# Patient Record
Sex: Male | Born: 1970 | Race: Black or African American | Hispanic: No | Marital: Single | Smoking: Never smoker
Health system: Southern US, Community
[De-identification: ages and names within clinical notes are randomized; demographics above are authoritative.]

## PROBLEM LIST (undated history)

## (undated) DIAGNOSIS — N189 Chronic kidney disease, unspecified: Secondary | ICD-10-CM

## (undated) DIAGNOSIS — I10 Essential (primary) hypertension: Secondary | ICD-10-CM

## (undated) DIAGNOSIS — N289 Disorder of kidney and ureter, unspecified: Secondary | ICD-10-CM

## (undated) HISTORY — PX: HERNIA REPAIR: SHX51

---

## 2006-06-18 ENCOUNTER — Emergency Department (HOSPITAL_COMMUNITY): Admission: EM | Admit: 2006-06-18 | Discharge: 2006-06-18 | Payer: Self-pay | Admitting: Emergency Medicine

## 2020-03-14 ENCOUNTER — Emergency Department (HOSPITAL_COMMUNITY): Payer: 59

## 2020-03-14 ENCOUNTER — Other Ambulatory Visit: Payer: Self-pay

## 2020-03-14 ENCOUNTER — Inpatient Hospital Stay (HOSPITAL_COMMUNITY)
Admission: EM | Admit: 2020-03-14 | Discharge: 2020-03-17 | DRG: 177 | Disposition: A | Discharge status: Home / Self Care | Payer: 59 | Attending: Family Medicine | Admitting: Family Medicine

## 2020-03-14 ENCOUNTER — Encounter (HOSPITAL_COMMUNITY): Payer: Self-pay | Admitting: Emergency Medicine

## 2020-03-14 DIAGNOSIS — U071 COVID-19: Principal | ICD-10-CM

## 2020-03-14 DIAGNOSIS — R0902 Hypoxemia: Secondary | ICD-10-CM

## 2020-03-14 DIAGNOSIS — R490 Dysphonia: Secondary | ICD-10-CM | POA: Diagnosis present

## 2020-03-14 DIAGNOSIS — J1282 Pneumonia due to coronavirus disease 2019: Secondary | ICD-10-CM | POA: Diagnosis not present

## 2020-03-14 DIAGNOSIS — Z8249 Family history of ischemic heart disease and other diseases of the circulatory system: Secondary | ICD-10-CM | POA: Diagnosis not present

## 2020-03-14 DIAGNOSIS — J9601 Acute respiratory failure with hypoxia: Secondary | ICD-10-CM | POA: Diagnosis present

## 2020-03-14 DIAGNOSIS — I10 Essential (primary) hypertension: Secondary | ICD-10-CM | POA: Diagnosis present

## 2020-03-14 DIAGNOSIS — N179 Acute kidney failure, unspecified: Secondary | ICD-10-CM | POA: Diagnosis present

## 2020-03-14 DIAGNOSIS — J069 Acute upper respiratory infection, unspecified: Secondary | ICD-10-CM | POA: Diagnosis not present

## 2020-03-14 HISTORY — DX: Pneumonia due to coronavirus disease 2019: J12.82

## 2020-03-14 HISTORY — DX: COVID-19: U07.1

## 2020-03-14 LAB — COMPREHENSIVE METABOLIC PANEL
ALT: 58 U/L — ABNORMAL HIGH (ref 0–44)
AST: 42 U/L — ABNORMAL HIGH (ref 15–41)
Albumin: 3.3 g/dL — ABNORMAL LOW (ref 3.5–5.0)
Alkaline Phosphatase: 39 U/L (ref 38–126)
Anion gap: 11 (ref 5–15)
BUN: 24 mg/dL — ABNORMAL HIGH (ref 6–20)
CO2: 25 mmol/L (ref 22–32)
Calcium: 8.6 mg/dL — ABNORMAL LOW (ref 8.9–10.3)
Chloride: 98 mmol/L (ref 98–111)
Creatinine, Ser: 1.41 mg/dL — ABNORMAL HIGH (ref 0.61–1.24)
GFR, Estimated: >60 mL/min (ref >60)
Glucose, Bld: 114 mg/dL — ABNORMAL HIGH (ref 70–99)
Potassium: 3.9 mmol/L (ref 3.5–5.1)
Sodium: 134 mmol/L — ABNORMAL LOW (ref 135–145)
Total Bilirubin: 0.7 mg/dL (ref 0.3–1.2)
Total Protein: 7.4 g/dL (ref 6.5–8.1)

## 2020-03-14 LAB — CBC WITH DIFFERENTIAL/PLATELET
Basophils Absolute: 0.1 10*3/uL (ref 0.0–0.1)
Basophils Relative: 1 %
Eosinophils Absolute: 0 10*3/uL (ref 0.0–0.5)
Eosinophils Relative: 0 %
HCT: 46 % (ref 39.0–52.0)
Hemoglobin: 14.9 g/dL (ref 13.0–17.0)
Lymphocytes Relative: 14 %
Lymphs Abs: 1.6 10*3/uL (ref 0.7–4.0)
MCH: 28.9 pg (ref 26.0–34.0)
MCHC: 32.4 g/dL (ref 30.0–36.0)
MCV: 89.1 fL (ref 80.0–100.0)
Monocytes Absolute: 0.7 10*3/uL (ref 0.1–1.0)
Monocytes Relative: 6 %
Neutro Abs: 8.4 10*3/uL — ABNORMAL HIGH (ref 1.7–7.7)
Neutrophils Relative %: 77 %
Platelets: 259 10*3/uL (ref 150–400)
RBC: 5.16 MIL/uL (ref 4.22–5.81)
RDW: 12.7 % (ref 11.5–15.5)
WBC: 10.9 10*3/uL — ABNORMAL HIGH (ref 4.0–10.5)
nRBC: 0 % (ref 0.0–0.2)
nRBC: 0 /100 WBC

## 2020-03-14 LAB — FERRITIN: Ferritin: 834 ng/mL — ABNORMAL HIGH (ref 24–336)

## 2020-03-14 LAB — PROCALCITONIN: Procalcitonin: <0.1 ng/mL

## 2020-03-14 LAB — C-REACTIVE PROTEIN: CRP: 8.1 mg/dL — ABNORMAL HIGH (ref <1.0)

## 2020-03-14 LAB — BRAIN NATRIURETIC PEPTIDE: B Natriuretic Peptide: 116 pg/mL — ABNORMAL HIGH (ref 0.0–100.0)

## 2020-03-14 LAB — TRIGLYCERIDES: Triglycerides: 105 mg/dL (ref <150)

## 2020-03-14 LAB — RESP PANEL BY RT-PCR (FLU A&B, COVID) ARPGX2
Influenza A by PCR: NEGATIVE
Influenza B by PCR: NEGATIVE
SARS Coronavirus 2 by RT PCR: POSITIVE — AB

## 2020-03-14 LAB — LACTATE DEHYDROGENASE: LDH: 440 U/L — ABNORMAL HIGH (ref 98–192)

## 2020-03-14 LAB — FIBRINOGEN: Fibrinogen: >800 mg/dL — ABNORMAL HIGH (ref 210–475)

## 2020-03-14 LAB — LACTIC ACID, PLASMA: Lactic Acid, Venous: 0.9 mmol/L (ref 0.5–1.9)

## 2020-03-14 LAB — D-DIMER, QUANTITATIVE (NOT AT ARMC): D-Dimer, Quant: 2.86 ug/mL-FEU — ABNORMAL HIGH (ref 0.00–0.50)

## 2020-03-14 MED ORDER — ONDANSETRON HCL 4 MG/2ML IJ SOLN: 4.0000 mg | Freq: Four times a day (QID) | INTRAMUSCULAR | Status: DC | PRN

## 2020-03-14 MED ORDER — GUAIFENESIN-DM 100-10 MG/5ML PO SYRP
10.0000 mL | ORAL_SOLUTION | ORAL | Status: DC | PRN
  Administered 2020-03-14: 10 mL via ORAL
  Filled 2020-03-14: qty 10

## 2020-03-14 MED ORDER — ONDANSETRON HCL 4 MG PO TABS: 4.0000 mg | ORAL_TABLET | Freq: Four times a day (QID) | ORAL | Status: DC | PRN

## 2020-03-14 MED ORDER — SODIUM CHLORIDE 0.9 % IV SOLN
100.0000 mg | INTRAVENOUS | Status: AC
  Administered 2020-03-14 (×2): 100 mg via INTRAVENOUS
  Filled 2020-03-14 (×2): qty 20

## 2020-03-14 MED ORDER — SODIUM CHLORIDE 0.9 % IV SOLN
100.0000 mg | Freq: Every day | INTRAVENOUS | Status: DC
  Administered 2020-03-15 – 2020-03-17 (×3): 100 mg via INTRAVENOUS
  Filled 2020-03-14 (×3): qty 20

## 2020-03-14 MED ORDER — ZINC SULFATE 220 (50 ZN) MG PO CAPS
220.0000 mg | ORAL_CAPSULE | Freq: Every day | ORAL | Status: DC
  Administered 2020-03-14 – 2020-03-17 (×4): 220 mg via ORAL
  Filled 2020-03-14 (×4): qty 1

## 2020-03-14 MED ORDER — ASCORBIC ACID 500 MG PO TABS
500.0000 mg | ORAL_TABLET | Freq: Every day | ORAL | Status: DC
  Administered 2020-03-14 – 2020-03-17 (×4): 500 mg via ORAL
  Filled 2020-03-14 (×4): qty 1

## 2020-03-14 MED ORDER — ENOXAPARIN SODIUM 40 MG/0.4ML ~~LOC~~ SOLN
40.0000 mg | SUBCUTANEOUS | Status: DC
  Administered 2020-03-14 – 2020-03-16 (×3): 40 mg via SUBCUTANEOUS
  Filled 2020-03-14 (×3): qty 0.4

## 2020-03-14 MED ORDER — METHYLPREDNISOLONE SODIUM SUCC 40 MG IJ SOLR
1.0000 mg/kg | Freq: Once | INTRAMUSCULAR | Status: AC
  Administered 2020-03-14: 104 mg via INTRAVENOUS
  Filled 2020-03-14: qty 3

## 2020-03-14 MED ORDER — DEXAMETHASONE SODIUM PHOSPHATE 10 MG/ML IJ SOLN
6.0000 mg | INTRAMUSCULAR | Status: DC
  Administered 2020-03-15 – 2020-03-17 (×3): 6 mg via INTRAVENOUS
  Filled 2020-03-14 (×3): qty 1

## 2020-03-14 MED ORDER — ALBUTEROL SULFATE HFA 108 (90 BASE) MCG/ACT IN AERS
2.0000 | INHALATION_SPRAY | Freq: Four times a day (QID) | RESPIRATORY_TRACT | Status: DC
  Administered 2020-03-14 – 2020-03-16 (×9): 2 via RESPIRATORY_TRACT
  Filled 2020-03-14: qty 6.7

## 2020-03-14 MED ORDER — POLYETHYLENE GLYCOL 3350 17 G PO PACK: 17.0000 g | PACK | Freq: Every day | ORAL | Status: DC | PRN

## 2020-03-14 NOTE — ED Provider Notes (Signed)
[facility] EMERGENCY DEPARTMENT Provider Note   CSN: [id number] Arrival date & time: 03/14/20  0900     History Chief Complaint  Patient presents with  . Shortness of Breath    Tony Meyer is a 49 y.o. male presenting for evaluation of shortness of breath.  Patient states for the past week, he has had gradually worsening shortness of breath.  He is not short of breath over time, significantly worse with exertion.  He reports feeling that his mouth is dry and that his voice is hoarse.  Initially he had chills, but these have resolved.  No fevers, chest pain, nausea, vomiting, abd pain, urinary symptoms, normal bowel movements.  He reports a mild cough, productive of yellow and occasionally bloody sputum.  He denies recent travel, surgeries, immobilization, history of cancer, history previous DVT/PE, or hormone use.  He was seen at urgent care 3 days ago, prescribed prednisone, albuterol, and antibiotic.  Has been taking it for 2 days with continued worsening symptoms.  He has no medical problems, takes no medications daily.  No history of asthma or COPD.  He does not smoke cigarettes, denies alcohol or drug use.  He did not receive his Covid vaccines.  He denies sick contacts.  HPI     History reviewed. No pertinent past medical history.  There are no problems to display for this patient.   Past Surgical History:  Procedure Laterality Date  . HERNIA REPAIR         History reviewed. No pertinent family history.  Social History   Tobacco Use  . Smoking status: Never Smoker  . Smokeless tobacco: Never Used  Vaping Use  . Vaping Use: Never used  Substance Use Topics  . Alcohol use: Never  . Drug use: Never    Home Medications Prior to Admission medications   Not on File    Allergies    Patient has no allergy information on record.  Review of Systems   Review of Systems  Respiratory: Positive for cough and shortness of breath.   All other systems reviewed  and are negative.   Physical Exam Updated Vital Signs BP (!) 164/113   Pulse 91   Temp 97.8 F (36.6 C) (Oral)   Resp (!) 40   Ht 6\' 2"  (1.88 m)   Wt 103.7 kg   SpO2 94%   BMI 29.36 kg/m   Physical Exam Vitals and nursing note reviewed.  Constitutional:      General: He is not in acute distress.    Appearance: He is well-developed. He is ill-appearing.     Comments: Appears as if he feels ill, not in acute distress  HENT:     Head: Normocephalic and atraumatic.     Comments: OP erythematous without tonsillar swelling or exudate.  Uvula midline. Eyes:     Extraocular Movements: Extraocular movements intact.     Conjunctiva/sclera: Conjunctivae normal.     Pupils: Pupils are equal, round, and reactive to light.  Cardiovascular:     Rate and Rhythm: Regular rhythm. Tachycardia present.     Pulses: Normal pulses.     Comments: Tachycardic between 105 and 110 Pulmonary:     Effort: Tachypnea present. No respiratory distress.     Breath sounds: Rales present. No wheezing.     Comments: Tachypneic around 30.  SPO2 in the mid to low 90s on 4 L via nasal cannula.  Scattered rails in lower lobes. Abdominal:     General: There is  no distension.     Palpations: Abdomen is soft. There is no mass.     Tenderness: There is no abdominal tenderness. There is no guarding or rebound.  Musculoskeletal:        General: Normal range of motion.     Cervical back: Normal range of motion and neck supple.     Right lower leg: No edema.     Left lower leg: No edema.  Skin:    General: Skin is warm and dry.     Capillary Refill: Capillary refill takes less than 2 seconds.  Neurological:     Mental Status: He is alert and oriented to person, place, and time.     ED Results / Procedures / Treatments   Labs (all labs ordered are listed, but only abnormal results are displayed) Labs Reviewed  RESP PANEL BY RT-PCR (FLU A&B, COVID) ARPGX2 - Abnormal; Notable for the following components:       Result Value   SARS Coronavirus 2 by RT PCR POSITIVE (*)    All other components within normal limits  CBC WITH DIFFERENTIAL/PLATELET - Abnormal; Notable for the following components:   WBC 10.9 (*)    Neutro Abs 8.4 (*)    All other components within normal limits  COMPREHENSIVE METABOLIC PANEL - Abnormal; Notable for the following components:   Sodium 134 (*)    Glucose, Bld 114 (*)    BUN 24 (*)    Creatinine, Ser 1.41 (*)    Calcium 8.6 (*)    Albumin 3.3 (*)    AST 42 (*)    ALT 58 (*)    All other components within normal limits  D-DIMER, QUANTITATIVE (NOT AT [facility]) - Abnormal; Notable for the following components:   D-Dimer, Quant 2.86 (*)    All other components within normal limits  BRAIN NATRIURETIC PEPTIDE - Abnormal; Notable for the following components:   B Natriuretic Peptide 116.0 (*)    All other components within normal limits  CULTURE, BLOOD (ROUTINE X 2)  CULTURE, BLOOD (ROUTINE X 2)  LACTIC ACID, PLASMA  LACTIC ACID, PLASMA  PROCALCITONIN  LACTATE DEHYDROGENASE  FERRITIN  TRIGLYCERIDES  FIBRINOGEN  C-REACTIVE PROTEIN    EKG EKG Interpretation  Date/Time:  Tuesday March 14 2020 11:11:29 EST Ventricular Rate:  96 PR Interval:  176 QRS Duration: 84 QT Interval:  342 QTC Calculation: 432 R Axis:   81 Text Interpretation: Normal sinus rhythm Normal ECG No previous ECGs available Confirmed by Vanetta Mulders [phone]) on 03/14/2020 1:03:37 PM   Radiology DG Chest Portable 1 View  Result Date: 03/14/2020 CLINICAL DATA:  Shortness of breath EXAM: PORTABLE CHEST 1 VIEW COMPARISON:  None. FINDINGS: Patchy bilateral opacities with basilar predominance. No pleural effusion or pneumothorax. Cardiomediastinal contours are within normal limits for portable technique. IMPRESSION: Patchy bilateral pulmonary opacities, which may reflect edema or pneumonia. Electronically Signed   By: Guadlupe Spanish M.D.   On: 03/14/2020 12:31    Procedures Procedures  (including critical care time)  Medications Ordered in ED Medications  methylPREDNISolone sodium succinate (SOLU-MEDROL) 40 mg/mL injection 104 mg (has no administration in time range)    ED Course  I have reviewed the triage vital signs and the nursing notes.  Pertinent labs & imaging results that were available during my care of the patient were reviewed by me and considered in my medical decision making (see chart for details).    MDM Rules/Calculators/A&P  Patient resenting for evaluation of shortness of breath.  On exam, patient appears as if he feels ill, but is not in acute distress.  However, he was hypoxic upon arrival to the ED with SPO2 of 88%.  This is improved with oxygen.  Concern for infection such as pneumonia.  Consider viral illness including Covid despite patient's negative test a few days ago.  Consider PE, but patient without risk factors.  As he is currently hypoxic, he will likely need admission.  Will obtain labs and chest x-ray, he will likely need a CTA as well.   Labs interpreted by me, mild leukocytosis of 10.9.  However patient is afebrile, doubt secondary bacterial infection at this time.  Chest x-ray viewed interpreted by me, consistent with bilateral pneumonia.  Most likely viral, concern for Covid.  Dimer elevated at 2.86.  This could also be related to Covid, but also consider PE.  He will need a CTA.  Covid test is positive.  Discussed results with patient, will give Solu-Medrol at 1 mg/kg.  CTA pending.  As patient continues to need oxygen, he will need to be admitted.  He is no longer tachycardic nor is tachypneic at this time after being on oxygen for several hours.  Discussed with Dr. Mariea Clonts from [facility], pt to be admitted.   Final Clinical Impression(s) / ED Diagnoses Final diagnoses:  COVID-19  Pneumonia due to COVID-19 virus  Hypoxia    Rx / DC Orders ED Discharge Orders    None       Alveria Apley, PA-C 03/14/20 1541    Vanetta Mulders, MD 03/15/20 [phone]

## 2020-03-14 NOTE — H&P (Signed)
History and Physical    Tony Meyer DOB: 11-14-1970 DOA: 03/14/2020  PCP: Patient, No Pcp Per   Patient coming from: Home  I have personally briefly reviewed patient's old medical records in [facility]  Chief Complaint: Difficulty breathing  HPI: Tony Meyer is a 49 y.o. male with  No significant medical history.  Presented to the ED with complaints of difficulty breathing, productive cough of 1 week duration.  Patient has not been vaccinated for Covid.  He was seen at an urgent care about 3 days ago and was prescribed prednisone albuterol and antibiotics, which he reports taking for 2 days, but with persistence and worsening of symptoms he presented to the ED. He denies chest pain, no lower extremity swelling.   ED Course: O2 sats 87% on room air, placed on 2 L O2 via nasal cannula.  Chest x-ray showing bilateral patchy opacities edema or pneumonia.  Creatinine mildly elevated 1.4.  D-dimer elevated 2.86.Marland Kitchen  Blood cultures obtained.  CTA ordered in the ED.  Hospitalist admit for Covid pneumonia.  Review of Systems: As per HPI all other systems reviewed and negative.  History reviewed. No pertinent past medical history.  Past Surgical History:  Procedure Laterality Date  . HERNIA REPAIR       reports that he has never smoked. He has never used smokeless tobacco. He reports that he does not drink alcohol and does not use drugs.  Not on File  Family history of hypertension.  Prior to Admission medications   Not on File    Physical Exam: Vitals:   03/14/20 1415 03/14/20 1430 03/14/20 1445 03/14/20 1500  BP:  (!) 153/114  (!) 164/113  Pulse: 85 82 87 91  Resp: (!) 33 (!) 34 (!) 38 (!) 40  Temp:      TempSrc:      SpO2: 95% 95% 95% 94%  Weight:      Height:        Constitutional: NAD, calm, comfortable Vitals:   03/14/20 1415 03/14/20 1430 03/14/20 1445 03/14/20 1500  BP:  (!) 153/114  (!) 164/113  Pulse: 85 82 87 91  Resp: (!) 33 (!) 34  (!) 38 (!) 40  Temp:      TempSrc:      SpO2: 95% 95% 95% 94%  Weight:      Height:       Eyes: PERRL, lids and conjunctivae normal ENMT: Mucous membranes are moist. Neck: normal, supple, no masses, no thyromegaly Respiratory:  Normal respiratory effort. No accessory muscle use.  Cardiovascular: Regular rate and rhythm, no murmurs / rubs / gallops. No extremity edema. 2+ pedal pulses.   Abdomen: no tenderness, no masses palpated. No hepatosplenomegaly. Bowel sounds positive.  Musculoskeletal: no clubbing / cyanosis. No joint deformity upper and lower extremities. Good ROM, no contractures. Normal muscle tone.  Skin: no rashes, lesions, ulcers. No induration Neurologic: No apparent cranial commands, moving extremities spontaneously. Psychiatric: Normal judgment and insight. Alert and oriented x 3. Normal mood.   Labs on Admission: I have personally reviewed following labs and imaging studies  CBC: Recent Labs  Lab 03/14/20 1242  WBC 10.9*  NEUTROABS 8.4*  HGB 14.9  HCT 46.0  MCV 89.1  PLT 259   Basic Metabolic Panel: Recent Labs  Lab 03/14/20 1242  NA 134*  K 3.9  CL 98  CO2 25  GLUCOSE 114*  BUN 24*  CREATININE 1.41*  CALCIUM 8.6*   Liver Function Tests: Recent Labs  Lab 03/14/20 1242  AST 42*  ALT 58*  ALKPHOS 39  BILITOT 0.7  PROT 7.4  ALBUMIN 3.3*    Radiological Exams on Admission: DG Chest Portable 1 View  Result Date: 03/14/2020 CLINICAL DATA:  Shortness of breath EXAM: PORTABLE CHEST 1 VIEW COMPARISON:  None. FINDINGS: Patchy bilateral opacities with basilar predominance. No pleural effusion or pneumothorax. Cardiomediastinal contours are within normal limits for portable technique. IMPRESSION: Patchy bilateral pulmonary opacities, which may reflect edema or pneumonia. Electronically Signed   By: Guadlupe Spanish M.D.   On: 03/14/2020 12:31    EKG: Independently reviewed. Sinus rhythm, rate 96, prominent T waves, ? LVH  Assessment/Plan Active  Problems:   Pneumonia due to COVID-19 virus   Pneumonia due to COVID-19 virus with acute respiratory failure, presenting with dyspnea, cough hypoxia O2 sats 87% on room air, presently on 4 L.  Chest x-ray showing patchy bilateral opacities edema or pneumonia.  D-dimer 2.86. -COVID-19 admission protocol,  -Supplemental O2, mucolytic's -Remdesivir pharmacy to dose -Weight-based dosing of IV Solu-Medrol started in the ED 104 mg given, continue Decadron 6 mg daily -At this time doubt need for CTA, will cancel, considering possible renal insufficiency. -Multivitamins  Possible AKI- Cr 1.4.  Likely prerenal.  Unknown baseline, denies medical conditions. -Hydrate N/s 100cc/hr x 20hrs  Elevated blood pressure likely hypertensive.  Blood pressure systolic 150s to 696E. -As needed labetalol for systolic greater than 170. -May need antihypertensive agents on discharge.  DVT prophylaxis: Lovenox Code Status: Full code Family Communication: None at bedside Disposition Plan:  ~ 2 days, pending improvement in respiratory status. Consults called: None Admission status:  Inpt, tele I certify that at the point of admission it is my clinical judgment that the patient will require inpatient hospital care spanning beyond 2 midnights from the point of admission due to high intensity of service, high risk for further deterioration and high frequency of surveillance required. The following factors support the patient status of inpatient: COVID-19 pneumonia with respiratory failure requiring IV medications and supplemental oxygen.   Onnie Boer MD [facility]  03/14/2020, 4:32 PM

## 2020-03-14 NOTE — Progress Notes (Signed)
Pt arrived to room #336 via WC from ED. Pt ambulatory to bed without difficulty. O2 on at 3lpm [place], SaO2 94%. Tele applied, SR 90's. Pt denies any c/o SOB at present. Skin warm and dry, color appropriate for ethnicity. IV site WNL, flushed easily with saline, no s/s infiltration. Pt oriented to room and safety precautions, verbalizes understanding. B/P remains elevated with diastolic > 115. Pt given regular diet tray and oral fluids per his request.

## 2020-03-14 NOTE — ED Triage Notes (Addendum)
Pt complains of shortness of breath for thew past week. Pt was seen at [facility] and prescribed an albuterol, prednisone and antibiotic. Pt states they are not helping and he has not taken them today. Pt tested negative for Covid and Flu A/B at the [facility] on 03/12/20.  Pt had an oxygen saturation of 88% on Room Air. Pt placed on 4L O2 [place] and sats are 94%

## 2020-03-15 DIAGNOSIS — N179 Acute kidney failure, unspecified: Secondary | ICD-10-CM | POA: Diagnosis present

## 2020-03-15 DIAGNOSIS — R0902 Hypoxemia: Secondary | ICD-10-CM

## 2020-03-15 DIAGNOSIS — I10 Essential (primary) hypertension: Secondary | ICD-10-CM | POA: Diagnosis present

## 2020-03-15 DIAGNOSIS — U071 COVID-19: Secondary | ICD-10-CM

## 2020-03-15 HISTORY — DX: Essential (primary) hypertension: I10

## 2020-03-15 HISTORY — DX: Acute kidney failure, unspecified: N17.9

## 2020-03-15 HISTORY — DX: COVID-19: U07.1

## 2020-03-15 LAB — HIV ANTIBODY (ROUTINE TESTING W REFLEX): HIV Screen 4th Generation wRfx: NONREACTIVE

## 2020-03-15 MED ORDER — LABETALOL HCL 5 MG/ML IV SOLN
10.0000 mg | INTRAVENOUS | Status: DC | PRN
  Administered 2020-03-15: 10 mg via INTRAVENOUS
  Filled 2020-03-15: qty 4

## 2020-03-15 MED ORDER — SODIUM CHLORIDE 0.9 % IV SOLN: INTRAVENOUS | Status: DC

## 2020-03-15 MED ORDER — METOPROLOL TARTRATE 25 MG PO TABS
25.0000 mg | ORAL_TABLET | Freq: Two times a day (BID) | ORAL | Status: DC
  Administered 2020-03-16: 25 mg via ORAL
  Filled 2020-03-15: qty 1

## 2020-03-15 MED ORDER — AMLODIPINE BESYLATE 5 MG PO TABS
5.0000 mg | ORAL_TABLET | Freq: Every day | ORAL | Status: DC
  Administered 2020-03-15 – 2020-03-17 (×3): 5 mg via ORAL
  Filled 2020-03-15 (×2): qty 1

## 2020-03-15 MED ORDER — BARICITINIB 2 MG PO TABS
4.0000 mg | ORAL_TABLET | Freq: Every day | ORAL | Status: DC
  Administered 2020-03-15 – 2020-03-17 (×3): 4 mg via ORAL
  Filled 2020-03-15 (×3): qty 2

## 2020-03-15 NOTE — Progress Notes (Signed)
PROGRESS NOTE   Tony Meyer  WPY:[id number] DOB: 1970/08/09 DOA: 03/14/2020 PCP: Patient, No Pcp Per   Chief Complaint  Patient presents with  . Shortness of Breath    Brief Admission History:  50 y.o. male with  No significant medical history.  Presented to the ED with complaints of difficulty breathing, productive cough of 1 week duration.  Patient has not been vaccinated for Covid.  He was seen at an urgent care about 3 days ago and was prescribed prednisone albuterol and antibiotics, which he reports taking for 2 days, but with persistence and worsening of symptoms he presented to the ED. He denies chest pain, no lower extremity swelling.   ED Course: O2 sats 87% on room air, placed on 2 L O2 via nasal cannula.  Chest x-ray showing bilateral patchy opacities edema or pneumonia.  Creatinine mildly elevated 1.4.  D-dimer elevated 2.86.Marland Kitchen  Blood cultures obtained.  CTA ordered in the ED.  Hospitalist admit for Covid pneumonia.   Assessment & Plan:   Active Problems:   Pneumonia due to COVID-19 virus   Hypoxia   Essential hypertension   AKI (acute kidney injury) (HCC)   1. Covid Pneumonia - Pt unfortunately is having increasing oxygen requirement and therefore I am concerned about parenchymal lung damage.  I have added baricitinib to his regimen dose per pharmacy and continue remdesivir and steroids as ordered and supplemental supportive measures.  Continue to follow inflammatory markers closely. 2. AKI-presuming this is a prerenal insult and treating with gentle IV fluid hydration follow renal function closely and recheck in a.m.  Renally dose medications. 3. Essential hypertension-blood pressure is elevated and will add amlodipine 5 mg daily.  DVT prophylaxis: enoxaparin  Code Status: full  Family Communication: patient  Disposition: home   Status is: Inpatient  Remains inpatient appropriate because:IV treatments appropriate due to intensity of illness or inability to take  PO and Inpatient level of care appropriate due to severity of illness   Dispo: The patient is from: Home              Anticipated d/c is to: Home              Anticipated d/c date is: 2 days              Patient currently is not medically stable to d/c.   Consultants:     Procedures:     Antimicrobials:  Remdesivir 11/30>>   Subjective: Pt reports that he continues to feel short of breath.  He has nonproductive cough.   Objective: Vitals:   03/15/20 1126 03/15/20 1236 03/15/20 1238 03/15/20 1509  BP: (!) 169/116 (!) 160/121 (!) 161/111   Pulse: 93     Resp: (!) 22     Temp: 97.7 F (36.5 C)     TempSrc: Oral     SpO2: 93%   94%  Weight:      Height:        Intake/Output Summary (Last 24 hours) at 03/15/2020 1810 Last data filed at 03/15/2020 1700 Gross per 24 hour  Intake 970 ml  Output --  Net 970 ml   Filed Weights   03/14/20 1113 03/14/20 1854  Weight: 103.7 kg 100.7 kg    Examination:  General exam: Appears calm and comfortable  Respiratory system: Clear to auscultation. Respiratory effort normal. Cardiovascular system: S1 & S2 heard, RRR. No JVD, murmurs, rubs, gallops or clicks. No pedal edema. Gastrointestinal system: Abdomen is nondistended, soft and  nontender. No organomegaly or masses felt. Normal bowel sounds heard. Central nervous system: Alert and oriented. No focal neurological deficits. Extremities: Symmetric 5 x 5 power. Skin: No rashes, lesions or ulcers Psychiatry: Judgement and insight appear normal. Mood & affect appropriate.   Data Reviewed: I have personally reviewed following labs and imaging studies  CBC: Recent Labs  Lab 03/14/20 1242  WBC 10.9*  NEUTROABS 8.4*  HGB 14.9  HCT 46.0  MCV 89.1  PLT 259    Basic Metabolic Panel: Recent Labs  Lab 03/14/20 1242  NA 134*  K 3.9  CL 98  CO2 25  GLUCOSE 114*  BUN 24*  CREATININE 1.41*  CALCIUM 8.6*    GFR: Estimated Creatinine Clearance: 78.6 mL/min (A) (by C-G  formula based on SCr of 1.41 mg/dL (H)).  Liver Function Tests: Recent Labs  Lab 03/14/20 1242  AST 42*  ALT 58*  ALKPHOS 39  BILITOT 0.7  PROT 7.4  ALBUMIN 3.3*    CBG: No results for input(s): GLUCAP in the last 168 hours.  Recent Results (from the past 240 hour(s))  Resp Panel by RT-PCR (Flu A&B, Covid) Nasopharyngeal Swab     Status: Abnormal   Collection Time: 03/14/20 12:56 PM   Specimen: Nasopharyngeal Swab; Nasopharyngeal(NP) swabs in vial transport medium  Result Value Ref Range Status   SARS Coronavirus 2 by RT PCR POSITIVE (A) NEGATIVE Final    Comment: RESULT CALLED TO, READ BACK BY AND VERIFIED WITH: HOLCOMB,R. AT 1434 ON 03/14/2020 BY BAUGHAM,M. (NOTE) SARS-CoV-2 target nucleic acids are DETECTED.  The SARS-CoV-2 RNA is generally detectable in upper respiratory specimens during the acute phase of infection. Positive results are indicative of the presence of the identified virus, but do not rule out bacterial infection or co-infection with other pathogens not detected by the test. Clinical correlation with patient history and other diagnostic information is necessary to determine patient infection status. The expected result is Negative.  Fact Sheet for Patients: BloggerCourse.com  Fact Sheet for Healthcare Providers: SeriousBroker.it  This test is not yet approved or cleared by the [place] FDA and  has been authorized for detection and/or diagnosis of SARS-CoV-2 by FDA under an Emergency Use Authorization (EUA).  This EUA will remain in effect (meaning thi s test can be used) for the duration of  the COVID-19 declaration under Section 564(b)(1) of the Act, 21 U.S.C. section 360bbb-3(b)(1), unless the authorization is terminated or revoked sooner.     Influenza A by PCR NEGATIVE NEGATIVE Final   Influenza B by PCR NEGATIVE NEGATIVE Final    Comment: (NOTE) The Xpert Xpress SARS-CoV-2/FLU/RSV plus  assay is intended as an aid in the diagnosis of influenza from Nasopharyngeal swab specimens and should not be used as a sole basis for treatment. Nasal washings and aspirates are unacceptable for Xpert Xpress SARS-CoV-2/FLU/RSV testing.  Fact Sheet for Patients: BloggerCourse.com  Fact Sheet for Healthcare Providers: SeriousBroker.it  This test is not yet approved or cleared by the [place] FDA and has been authorized for detection and/or diagnosis of SARS-CoV-2 by FDA under an Emergency Use Authorization (EUA). This EUA will remain in effect (meaning this test can be used) for the duration of the COVID-19 declaration under Section 564(b)(1) of the Act, 21 U.S.C. section 360bbb-3(b)(1), unless the authorization is terminated or revoked.  Performed at [facility], [address] [place], [place] [address]   Blood Culture (routine x 2)     Status: None (Preliminary result)   Collection Time: 03/14/20  3:17 PM   Specimen: BLOOD  Result Value Ref Range Status   Specimen Description BLOOD RIGHT ANTECUBITAL  Final   Special Requests   Final    BOTTLES DRAWN AEROBIC AND ANAEROBIC Blood Culture adequate volume Performed at [facility], [address] [place], [place] [address]    Culture PENDING  Incomplete   Report Status PENDING  Incomplete  Blood Culture (routine x 2)     Status: None (Preliminary result)   Collection Time: 03/14/20  3:17 PM   Specimen: BLOOD  Result Value Ref Range Status   Specimen Description BLOOD LEFT ANTECUBITAL  Final   Special Requests   Final    BOTTLES DRAWN AEROBIC AND ANAEROBIC Blood Culture results may not be optimal due to an inadequate volume of blood received in culture bottles Performed at [facility], [address], [place], [place] [address]    Culture PENDING  Incomplete   Report Status PENDING  Incomplete     Radiology Studies: DG Chest Portable 1 View  Result Date:  03/14/2020 CLINICAL DATA:  Shortness of breath EXAM: PORTABLE CHEST 1 VIEW COMPARISON:  None. FINDINGS: Patchy bilateral opacities with basilar predominance. No pleural effusion or pneumothorax. Cardiomediastinal contours are within normal limits for portable technique. IMPRESSION: Patchy bilateral pulmonary opacities, which may reflect edema or pneumonia. Electronically Signed   By: Guadlupe Spanish M.D.   On: 03/14/2020 12:31   Scheduled Meds: . albuterol  2 puff Inhalation Q6H  . vitamin C  500 mg Oral Daily  . baricitinib  4 mg Oral Daily  . dexamethasone (DECADRON) injection  6 mg Intravenous Q24H  . enoxaparin (LOVENOX) injection  40 mg Subcutaneous Q24H  . zinc sulfate  220 mg Oral Daily   Continuous Infusions: . sodium chloride 70 mL/hr at 03/15/20 0922  . remdesivir 100 mg in NS 100 mL 100 mg (03/15/20 0926)     LOS: 1 day   Time spent: 36 mins    Janiqua Friscia Laural Benes, MD How to contact the [facility] Attending or Consulting provider 7A - 7P or covering provider during after hours 7P -7A, for this patient?  1. Check the care team in [facility] and look for a) attending/consulting [facility] provider listed and b) the [facility] team listed 2. Log into [email or web address] and use [place]'s universal password to access. If you do not have the password, please contact the hospital operator. 3. Locate the [facility] provider you are looking for under [facility] and page to a number that you can be directly reached. 4. If you still have difficulty reaching the provider, please page the [facility] (Director on Call) for the Hospitalists listed on amion for assistance.  03/15/2020, 6:10 PM

## 2020-03-15 NOTE — Plan of Care (Signed)
  Problem: Education: Goal: Knowledge of risk factors and measures for prevention of condition will improve Outcome: Progressing   Problem: Coping: Goal: Psychosocial and spiritual needs will be supported Outcome: Progressing   Problem: Respiratory: Goal: Will maintain a patent airway Outcome: Progressing Goal: Complications related to the disease process, condition or treatment will be avoided or minimized Outcome: Progressing   Problem: Education: Goal: Knowledge of General Education information will improve Description: Including pain rating scale, medication(s)/side effects and non-pharmacologic comfort measures Outcome: Progressing   Problem: Clinical Measurements: Goal: Ability to maintain clinical measurements within normal limits will improve Outcome: Progressing Goal: Respiratory complications will improve Outcome: Progressing Goal: Cardiovascular complication will be avoided Outcome: Progressing   Problem: Activity: Goal: Risk for activity intolerance will decrease Outcome: Progressing   Problem: Coping: Goal: Level of anxiety will decrease Outcome: Progressing   Problem: Elimination: Goal: Will not experience complications related to bowel motility Outcome: Progressing Goal: Will not experience complications related to urinary retention Outcome: Progressing   Problem: Pain Managment: Goal: General experience of comfort will improve Outcome: Progressing   Problem: Safety: Goal: Ability to remain free from injury will improve Outcome: Progressing   Problem: Skin Integrity: Goal: Risk for impaired skin integrity will decrease Outcome: Progressing   

## 2020-03-16 LAB — PHOSPHORUS: Phosphorus: 3.6 mg/dL (ref 2.5–4.6)

## 2020-03-16 LAB — COMPREHENSIVE METABOLIC PANEL
ALT: 47 U/L — ABNORMAL HIGH (ref 0–44)
AST: 28 U/L (ref 15–41)
Albumin: 2.7 g/dL — ABNORMAL LOW (ref 3.5–5.0)
Alkaline Phosphatase: 37 U/L — ABNORMAL LOW (ref 38–126)
Anion gap: 10 (ref 5–15)
BUN: 26 mg/dL — ABNORMAL HIGH (ref 6–20)
CO2: 22 mmol/L (ref 22–32)
Calcium: 8.4 mg/dL — ABNORMAL LOW (ref 8.9–10.3)
Chloride: 105 mmol/L (ref 98–111)
Creatinine, Ser: 1.31 mg/dL — ABNORMAL HIGH (ref 0.61–1.24)
GFR, Estimated: >60 mL/min (ref >60)
Glucose, Bld: 127 mg/dL — ABNORMAL HIGH (ref 70–99)
Potassium: 3.9 mmol/L (ref 3.5–5.1)
Sodium: 137 mmol/L (ref 135–145)
Total Bilirubin: 0.6 mg/dL (ref 0.3–1.2)
Total Protein: 6.3 g/dL — ABNORMAL LOW (ref 6.5–8.1)

## 2020-03-16 LAB — CBC WITH DIFFERENTIAL/PLATELET
Abs Immature Granulocytes: 0.47 10*3/uL — ABNORMAL HIGH (ref 0.00–0.07)
Basophils Absolute: 0.1 10*3/uL (ref 0.0–0.1)
Basophils Relative: 0 %
Eosinophils Absolute: 0 10*3/uL (ref 0.0–0.5)
Eosinophils Relative: 0 %
HCT: 42.3 % (ref 39.0–52.0)
Hemoglobin: 13.6 g/dL (ref 13.0–17.0)
Immature Granulocytes: 4 %
Lymphocytes Relative: 17 %
Lymphs Abs: 2 10*3/uL (ref 0.7–4.0)
MCH: 29.1 pg (ref 26.0–34.0)
MCHC: 32.2 g/dL (ref 30.0–36.0)
MCV: 90.6 fL (ref 80.0–100.0)
Monocytes Absolute: 0.9 10*3/uL (ref 0.1–1.0)
Monocytes Relative: 8 %
Neutro Abs: 8.7 10*3/uL — ABNORMAL HIGH (ref 1.7–7.7)
Neutrophils Relative %: 71 %
Platelets: 289 10*3/uL (ref 150–400)
RBC: 4.67 MIL/uL (ref 4.22–5.81)
RDW: 13.1 % (ref 11.5–15.5)
WBC: 12.2 10*3/uL — ABNORMAL HIGH (ref 4.0–10.5)
nRBC: 0 % (ref 0.0–0.2)

## 2020-03-16 LAB — MAGNESIUM: Magnesium: 2.2 mg/dL (ref 1.7–2.4)

## 2020-03-16 LAB — FERRITIN: Ferritin: 647 ng/mL — ABNORMAL HIGH (ref 24–336)

## 2020-03-16 LAB — D-DIMER, QUANTITATIVE: D-Dimer, Quant: 2 ug/mL-FEU — ABNORMAL HIGH (ref 0.00–0.50)

## 2020-03-16 LAB — C-REACTIVE PROTEIN: CRP: 6.7 mg/dL — ABNORMAL HIGH (ref <1.0)

## 2020-03-16 MED ORDER — METOPROLOL TARTRATE 50 MG PO TABS
50.0000 mg | ORAL_TABLET | Freq: Two times a day (BID) | ORAL | Status: DC
  Administered 2020-03-16 – 2020-03-17 (×2): 50 mg via ORAL
  Filled 2020-03-16 (×2): qty 1

## 2020-03-16 MED ORDER — ALBUTEROL SULFATE HFA 108 (90 BASE) MCG/ACT IN AERS
2.0000 | INHALATION_SPRAY | Freq: Three times a day (TID) | RESPIRATORY_TRACT | Status: DC
  Administered 2020-03-17: 2 via RESPIRATORY_TRACT

## 2020-03-16 NOTE — Progress Notes (Signed)
PROGRESS NOTE   Tony Meyer  IOX:[id number] DOB: 10-17-1970 DOA: 03/14/2020 PCP: Patient, No Pcp Per   Chief Complaint  Patient presents with  . Shortness of Breath    Brief Admission History:  49 y.o. male with  No significant medical history.  Presented to the ED with complaints of difficulty breathing, productive cough of 1 week duration.  Patient has not been vaccinated for Covid.  He was seen at an urgent care about 3 days ago and was prescribed prednisone albuterol and antibiotics, which he reports taking for 2 days, but with persistence and worsening of symptoms he presented to the ED. He denies chest pain, no lower extremity swelling.   ED Course: O2 sats 87% on room air, placed on 2 L O2 via nasal cannula.  Chest x-ray showing bilateral patchy opacities edema or pneumonia.  Creatinine mildly elevated 1.4.  D-dimer elevated 2.86.Marland Kitchen  Blood cultures obtained.  CTA ordered in the ED.  Hospitalist admit for Covid pneumonia.   Assessment & Plan:   Active Problems:   Pneumonia due to COVID-19 virus   Hypoxia   Essential hypertension   AKI (acute kidney injury) (HCC)   1. Covid Pneumonia - Pt has decreasing oxygen requirement.  Continue baricitinib and continue remdesivir and steroids as ordered and supplemental supportive measures.  Continue to follow inflammatory markers closely. 2. AKI-presuming this is a prerenal insult and treating with gentle IV fluid hydration follow renal function closely and recheck in a.m.  Renally dose medications. 3. Essential hypertension-blood pressure is elevated and will add amlodipine 5 mg daily.  Added metoprolol 50 mg BID.   DVT prophylaxis: enoxaparin  Code Status: full  Family Communication: patient  Disposition: home   Status is: Inpatient  Remains inpatient appropriate because:IV treatments appropriate due to intensity of illness or inability to take PO and Inpatient level of care appropriate due to severity of illness   Dispo: The  patient is from: Home              Anticipated d/c is to: Home              Anticipated d/c date is: 1 day              Patient currently is not medically stable to d/c.   Consultants:     Procedures:     Antimicrobials:  Remdesivir 11/30>>   Subjective: Pt reports SOB but less SOB than last 2 days.    Objective: Vitals:   03/16/20 0604 03/16/20 0821 03/16/20 1433 03/16/20 1800  BP: (!) 156/126   (!) 166/107  Pulse: 85   91  Resp: 20   20  Temp: (!) 97.5 F (36.4 C)     TempSrc:      SpO2: 91% 95% 97% 98%  Weight:      Height:        Intake/Output Summary (Last 24 hours) at 03/16/2020 1855 Last data filed at 03/16/2020 1747 Gross per 24 hour  Intake 3500.6 ml  Output --  Net 3500.6 ml   Filed Weights   03/14/20 1113 03/14/20 1854  Weight: 103.7 kg 100.7 kg    Examination:  General exam: Appears calm and comfortable  Respiratory system: Clear to auscultation. Respiratory effort normal. Cardiovascular system: S1 & S2 heard, RRR. No JVD, murmurs, rubs, gallops or clicks. No pedal edema. Gastrointestinal system: Abdomen is nondistended, soft and nontender. No organomegaly or masses felt. Normal bowel sounds heard. Central nervous system: Alert and oriented. No  focal neurological deficits. Extremities: Symmetric 5 x 5 power. Skin: No rashes, lesions or ulcers Psychiatry: Judgement and insight appear normal. Mood & affect appropriate.   Data Reviewed: I have personally reviewed following labs and imaging studies  CBC: Recent Labs  Lab 03/14/20 1242 03/16/20 0411  WBC 10.9* 12.2*  NEUTROABS 8.4* 8.7*  HGB 14.9 13.6  HCT 46.0 42.3  MCV 89.1 90.6  PLT 259 289    Basic Metabolic Panel: Recent Labs  Lab 03/14/20 1242 03/16/20 0411  NA 134* 137  K 3.9 3.9  CL 98 105  CO2 25 22  GLUCOSE 114* 127*  BUN 24* 26*  CREATININE 1.41* 1.31*  CALCIUM 8.6* 8.4*  MG  --  2.2  PHOS  --  3.6    GFR: Estimated Creatinine Clearance: 84.5 mL/min (A) (by C-G  formula based on SCr of 1.31 mg/dL (H)).  Liver Function Tests: Recent Labs  Lab 03/14/20 1242 03/16/20 0411  AST 42* 28  ALT 58* 47*  ALKPHOS 39 37*  BILITOT 0.7 0.6  PROT 7.4 6.3*  ALBUMIN 3.3* 2.7*    CBG: No results for input(s): GLUCAP in the last 168 hours.  Recent Results (from the past 240 hour(s))  Resp Panel by RT-PCR (Flu A&B, Covid) Nasopharyngeal Swab     Status: Abnormal   Collection Time: 03/14/20 12:56 PM   Specimen: Nasopharyngeal Swab; Nasopharyngeal(NP) swabs in vial transport medium  Result Value Ref Range Status   SARS Coronavirus 2 by RT PCR POSITIVE (A) NEGATIVE Final    Comment: RESULT CALLED TO, READ BACK BY AND VERIFIED WITH: HOLCOMB,R. AT 1434 ON 03/14/2020 BY BAUGHAM,M. (NOTE) SARS-CoV-2 target nucleic acids are DETECTED.  The SARS-CoV-2 RNA is generally detectable in upper respiratory specimens during the acute phase of infection. Positive results are indicative of the presence of the identified virus, but do not rule out bacterial infection or co-infection with other pathogens not detected by the test. Clinical correlation with patient history and other diagnostic information is necessary to determine patient infection status. The expected result is Negative.  Fact Sheet for Patients: BloggerCourse.com  Fact Sheet for Healthcare Providers: SeriousBroker.it  This test is not yet approved or cleared by the [place] FDA and  has been authorized for detection and/or diagnosis of SARS-CoV-2 by FDA under an Emergency Use Authorization (EUA).  This EUA will remain in effect (meaning thi s test can be used) for the duration of  the COVID-19 declaration under Section 564(b)(1) of the Act, 21 U.S.C. section 360bbb-3(b)(1), unless the authorization is terminated or revoked sooner.     Influenza A by PCR NEGATIVE NEGATIVE Final   Influenza B by PCR NEGATIVE NEGATIVE Final    Comment:  (NOTE) The Xpert Xpress SARS-CoV-2/FLU/RSV plus assay is intended as an aid in the diagnosis of influenza from Nasopharyngeal swab specimens and should not be used as a sole basis for treatment. Nasal washings and aspirates are unacceptable for Xpert Xpress SARS-CoV-2/FLU/RSV testing.  Fact Sheet for Patients: BloggerCourse.com  Fact Sheet for Healthcare Providers: SeriousBroker.it  This test is not yet approved or cleared by the [place] FDA and has been authorized for detection and/or diagnosis of SARS-CoV-2 by FDA under an Emergency Use Authorization (EUA). This EUA will remain in effect (meaning this test can be used) for the duration of the COVID-19 declaration under Section 564(b)(1) of the Act, 21 U.S.C. section 360bbb-3(b)(1), unless the authorization is terminated or revoked.  Performed at [facility], [address] [place], [place]  [address]   Blood Culture (routine x 2)     Status: None (Preliminary result)   Collection Time: 03/14/20  3:17 PM   Specimen: BLOOD  Result Value Ref Range Status   Specimen Description BLOOD RIGHT ANTECUBITAL  Final   Special Requests   Final    BOTTLES DRAWN AEROBIC AND ANAEROBIC Blood Culture adequate volume   Culture   Final    NO GROWTH 2 DAYS Performed at [facility], [address] [place], [place] [address]    Report Status PENDING  Incomplete  Blood Culture (routine x 2)     Status: None (Preliminary result)   Collection Time: 03/14/20  3:17 PM   Specimen: BLOOD  Result Value Ref Range Status   Specimen Description BLOOD LEFT ANTECUBITAL  Final   Special Requests   Final    BOTTLES DRAWN AEROBIC AND ANAEROBIC Blood Culture results may not be optimal due to an inadequate volume of blood received in culture bottles   Culture   Final    NO GROWTH 2 DAYS Performed at [facility], [address], [place], [place] [address]    Report Status PENDING  Incomplete      Radiology Studies: No results found. Scheduled Meds: . albuterol  2 puff Inhalation Q6H  . amLODipine  5 mg Oral Daily  . vitamin C  500 mg Oral Daily  . baricitinib  4 mg Oral Daily  . dexamethasone (DECADRON) injection  6 mg Intravenous Q24H  . enoxaparin (LOVENOX) injection  40 mg Subcutaneous Q24H  . metoprolol tartrate  25 mg Oral BID  . zinc sulfate  220 mg Oral Daily   Continuous Infusions: . sodium chloride 70 mL/hr at 03/16/20 1556  . remdesivir 100 mg in NS 100 mL Stopped (03/16/20 0924)     LOS: 2 days   Time spent: 35 mins    Hy Swiatek Laural Benes, MD How to contact the [facility] Attending or Consulting provider 7A - 7P or covering provider during after hours 7P -7A, for this patient?  1. Check the care team in [facility] and look for a) attending/consulting [facility] provider listed and b) the [facility] team listed 2. Log into [email or web address] and use [facility]'s universal password to access. If you do not have the password, please contact the hospital operator. 3. Locate the [facility] provider you are looking for under [facility] and page to a number that you can be directly reached. 4. If you still have difficulty reaching the provider, please page the [facility] (Director on Call) for the Hospitalists listed on amion for assistance.  03/16/2020, 6:55 PM

## 2020-03-17 LAB — CBC WITH DIFFERENTIAL/PLATELET
Abs Immature Granulocytes: 0.7 10*3/uL — ABNORMAL HIGH (ref 0.00–0.07)
Basophils Absolute: 0.1 10*3/uL (ref 0.0–0.1)
Basophils Relative: 1 %
Eosinophils Absolute: 0.1 10*3/uL (ref 0.0–0.5)
Eosinophils Relative: 1 %
HCT: 45.5 % (ref 39.0–52.0)
Hemoglobin: 14.6 g/dL (ref 13.0–17.0)
Immature Granulocytes: 5 %
Lymphocytes Relative: 16 %
Lymphs Abs: 2.4 10*3/uL (ref 0.7–4.0)
MCH: 29.1 pg (ref 26.0–34.0)
MCHC: 32.1 g/dL (ref 30.0–36.0)
MCV: 90.6 fL (ref 80.0–100.0)
Monocytes Absolute: 1.2 10*3/uL — ABNORMAL HIGH (ref 0.1–1.0)
Monocytes Relative: 8 %
Neutro Abs: 10.2 10*3/uL — ABNORMAL HIGH (ref 1.7–7.7)
Neutrophils Relative %: 69 %
Platelets: 334 10*3/uL (ref 150–400)
RBC: 5.02 MIL/uL (ref 4.22–5.81)
RDW: 13 % (ref 11.5–15.5)
WBC: 14.6 10*3/uL — ABNORMAL HIGH (ref 4.0–10.5)
nRBC: 0 % (ref 0.0–0.2)

## 2020-03-17 LAB — FERRITIN: Ferritin: 600 ng/mL — ABNORMAL HIGH (ref 24–336)

## 2020-03-17 LAB — COMPREHENSIVE METABOLIC PANEL
ALT: 44 U/L (ref 0–44)
AST: 23 U/L (ref 15–41)
Albumin: 3 g/dL — ABNORMAL LOW (ref 3.5–5.0)
Alkaline Phosphatase: 42 U/L (ref 38–126)
Anion gap: 11 (ref 5–15)
BUN: 28 mg/dL — ABNORMAL HIGH (ref 6–20)
CO2: 22 mmol/L (ref 22–32)
Calcium: 8.6 mg/dL — ABNORMAL LOW (ref 8.9–10.3)
Chloride: 105 mmol/L (ref 98–111)
Creatinine, Ser: 1.26 mg/dL — ABNORMAL HIGH (ref 0.61–1.24)
GFR, Estimated: >60 mL/min (ref >60)
Glucose, Bld: 113 mg/dL — ABNORMAL HIGH (ref 70–99)
Potassium: 4.3 mmol/L (ref 3.5–5.1)
Sodium: 138 mmol/L (ref 135–145)
Total Bilirubin: 0.5 mg/dL (ref 0.3–1.2)
Total Protein: 6.7 g/dL (ref 6.5–8.1)

## 2020-03-17 LAB — MAGNESIUM: Magnesium: 2.2 mg/dL (ref 1.7–2.4)

## 2020-03-17 LAB — D-DIMER, QUANTITATIVE: D-Dimer, Quant: 1.9 ug/mL-FEU — ABNORMAL HIGH (ref 0.00–0.50)

## 2020-03-17 LAB — PHOSPHORUS: Phosphorus: 3.7 mg/dL (ref 2.5–4.6)

## 2020-03-17 LAB — C-REACTIVE PROTEIN: CRP: 5.5 mg/dL — ABNORMAL HIGH (ref <1.0)

## 2020-03-17 MED ORDER — METOPROLOL TARTRATE 50 MG PO TABS
50.0000 mg | ORAL_TABLET | Freq: Two times a day (BID) | ORAL | 1 refills | Status: DC
Start: 2020-03-17 — End: 2020-07-21

## 2020-03-17 MED ORDER — AMLODIPINE BESYLATE 5 MG PO TABS: 5.0000 mg | ORAL_TABLET | Freq: Every day | ORAL | 0 refills | Status: DC

## 2020-03-17 MED ORDER — DEXAMETHASONE 6 MG PO TABS: 6.0000 mg | ORAL_TABLET | Freq: Every day | ORAL | 0 refills | Status: AC

## 2020-03-17 MED ORDER — GUAIFENESIN-DM 100-10 MG/5ML PO SYRP: 10.0000 mL | ORAL_SOLUTION | ORAL | 0 refills | Status: DC | PRN

## 2020-03-17 MED ORDER — ALBUTEROL SULFATE HFA 108 (90 BASE) MCG/ACT IN AERS: 2.0000 | INHALATION_SPRAY | RESPIRATORY_TRACT | 2 refills | Status: DC | PRN

## 2020-03-17 MED ORDER — ASCORBIC ACID 500 MG PO TABS
500.0000 mg | ORAL_TABLET | Freq: Every day | ORAL | 0 refills | Status: DC
Start: 2020-03-18 — End: 2020-07-21

## 2020-03-17 MED ORDER — ZINC SULFATE 220 (50 ZN) MG PO CAPS: 220.0000 mg | ORAL_CAPSULE | Freq: Every day | ORAL | 0 refills | Status: AC

## 2020-03-17 NOTE — Plan of Care (Signed)
  Problem: Education: Goal: Knowledge of risk factors and measures for prevention of condition will improve Outcome: Progressing   Problem: Coping: Goal: Psychosocial and spiritual needs will be supported Outcome: Progressing   Problem: Respiratory: Goal: Will maintain a patent airway Outcome: Progressing Goal: Complications related to the disease process, condition or treatment will be avoided or minimized Outcome: Progressing   Problem: Education: Goal: Knowledge of General Education information will improve Description: Including pain rating scale, medication(s)/side effects and non-pharmacologic comfort measures Outcome: Progressing   Problem: Clinical Measurements: Goal: Ability to maintain clinical measurements within normal limits will improve Outcome: Progressing Goal: Respiratory complications will improve Outcome: Progressing Goal: Cardiovascular complication will be avoided Outcome: Progressing   Problem: Activity: Goal: Risk for activity intolerance will decrease Outcome: Progressing   Problem: Coping: Goal: Level of anxiety will decrease Outcome: Progressing   Problem: Elimination: Goal: Will not experience complications related to bowel motility Outcome: Progressing Goal: Will not experience complications related to urinary retention Outcome: Progressing   Problem: Pain Managment: Goal: General experience of comfort will improve Outcome: Progressing   Problem: Safety: Goal: Ability to remain free from injury will improve Outcome: Progressing   Problem: Skin Integrity: Goal: Risk for impaired skin integrity will decrease Outcome: Progressing

## 2020-03-17 NOTE — Discharge Instructions (Signed)
You are scheduled for an outpatient Remdesivir infusion at 12pm on Saturday 12/4 at [facility]. Please park at [address] [place], [place], as staff will be escorting you through the east entrance of the hospital. Appointments take approximately 45 minutes.    The address for the infusion clinic site is:  --GPS address is [address] - the parking is located near [employer] building where you will see  COVID19 Infusion feather banner marking the entrance to parking.   (see photos below)            --Enter into the 2nd entrance where the "wave, flag banner" is at the road. Turn into this 2nd entrance and immediately turn left to park in 1 of the 5 parking spots.   --Please stay in your car and call the desk for assistance inside [phone].   The day of your visit you should: Marland Kitchen Get plenty of rest the night before and drink plenty of water . Eat a light meal/snack before coming and take your medications as prescribed  . Wear warm, comfortable clothes with a shirt that can roll-up over the elbow (will need IV start).  . Wear a mask  . Consider bringing some activity to help pass the time        COVID-19 Frequently Asked Questions COVID-19 (coronavirus disease) is an infection that is caused by a large family of viruses. Some viruses cause illness in people and others cause illness in animals like camels, cats, and bats. In some cases, the viruses that cause illness in animals can spread to humans. Where did the coronavirus come from? In December 2019, [place] told the [employer] [facility]) of several cases of lung disease (human respiratory illness). These cases were linked to an open seafood and livestock market in the city of [place]. The link to the seafood and livestock market suggests that the virus may have spread from animals to humans. However, since that first outbreak in December, the virus has also been shown to spread from person to person. What is  the name of the disease and the virus? Disease name Early on, this disease was called novel coronavirus. This is because scientists determined that the disease was caused by a new (novel) respiratory virus. The World Health Organization [facility]) has now named the disease COVID-19, or coronavirus disease. Virus name The virus that causes the disease is called severe acute respiratory syndrome coronavirus 2 (SARS-CoV-2). More information on disease and virus naming World Health Organization [facility]): www.who.int/emergencies/diseases/novel-coronavirus-2019/technical-guidance/naming-the-coronavirus-disease-(covid-2019)-and-the-virus-that-causes-it Who is at risk for complications from coronavirus disease? Some people may be at higher risk for complications from coronavirus disease. This includes older adults and people who have chronic diseases, such as heart disease, diabetes, and lung disease. If you are at higher risk for complications, take these extra precautions:  Stay home as much as possible.  Avoid social gatherings and travel.  Avoid close contact with others. Stay at least 6 ft (2 m) away from others, if possible.  Wash your hands often with soap and water for at least 20 seconds.  Avoid touching your face, mouth, nose, or eyes.  Keep supplies on hand at home, such as food, medicine, and cleaning supplies.  If you must go out in public, wear a cloth face covering or face mask. Make sure your mask covers your nose and mouth. How does coronavirus disease spread? The virus that causes coronavirus disease spreads easily from person to person (is contagious). You may  catch the virus by:  Breathing in droplets from an infected person. Droplets can be spread by a person breathing, speaking, singing, coughing, or sneezing.  Touching something, like a table or a doorknob, that was exposed to the virus (contaminated) and then touching your mouth, nose, or eyes. Can I get the virus from touching  surfaces or objects? There is still a lot that we do not know about the virus that causes coronavirus disease. Scientists are basing a lot of information on what they know about similar viruses, such as:  Viruses cannot generally survive on surfaces for long. They need a human body (host) to survive.  It is more likely that the virus is spread by close contact with people who are sick (direct contact), such as through: ? Shaking hands or hugging. ? Breathing in respiratory droplets that travel through the air. Droplets can be spread by a person breathing, speaking, singing, coughing, or sneezing.  It is less likely that the virus is spread when a person touches a surface or object that has the virus on it (indirect contact). The virus may be able to enter the body if the person touches a surface or object and then touches his or her face, eyes, nose, or mouth. Can a person spread the virus without having symptoms of the disease? It may be possible for the virus to spread before a person has symptoms of the disease, but this is most likely not the main way the virus is spreading. It is more likely for the virus to spread by being in close contact with people who are sick and breathing in the respiratory droplets spread by a person breathing, speaking, singing, coughing, or sneezing. What are the symptoms of coronavirus disease? Symptoms vary from person to person and can range from mild to severe. Symptoms may include:  Fever or chills.  Cough.  Difficulty breathing or feeling short of breath.  Headaches, body aches, or muscle aches.  Runny or stuffy (congested) nose.  Sore throat.  New loss of taste or smell.  Nausea, vomiting, or diarrhea. These symptoms can appear anywhere from 2 to 14 days after you have been exposed to the virus. Some people may not have any symptoms. If you develop symptoms, call your health care provider. People with severe symptoms may need hospital care. Should  I be tested for this virus? Your health care provider will decide whether to test you based on your symptoms, history of exposure, and your risk factors. How does a health care provider test for this virus? Health care providers will collect samples to send for testing. Samples may include:  Taking a swab of fluid from the back of your nose and throat, your nose, or your throat.  Taking fluid from the lungs by having you cough up mucus (sputum) into a sterile cup.  Taking a blood sample. Is there a treatment or vaccine for this virus? Currently, there is no vaccine to prevent coronavirus disease. Also, there are no medicines like antibiotics or antivirals to treat the virus. A person who becomes sick is given supportive care, which means rest and fluids. A person may also relieve his or her symptoms by using over-the-counter medicines that treat sneezing, coughing, and runny nose. These are the same medicines that a person takes for the common cold. If you develop symptoms, call your health care provider. People with severe symptoms may need hospital care. What can I do to protect myself and my family from this virus?  You can protect yourself and your family by taking the same actions that you would take to prevent the spread of other viruses. Take the following actions:  Wash your hands often with soap and water for at least 20 seconds. If soap and water are not available, use alcohol-based hand sanitizer.  Avoid touching your face, mouth, nose, or eyes.  Cough or sneeze into a tissue, sleeve, or elbow. Do not cough or sneeze into your hand or the air. ? If you cough or sneeze into a tissue, throw it away immediately and wash your hands.  Disinfect objects and surfaces that you frequently touch every day.  Stay away from people who are sick.  Avoid going out in public, follow guidance from your state and local health authorities.  Avoid crowded indoor spaces. Stay at least 6 ft  (2 m) away from others.  If you must go out in public, wear a cloth face covering or face mask. Make sure your mask covers your nose and mouth.  Stay home if you are sick, except to get medical care. Call your health care provider before you get medical care. Your health care provider will tell you how long to stay home.  Make sure your vaccines are up to date. Ask your health care provider what vaccines you need. What should I do if I need to travel? Follow travel recommendations from your local health authority, the CDC, and WHO. Travel information and advice  Centers for Disease Control and Prevention (CDC): GeminiCard.gl  World Health Organization [facility]): PreviewDomains.se Know the risks and take action to protect your health  You are at higher risk of getting coronavirus disease if you are traveling to areas with an outbreak or if you are exposed to travelers from areas with an outbreak.  Wash your hands often and practice good hygiene to lower the risk of catching or spreading the virus. What should I do if I am sick? General instructions to stop the spread of infection  Wash your hands often with soap and water for at least 20 seconds. If soap and water are not available, use alcohol-based hand sanitizer.  Cough or sneeze into a tissue, sleeve, or elbow. Do not cough or sneeze into your hand or the air.  If you cough or sneeze into a tissue, throw it away immediately and wash your hands.  Stay home unless you must get medical care. Call your health care provider or local health authority before you get medical care.  Avoid public areas. Do not take public transportation, if possible.  If you can, wear a mask if you must go out of the house or if you are in close contact with someone who is not sick. Make sure your mask covers your nose and mouth. Keep your home clean  Disinfect  objects and surfaces that are frequently touched every day. This may include: ? Counters and tables. ? Doorknobs and light switches. ? Sinks and faucets. ? Electronics such as phones, remote controls, keyboards, computers, and tablets.  Wash dishes in hot, soapy water or use a dishwasher. Air-dry your dishes.  Wash laundry in hot water. Prevent infecting other household members  Let healthy household members care for children and pets, if possible. If you have to care for children or pets, wash your hands often and wear a mask.  Sleep in a different bedroom or bed, if possible.  Do not share personal items, such as razors, toothbrushes, deodorant, combs, brushes, towels, and washcloths. Where  to find more information Centers for Disease Control and Prevention (CDC)  Information and news updates: CardRetirement.cz World Health Organization [facility])  Information and news updates: AffordableSalon.es  Coronavirus health topic: https://thompson-craig.com/  Questions and answers on COVID-19: kruiseway.com  Global tracker: who.sprinklr.com American Academy of Pediatrics (AAP)  Information for families: www.healthychildren.org/English/health-issues/conditions/chest-lungs/Pages/2019-Novel-Coronavirus.aspx The coronavirus situation is changing rapidly. Check your local health authority website or the CDC and [facility] websites for updates and news. When should I contact a health care provider?  Contact your health care provider if you have symptoms of an infection, such as fever or cough, and you: ? Have been near anyone who is known to have coronavirus disease. ? Have come into contact with a person who is suspected to have coronavirus disease. ? Have traveled to an area where there is an outbreak of COVID-19. When should I get emergency medical care?  Get help right away by calling your local  emergency services (911 in the [place] if you have: ? Trouble breathing. ? Pain or pressure in your chest. ? Confusion. ? Blue-tinged lips and fingernails. ? Difficulty waking from sleep. ? Symptoms that get worse. Let the emergency medical personnel know if you think you have coronavirus disease. Summary  A new respiratory virus is spreading from person to person and causing COVID-19 (coronavirus disease).  The virus that causes COVID-19 appears to spread easily. It spreads from one person to another through droplets from breathing, speaking, singing, coughing, or sneezing.  Older adults and those with chronic diseases are at higher risk of disease. If you are at higher risk for complications, take extra precautions.  There is currently no vaccine to prevent coronavirus disease. There are no medicines, such as antibiotics or antivirals, to treat the virus.  You can protect yourself and your family by washing your hands often, avoiding touching your face, and covering your coughs and sneezes. This information is not intended to replace advice given to you by your health care provider. Make sure you discuss any questions you have with your health care provider. Document Revised: 01/29/2019 Document Reviewed: 07/28/2018 Elsevier Patient Education  2020 Elsevier Inc.  10 Things You Can Do to Manage Your COVID-19 Symptoms at Home If you have possible or confirmed COVID-19: 1. Stay home from work and school. And stay away from other public places. If you must go out, avoid using any kind of public transportation, ridesharing, or taxis. 2. Monitor your symptoms carefully. If your symptoms get worse, call your healthcare provider immediately. 3. Get rest and stay hydrated. 4. If you have a medical appointment, call the healthcare provider ahead of time and tell them that you have or may have COVID-19. 5. For medical emergencies, call 911 and notify the dispatch personnel that you have or may  have COVID-19. 6. Cover your cough and sneezes with a tissue or use the inside of your elbow. 7. Wash your hands often with soap and water for at least 20 seconds or clean your hands with an alcohol-based hand sanitizer that contains at least 60% alcohol. 8. As much as possible, stay in a specific room and away from other people in your home. Also, you should use a separate bathroom, if available. If you need to be around other people in or outside of the home, wear a mask. 9. Avoid sharing personal items with other people in your household, like dishes, towels, and bedding. 10. Clean all surfaces that are touched often, like counters, tabletops, and doorknobs. Use household cleaning sprays  or wipes according to the label instructions. SouthAmericaFlowers.co.uk 10/14/2018 This information is not intended to replace advice given to you by your health care provider. Make sure you discuss any questions you have with your health care provider. Document Revised: 03/18/2019 Document Reviewed: 03/18/2019 Elsevier Patient Education  2020 Elsevier Inc.  COVID-19: Quarantine vs. Isolation QUARANTINE keeps someone who was in close contact with someone who has COVID-19 away from others. If you had close contact with a person who has COVID-19  Stay home until 14 days after your last contact.  Check your temperature twice a day and watch for symptoms of COVID-19.  If possible, stay away from people who are at higher-risk for getting very sick from COVID-19. ISOLATION keeps someone who is sick or tested positive for COVID-19 without symptoms away from others, even in their own home. If you are sick and think or know you have COVID-19  Stay home until after ? At least 10 days since symptoms first appeared and ? At least 24 hours with no fever without fever-reducing medication and ? Symptoms have improved If you tested positive for COVID-19 but do not have symptoms  Stay home until after ? 10 days have  passed since your positive test If you live with others, stay in a specific "sick room" or area and away from other people or animals, including pets. Use a separate bathroom, if available. SouthAmericaFlowers.co.uk 11/02/2018 This information is not intended to replace advice given to you by your health care provider. Make sure you discuss any questions you have with your health care provider. Document Revised: 03/18/2019 Document Reviewed: 03/18/2019 Elsevier Patient Education  2020 Elsevier Inc.  Prevent the Spread of COVID-19 if You Are Sick If you are sick with COVID-19 or think you might have COVID-19, follow the steps below to care for yourself and to help protect other people in your home and community. Stay home except to get medical care.  Stay home. Most people with COVID-19 have mild illness and are able to recover at home without medical care. Do not leave your home, except to get medical care. Do not visit public areas.  Take care of yourself. Get rest and stay hydrated. Take over-the-counter medicines, such as acetaminophen, to help you feel better.  Stay in touch with your doctor. Call before you get medical care. Be sure to get care if you have trouble breathing, or have any other emergency warning signs, or if you think it is an emergency.  Avoid public transportation, ride-sharing, or taxis. Separate yourself from other people and pets in your home.  As much as possible, stay in a specific room and away from other people and pets in your home. Also, you should use a separate bathroom, if available. If you need to be around other people or animals in or outside of the home, wear a mask. ? See COVID-19 and Animals if you have questions about AnniversaryBlowout.com.ee. ? Additional guidance is available for those living in close quarters. (http://white.info/.html) and shared  housing (DisasterTalk.co.uk). Monitor your symptoms.  Symptoms of COVID-19 include fever, cough, and shortness of breath but other symptoms may be present as well.  Follow care instructions from your healthcare provider and local health department. Your local health authorities will give instructions on checking your symptoms and reporting information. When to Seek Emergency Medical Attention Look for emergency warning signs* for COVID-19. If someone is showing any of these signs, seek emergency medical care immediately:  Trouble breathing  Persistent pain or  pressure in the chest  New confusion  Bluish lips or face  Inability to wake or stay awake *This list is not all possible symptoms. Please call your medical provider for any other symptoms that are severe or concerning to you. Call 911 or call ahead to your local emergency facility: Notify the operator that you are seeking care for someone who has or may have COVID-19. Call ahead before visiting your doctor.  Call ahead. Many medical visits for routine care are being postponed or done by phone or telemedicine.  If you have a medical appointment that cannot be postponed, call your doctor's office, and tell them you have or may have COVID-19. If you are sick, wear a mask over your nose and mouth.  You should wear a mask over your nose and mouth if you must be around other people or animals, including pets (even at home).  You don't need to wear the mask if you are alone. If you can't put on a mask (because of trouble breathing for example), cover your coughs and sneezes in some other way. Try to stay at least 6 feet away from other people. This will help protect the people around you.  Masks should not be placed on young children under age 64 years, anyone who has trouble breathing, or anyone who is not able to remove the mask without help. Note: During the COVID-19  pandemic, medical grade facemasks are reserved for healthcare workers and some first responders. You may need to make a mask using a scarf or bandana. Cover your coughs and sneezes.  Cover your mouth and nose with a tissue when you cough or sneeze.  Throw used tissues in a lined trash can.  Immediately wash your hands with soap and water for at least 20 seconds. If soap and water are not available, clean your hands with an alcohol-based hand sanitizer that contains at least 60% alcohol. Clean your hands often.  Wash your hands often with soap and water for at least 20 seconds. This is especially important after blowing your nose, coughing, or sneezing; going to the bathroom; and before eating or preparing food.  Use hand sanitizer if soap and water are not available. Use an alcohol-based hand sanitizer with at least 60% alcohol, covering all surfaces of your hands and rubbing them together until they feel dry.  Soap and water are the best option, especially if your hands are visibly dirty.  Avoid touching your eyes, nose, and mouth with unwashed hands. Avoid sharing personal household items.  Do not share dishes, drinking glasses, cups, eating utensils, towels, or bedding with other people in your home.  Wash these items thoroughly after using them with soap and water or put them in the dishwasher. Clean all "high-touch" surfaces everyday.  Clean and disinfect high-touch surfaces in your "sick room" and bathroom. Let someone else clean and disinfect surfaces in common areas, but not your bedroom and bathroom.  If a caregiver or other person needs to clean and disinfect a sick person's bedroom or bathroom, they should do so on an as-needed basis. The caregiver/other person should wear a mask and wait as long as possible after the sick person has used the bathroom. High-touch surfaces include phones, remote controls, counters, tabletops, doorknobs, bathroom fixtures, toilets, keyboards,  tablets, and bedside tables.  Clean and disinfect areas that may have blood, stool, or body fluids on them.  Use household cleaners and disinfectants. Clean the area or item with soap and water or  another detergent if it is dirty. Then use a household disinfectant. ? Be sure to follow the instructions on the label to ensure safe and effective use of the product. Many products recommend keeping the surface wet for several minutes to ensure germs are killed. Many also recommend precautions such as wearing gloves and making sure you have good ventilation during use of the product. ? Most EPA-registered household disinfectants should be effective. When you can be around others after you had or likely had COVID-19 When you can be around others (end home isolation) depends on different factors for different situations.  I think or know I had COVID-19, and I had symptoms ? You can be with others after  24 hours with no fever AND  Symptoms improved AND  10 days since symptoms first appeared ? Depending on your healthcare provider's advice and availability of testing, you might get tested to see if you still have COVID-19. If you will be tested, you can be around others when you have no fever, symptoms have improved, and you receive two negative test results in a row, at least 24 hours apart.  I tested positive for COVID-19 but had no symptoms ? If you continue to have no symptoms, you can be with others after:  10 days have passed since test ? Depending on your healthcare provider's advice and availability of testing, you might get tested to see if you still have COVID-19. If you will be tested, you can be around others after you receive two negative test results in a row, at least 24 hours apart. ? If you develop symptoms after testing positive, follow the guidance above for "I think or know I had COVID, and I had symptoms." [email or web address].[email or web address] 11/24/2018 This information is not intended to  replace advice given to you by your health care provider. Make sure you discuss any questions you have with your health care provider. Document Revised: 12/10/2018 Document Reviewed: 10/13/2018 Elsevier Patient Education  2020 Elsevier Inc.   IMPORTANT INFORMATION: PAY CLOSE ATTENTION   PHYSICIAN DISCHARGE INSTRUCTIONS  Follow with Primary care provider  Patient, No Pcp Per  and other consultants as instructed by your Hospitalist Physician  SEEK MEDICAL CARE OR RETURN TO EMERGENCY ROOM IF SYMPTOMS COME BACK, WORSEN OR NEW PROBLEM DEVELOPS   Please note: You were cared for by a hospitalist during your hospital stay. Every effort will be made to forward records to your primary care provider.  You can request that your primary care provider send for your hospital records if they have not received them.  Once you are discharged, your primary care physician will handle any further medical issues. Please note that NO REFILLS for any discharge medications will be authorized once you are discharged, as it is imperative that you return to your primary care physician (or establish a relationship with a primary care physician if you do not have one) for your post hospital discharge needs so that they can reassess your need for medications and monitor your lab values.  Please get a complete blood count and chemistry panel checked by your Primary MD at your next visit, and again as instructed by your Primary MD.  Get Medicines reviewed and adjusted: Please take all your medications with you for your next visit with your Primary MD  Laboratory/radiological data: Please request your Primary MD to go over all hospital tests and procedure/radiological results at the follow up, please ask your primary care provider to get all Hospital records sent to  his/her office.  In some cases, they will be blood work, cultures and biopsy results pending at the time of your discharge. Please request that your primary care  provider follow up on these results.  If you are diabetic, please bring your blood sugar readings with you to your follow up appointment with primary care.    Please call and make your follow up appointments as soon as possible.    Also Note the following: If you experience worsening of your admission symptoms, develop shortness of breath, life threatening emergency, suicidal or homicidal thoughts you must seek medical attention immediately by calling 911 or calling your MD immediately  if symptoms less severe.  You must read complete instructions/literature along with all the possible adverse reactions/side effects for all the Medicines you take and that have been prescribed to you. Take any new Medicines after you have completely understood and accpet all the possible adverse reactions/side effects.   Do not drive when taking Pain medications or sleeping medications (Benzodiazepines)  Do not take more than prescribed Pain, Sleep and Anxiety Medications. It is not advisable to combine anxiety,sleep and pain medications without talking with your primary care practitioner  Special Instructions: If you have smoked or chewed Tobacco  in the last 2 yrs please stop smoking, stop any regular Alcohol  and or any Recreational drug use.  Wear Seat belts while driving.  Do not drive if taking any narcotic, mind altering or controlled substances or recreational drugs or alcohol.

## 2020-03-17 NOTE — Progress Notes (Signed)
Patient scheduled for outpatient Remdesivir infusions at 12pm on Saturday 12/4 at [facility]. Please inform the patient to park at [address] [place], [place], as staff will be escorting the patient through the east entrance of the hospital. Appointments take approximately 45 minutes.    There is a wave flag banner located near the entrance on [address]. Turn into this entrance and immediately turn left or right and park in 1 of the 10 designated Covid Infusion Parking spots. There is a phone number on the sign, please call and let the staff know what spot you are in and we will come out and get you. For questions call [phone].  Thanks.

## 2020-03-17 NOTE — Discharge Summary (Addendum)
Physician Discharge Summary  JARRIN STALEY DPO:[id number] DOB: Dec 03, 1970 DOA: 03/14/2020  Admit date: 03/14/2020 Discharge date: 03/17/2020  Admitted From:  Home  Disposition: Home   Recommendations for Outpatient Follow-up:  1. Follow up with PCP in 2 weeks 2. Please monitor blood pressure and adjust therapy as needed 3. Please check BMP in 2 weeks to follow up on renal function  Home Health: DME home oxygen (Pt did not need home oxygen)   Discharge Condition: STABLE   CODE STATUS: FULL    Brief Hospitalization Summary: Please see all hospital notes, images, labs for full details of the hospitalization. ADMISSION HPI: Tony Meyer is a 49 y.o. male with  No significant medical history.  Presented to the ED with complaints of difficulty breathing, productive cough of 1 week duration.  Patient has not been vaccinated for Covid.  He was seen at an urgent care about 3 days ago and was prescribed prednisone albuterol and antibiotics, which he reports taking for 2 days, but with persistence and worsening of symptoms he presented to the ED. He denies chest pain, no lower extremity swelling.   ED Course: O2 sats 87% on room air, placed on 2 L O2 via nasal cannula.  Chest x-ray showing bilateral patchy opacities edema or pneumonia.  Creatinine mildly elevated 1.4.  D-dimer elevated 2.86.Marland Kitchen  Blood cultures obtained.  CTA ordered in the ED.  Hospitalist admit for Covid pneumonia.    Hospital Course  1. Acute respiratory failure with hypoxia secondary to Covid Pneumonia - Pt has decreasing oxygen requirement. He is now on 2L/min [place] and was weaned to room air this morning.   He was treated wtih baricitinib and remdesivir and steroids and supplemental supportive measures.  We have followed inflammatory markers closely and they are trending down.  He feels well to go home.  Home O2 eval ordered with supplemental home oxygen as needed but after testing he did not need home oxygen.  He will need 1  additional remdesivir treatment to complete outpatient to complete full course of therapy.  This has been scheduled and patient agreeable to go and says that he has transportation.  2. AKI-presuming this is a prerenal insult and treatedwith gentle IV fluid hydration with improvement in renal function.  He may have underlying CKD given his undiagnosed hypertension.  I have asked him to follow up with PCP and have labs rechecked in 2 weeks.  Pt verbalized understanding.  I have consulted TOC to assist him with obtaining a PCP.  3. Essential hypertension-blood pressure slowly coming down, continue amlodipine 5 mg daily and metoprolol 50 mg BID.   DVT prophylaxis: enoxaparin  Code Status: full  Family Communication: patient  Disposition: home   Discharge Diagnoses:  Active Problems:   Pneumonia due to COVID-19 virus   Hypoxia   Essential hypertension   AKI (acute kidney injury) [facility])  Discharge Instructions:  Allergies as of 03/17/2020   Not on File     Medication List    TAKE these medications   albuterol 108 (90 Base) MCG/ACT inhaler Commonly known as: VENTOLIN HFA Inhale 2 puffs into the lungs every 4 (four) hours as needed for wheezing or shortness of breath.   amLODipine 5 MG tablet Commonly known as: NORVASC Take 1 tablet (5 mg total) by mouth daily. Start taking on: March 18, 2020   ascorbic acid 500 MG tablet Commonly known as: VITAMIN C Take 1 tablet (500 mg total) by mouth daily. Start taking on: March 18, 2020   dexamethasone 6 MG tablet Commonly known as: DECADRON Take 1 tablet (6 mg total) by mouth daily for 7 days. Start taking on: March 18, 2020   guaiFENesin-dextromethorphan 100-10 MG/5ML syrup Commonly known as: ROBITUSSIN DM Take 10 mLs by mouth every 4 (four) hours as needed for cough.   metoprolol tartrate 50 MG tablet Commonly known as: LOPRESSOR Take 1 tablet (50 mg total) by mouth 2 (two) times daily.   zinc sulfate 220 (50 Zn) MG  capsule Take 1 capsule (220 mg total) by mouth daily.            Durable Medical Equipment  (From admission, onward)         Start     Ordered   03/17/20 0849  For home use only DME oxygen  Once       Question Answer Comment  Length of Need 6 Months   Mode or (Route) Nasal cannula   Liters per Minute 2   Frequency Continuous (stationary and portable oxygen unit needed)   Oxygen conserving device Yes   Oxygen delivery system Gas      03/17/20 0848          Follow-up Information    Primary care provider. Schedule an appointment as soon as possible for a visit in 2 week(s).   Why: Hospital Follow Up              Not on File Allergies as of 03/17/2020   Not on File     Medication List    TAKE these medications   albuterol 108 (90 Base) MCG/ACT inhaler Commonly known as: VENTOLIN HFA Inhale 2 puffs into the lungs every 4 (four) hours as needed for wheezing or shortness of breath.   amLODipine 5 MG tablet Commonly known as: NORVASC Take 1 tablet (5 mg total) by mouth daily. Start taking on: March 18, 2020   ascorbic acid 500 MG tablet Commonly known as: VITAMIN C Take 1 tablet (500 mg total) by mouth daily. Start taking on: March 18, 2020   dexamethasone 6 MG tablet Commonly known as: DECADRON Take 1 tablet (6 mg total) by mouth daily for 7 days. Start taking on: March 18, 2020   guaiFENesin-dextromethorphan 100-10 MG/5ML syrup Commonly known as: ROBITUSSIN DM Take 10 mLs by mouth every 4 (four) hours as needed for cough.   metoprolol tartrate 50 MG tablet Commonly known as: LOPRESSOR Take 1 tablet (50 mg total) by mouth 2 (two) times daily.   zinc sulfate 220 (50 Zn) MG capsule Take 1 capsule (220 mg total) by mouth daily.            Durable Medical Equipment  (From admission, onward)         Start     Ordered   03/17/20 0849  For home use only DME oxygen  Once       Question Answer Comment  Length of Need 6 Months   Mode or  (Route) Nasal cannula   Liters per Minute 2   Frequency Continuous (stationary and portable oxygen unit needed)   Oxygen conserving device Yes   Oxygen delivery system Gas      03/17/20 0848          Procedures/Studies: DG Chest Portable 1 View  Result Date: 03/14/2020 CLINICAL DATA:  Shortness of breath EXAM: PORTABLE CHEST 1 VIEW COMPARISON:  None. FINDINGS: Patchy bilateral opacities with basilar predominance. No pleural effusion or pneumothorax. Cardiomediastinal contours are within normal limits  for portable technique. IMPRESSION: Patchy bilateral pulmonary opacities, which may reflect edema or pneumonia. Electronically Signed   By: Guadlupe Spanish M.D.   On: 03/14/2020 12:31      Subjective: Pt reports that he feels much better, he says his SOB is getting much better.  He is agreeable to going to outpatient remdesivir infusion center.   Discharge Exam: Vitals:   03/16/20 2121 03/17/20 0635  BP: (!) 164/118 (!) 141/104  Pulse: 84 72  Resp: 18 18  Temp: 98.1 F (36.7 C) 98.4 F (36.9 C)  SpO2: 97% 95%   Vitals:   03/16/20 1800 03/16/20 2012 03/16/20 2121 03/17/20 0635  BP: (!) 166/107  (!) 164/118 (!) 141/104  Pulse: 91  84 72  Resp: Temp:   98.1 F (36.7 C) 98.4 F (36.9 C)  TempSrc:   Oral Oral  SpO2: 98% 95% 97% 95%  Weight:      Height:       General: Pt is alert, awake, not in acute distress Cardiovascular: normal S1/S2 +, no rubs, no gallops Respiratory: no increased work of breathing, no wheezing, no rhonchi Abdominal: Soft, NT, ND, bowel sounds + Extremities: no edema, no cyanosis   The results of significant diagnostics from this hospitalization (including imaging, microbiology, ancillary and laboratory) are listed below for reference.     Microbiology: Recent Results (from the past 240 hour(s))  Resp Panel by RT-PCR (Flu A&B, Covid) Nasopharyngeal Swab     Status: Abnormal   Collection Time: 03/14/20 12:56 PM   Specimen:  Nasopharyngeal Swab; Nasopharyngeal(NP) swabs in vial transport medium  Result Value Ref Range Status   SARS Coronavirus 2 by RT PCR POSITIVE (A) NEGATIVE Final    Comment: RESULT CALLED TO, READ BACK BY AND VERIFIED WITH: HOLCOMB,R. AT 1434 ON 03/14/2020 BY BAUGHAM,M. (NOTE) SARS-CoV-2 target nucleic acids are DETECTED.  The SARS-CoV-2 RNA is generally detectable in upper respiratory specimens during the acute phase of infection. Positive results are indicative of the presence of the identified virus, but do not rule out bacterial infection or co-infection with other pathogens not detected by the test. Clinical correlation with patient history and other diagnostic information is necessary to determine patient infection status. The expected result is Negative.  Fact Sheet for Patients: BloggerCourse.com  Fact Sheet for Healthcare Providers: SeriousBroker.it  This test is not yet approved or cleared by the [place] FDA and  has been authorized for detection and/or diagnosis of SARS-CoV-2 by FDA under an Emergency Use Authorization (EUA).  This EUA will remain in effect (meaning thi s test can be used) for the duration of  the COVID-19 declaration under Section 564(b)(1) of the Act, 21 U.S.C. section 360bbb-3(b)(1), unless the authorization is terminated or revoked sooner.     Influenza A by PCR NEGATIVE NEGATIVE Final   Influenza B by PCR NEGATIVE NEGATIVE Final    Comment: (NOTE) The Xpert Xpress SARS-CoV-2/FLU/RSV plus assay is intended as an aid in the diagnosis of influenza from Nasopharyngeal swab specimens and should not be used as a sole basis for treatment. Nasal washings and aspirates are unacceptable for Xpert Xpress SARS-CoV-2/FLU/RSV testing.  Fact Sheet for Patients: BloggerCourse.com  Fact Sheet for Healthcare Providers: SeriousBroker.it  This test is not  yet approved or cleared by the [place] FDA and has been authorized for detection and/or diagnosis of SARS-CoV-2 by FDA under an Emergency Use Authorization (EUA). This EUA will remain in effect (meaning this test can be used)  for the duration of the COVID-19 declaration under Section 564(b)(1) of the Act, 21 U.S.C. section 360bbb-3(b)(1), unless the authorization is terminated or revoked.  Performed at [facility], [address], [place], [place] [address]   Blood Culture (routine x 2)     Status: None (Preliminary result)   Collection Time: 03/14/20  3:17 PM   Specimen: BLOOD  Result Value Ref Range Status   Specimen Description BLOOD RIGHT ANTECUBITAL  Final   Special Requests   Final    BOTTLES DRAWN AEROBIC AND ANAEROBIC Blood Culture adequate volume   Culture   Final    NO GROWTH 3 DAYS Performed at [facility], [address] [place], [place] [address]    Report Status PENDING  Incomplete  Blood Culture (routine x 2)     Status: None (Preliminary result)   Collection Time: 03/14/20  3:17 PM   Specimen: BLOOD  Result Value Ref Range Status   Specimen Description BLOOD LEFT ANTECUBITAL  Final   Special Requests   Final    BOTTLES DRAWN AEROBIC AND ANAEROBIC Blood Culture results may not be optimal due to an inadequate volume of blood received in culture bottles   Culture   Final    NO GROWTH 3 DAYS Performed at [facility], [address], [place], [place] [address]    Report Status PENDING  Incomplete     Labs: BNP (last 3 results) Recent Labs    03/14/20 1242  BNP 116.0*   Basic Metabolic Panel: Recent Labs  Lab 03/14/20 1242 03/16/20 0411 03/17/20 0424  NA 134* 137 138  K 3.9 3.9 4.3  CL 98 105 105  CO2 25 22 22   GLUCOSE 114* 127* 113*  BUN 24* 26* 28*  CREATININE 1.41* 1.31* 1.26*  CALCIUM 8.6* 8.4* 8.6*  MG  --  2.2 2.2  PHOS  --  3.6 3.7   Liver Function Tests: Recent Labs  Lab 03/14/20 1242 03/16/20 0411 03/17/20 0424  AST 42* 28 23   ALT 58* 47* 44  ALKPHOS 39 37* 42  BILITOT 0.7 0.6 0.5  PROT 7.4 6.3* 6.7  ALBUMIN 3.3* 2.7* 3.0*   No results for input(s): LIPASE, AMYLASE in the last 168 hours. No results for input(s): AMMONIA in the last 168 hours. CBC: Recent Labs  Lab 03/14/20 1242 03/16/20 0411 03/17/20 0424  WBC 10.9* 12.2* 14.6*  NEUTROABS 8.4* 8.7* 10.2*  HGB 14.9 13.6 14.6  HCT 46.0 42.3 45.5  MCV 89.1 90.6 90.6  PLT 259 289 334   Cardiac Enzymes: No results for input(s): CKTOTAL, CKMB, CKMBINDEX, TROPONINI in the last 168 hours. BNP: Invalid input(s): POCBNP CBG: No results for input(s): GLUCAP in the last 168 hours. D-Dimer Recent Labs    03/16/20 0411 03/17/20 0424  DDIMER 2.00* 1.90*   Hgb A1c No results for input(s): HGBA1C in the last 72 hours. Lipid Profile Recent Labs    03/14/20 1517  TRIG 105   Thyroid function studies No results for input(s): TSH, T4TOTAL, T3FREE, THYROIDAB in the last 72 hours.  Invalid input(s): FREET3 Anemia work up Recent Labs    03/16/20 0411 03/17/20 0424  FERRITIN 647* 600*   Urinalysis No results found for: COLORURINE, APPEARANCEUR, LABSPEC, PHURINE, GLUCOSEU, HGBUR, BILIRUBINUR, KETONESUR, PROTEINUR, UROBILINOGEN, NITRITE, LEUKOCYTESUR Sepsis Labs Invalid input(s): PROCALCITONIN,  WBC,  LACTICIDVEN Microbiology Recent Results (from the past 240 hour(s))  Resp Panel by RT-PCR (Flu A&B, Covid) Nasopharyngeal Swab     Status: Abnormal   Collection Time: 03/14/20  12:56 PM   Specimen: Nasopharyngeal Swab; Nasopharyngeal(NP) swabs in vial transport medium  Result Value Ref Range Status   SARS Coronavirus 2 by RT PCR POSITIVE (A) NEGATIVE Final    Comment: RESULT CALLED TO, READ BACK BY AND VERIFIED WITH: HOLCOMB,R. AT 1434 ON 03/14/2020 BY BAUGHAM,M. (NOTE) SARS-CoV-2 target nucleic acids are DETECTED.  The SARS-CoV-2 RNA is generally detectable in upper respiratory specimens during the acute phase of infection. Positive results  are indicative of the presence of the identified virus, but do not rule out bacterial infection or co-infection with other pathogens not detected by the test. Clinical correlation with patient history and other diagnostic information is necessary to determine patient infection status. The expected result is Negative.  Fact Sheet for Patients: BloggerCourse.com  Fact Sheet for Healthcare Providers: SeriousBroker.it  This test is not yet approved or cleared by the [place] FDA and  has been authorized for detection and/or diagnosis of SARS-CoV-2 by FDA under an Emergency Use Authorization (EUA).  This EUA will remain in effect (meaning thi s test can be used) for the duration of  the COVID-19 declaration under Section 564(b)(1) of the Act, 21 U.S.C. section 360bbb-3(b)(1), unless the authorization is terminated or revoked sooner.     Influenza A by PCR NEGATIVE NEGATIVE Final   Influenza B by PCR NEGATIVE NEGATIVE Final    Comment: (NOTE) The Xpert Xpress SARS-CoV-2/FLU/RSV plus assay is intended as an aid in the diagnosis of influenza from Nasopharyngeal swab specimens and should not be used as a sole basis for treatment. Nasal washings and aspirates are unacceptable for Xpert Xpress SARS-CoV-2/FLU/RSV testing.  Fact Sheet for Patients: BloggerCourse.com  Fact Sheet for Healthcare Providers: SeriousBroker.it  This test is not yet approved or cleared by the [place] FDA and has been authorized for detection and/or diagnosis of SARS-CoV-2 by FDA under an Emergency Use Authorization (EUA). This EUA will remain in effect (meaning this test can be used) for the duration of the COVID-19 declaration under Section 564(b)(1) of the Act, 21 U.S.C. section 360bbb-3(b)(1), unless the authorization is terminated or revoked.  Performed at [facility], [address]  [place], [place] [address]   Blood Culture (routine x 2)     Status: None (Preliminary result)   Collection Time: 03/14/20  3:17 PM   Specimen: BLOOD  Result Value Ref Range Status   Specimen Description BLOOD RIGHT ANTECUBITAL  Final   Special Requests   Final    BOTTLES DRAWN AEROBIC AND ANAEROBIC Blood Culture adequate volume   Culture   Final    NO GROWTH 3 DAYS Performed at [facility], [address] [place], [place] [address]    Report Status PENDING  Incomplete  Blood Culture (routine x 2)     Status: None (Preliminary result)   Collection Time: 03/14/20  3:17 PM   Specimen: BLOOD  Result Value Ref Range Status   Specimen Description BLOOD LEFT ANTECUBITAL  Final   Special Requests   Final    BOTTLES DRAWN AEROBIC AND ANAEROBIC Blood Culture results may not be optimal due to an inadequate volume of blood received in culture bottles   Culture   Final    NO GROWTH 3 DAYS Performed at [facility], [address], [place], [place] [address]    Report Status PENDING  Incomplete   Time coordinating discharge:  37 minutes   SIGNED:  Standley Dakins, MD  [facility] 03/17/2020, 10:07 AM How to contact the [facility] Attending or Consulting  provider [place] or covering provider during after hours [place], for this patient?  1. Check the care team in [facility] and look for a) attending/consulting [facility] provider listed and b) the [facility] team listed 2. Log into [email or web address] and use [place]'s universal password to access. If you do not have the password, please contact the hospital operator. 3. Locate the [facility] provider you are looking for under [facility] and page to a number that you can be directly reached. 4. If you still have difficulty reaching the provider, please page the [facility] (Director on Call) for the Hospitalists listed on amion for assistance.

## 2020-03-17 NOTE — Plan of Care (Signed)
  Problem: Education: Goal: Knowledge of risk factors and measures for prevention of condition will improve 03/17/2020 1038 by Zachery Dakins, RN Outcome: Adequate for Discharge 03/17/2020 0901 by Zachery Dakins, RN Outcome: Progressing   Problem: Coping: Goal: Psychosocial and spiritual needs will be supported 03/17/2020 1038 by Zachery Dakins, RN Outcome: Adequate for Discharge 03/17/2020 0901 by Zachery Dakins, RN Outcome: Progressing   Problem: Respiratory: Goal: Will maintain a patent airway 03/17/2020 1038 by Zachery Dakins, RN Outcome: Adequate for Discharge 03/17/2020 0901 by Zachery Dakins, RN Outcome: Progressing Goal: Complications related to the disease process, condition or treatment will be avoided or minimized 03/17/2020 1038 by Zachery Dakins, RN Outcome: Adequate for Discharge 03/17/2020 0901 by Zachery Dakins, RN Outcome: Progressing   Problem: Education: Goal: Knowledge of General Education information will improve Description: Including pain rating scale, medication(s)/side effects and non-pharmacologic comfort measures 03/17/2020 1038 by Zachery Dakins, RN Outcome: Adequate for Discharge 03/17/2020 0901 by Zachery Dakins, RN Outcome: Progressing   Problem: Clinical Measurements: Goal: Ability to maintain clinical measurements within normal limits will improve 03/17/2020 1038 by Zachery Dakins, RN Outcome: Adequate for Discharge 03/17/2020 0901 by Zachery Dakins, RN Outcome: Progressing Goal: Respiratory complications will improve 03/17/2020 1038 by Zachery Dakins, RN Outcome: Adequate for Discharge 03/17/2020 0901 by Zachery Dakins, RN Outcome: Progressing Goal: Cardiovascular complication will be avoided 03/17/2020 1038 by Zachery Dakins, RN Outcome: Adequate for Discharge 03/17/2020 0901 by Zachery Dakins, RN Outcome: Progressing   Problem: Activity: Goal: Risk  for activity intolerance will decrease 03/17/2020 1038 by Zachery Dakins, RN Outcome: Adequate for Discharge 03/17/2020 0901 by Zachery Dakins, RN Outcome: Progressing   Problem: Coping: Goal: Level of anxiety will decrease 03/17/2020 1038 by Zachery Dakins, RN Outcome: Adequate for Discharge 03/17/2020 0901 by Zachery Dakins, RN Outcome: Progressing   Problem: Elimination: Goal: Will not experience complications related to bowel motility 03/17/2020 1038 by Zachery Dakins, RN Outcome: Adequate for Discharge 03/17/2020 0901 by Zachery Dakins, RN Outcome: Progressing Goal: Will not experience complications related to urinary retention 03/17/2020 1038 by Zachery Dakins, RN Outcome: Adequate for Discharge 03/17/2020 0901 by Zachery Dakins, RN Outcome: Progressing   Problem: Pain Managment: Goal: General experience of comfort will improve 03/17/2020 1038 by Zachery Dakins, RN Outcome: Adequate for Discharge 03/17/2020 0901 by Zachery Dakins, RN Outcome: Progressing   Problem: Safety: Goal: Ability to remain free from injury will improve 03/17/2020 1038 by Zachery Dakins, RN Outcome: Adequate for Discharge 03/17/2020 0901 by Zachery Dakins, RN Outcome: Progressing   Problem: Skin Integrity: Goal: Risk for impaired skin integrity will decrease 03/17/2020 1038 by Zachery Dakins, RN Outcome: Adequate for Discharge 03/17/2020 0901 by Zachery Dakins, RN Outcome: Progressing

## 2020-03-17 NOTE — TOC Transition Note (Signed)
Transition of Care [facility]) - CM/SW Discharge Note   Patient Details  Name: Tony Meyer MRN: [id number] Date of Birth: 1970/08/03  Transition of Care [facility] CM/SW Contact:  Barry Brunner, LCSW Phone Number: 03/17/2020, 11:17 AM   Clinical Narrative:    CSW contacted patient to inquire about patient's PCP. Patient reported that he currently did not have a PCP. Patient agreeable receive PCP resources. CSW printed PCP resources for patient. Nurse agreeable to provide resources for patient. TOC signing off.    Final next level of care: Home/Self Care Barriers to Discharge: Barriers Resolved   Patient Goals and CMS Choice Patient states their goals for this hospitalization and ongoing recovery are:: Return home      Discharge Placement                Patient to be transferred to facility by: n/a Name of family member notified: n/a Patient and family notified of of transfer: 03/17/20  Discharge Plan and Services                                     Social Determinants of Health (SDOH) Interventions     Readmission Risk Interventions No flowsheet data found.

## 2020-03-18 ENCOUNTER — Ambulatory Visit (HOSPITAL_COMMUNITY)
Admit: 2020-03-18 | Discharge: 2020-03-18 | Disposition: A | Discharge status: Home / Self Care | Payer: 59 | Source: Ambulatory Visit | Attending: Pulmonary Disease | Admitting: Pulmonary Disease

## 2020-03-18 DIAGNOSIS — J1282 Pneumonia due to coronavirus disease 2019: Secondary | ICD-10-CM | POA: Diagnosis not present

## 2020-03-18 DIAGNOSIS — U071 COVID-19: Secondary | ICD-10-CM | POA: Diagnosis present

## 2020-03-18 MED ORDER — METHYLPREDNISOLONE SODIUM SUCC 125 MG IJ SOLR: 125.0000 mg | Freq: Once | INTRAMUSCULAR | Status: DC | PRN

## 2020-03-18 MED ORDER — SODIUM CHLORIDE 0.9 % IV SOLN
100.0000 mg | Freq: Once | INTRAVENOUS | Status: AC
  Administered 2020-03-18: 100 mg via INTRAVENOUS

## 2020-03-18 MED ORDER — FAMOTIDINE IN NACL 20-0.9 MG/50ML-% IV SOLN: 20.0000 mg | Freq: Once | INTRAVENOUS | Status: DC | PRN

## 2020-03-18 MED ORDER — ALBUTEROL SULFATE HFA 108 (90 BASE) MCG/ACT IN AERS: 2.0000 | INHALATION_SPRAY | Freq: Once | RESPIRATORY_TRACT | Status: DC | PRN

## 2020-03-18 MED ORDER — EPINEPHRINE 0.3 MG/0.3ML IJ SOAJ: 0.3000 mg | Freq: Once | INTRAMUSCULAR | Status: DC | PRN

## 2020-03-18 MED ORDER — SODIUM CHLORIDE 0.9 % IV SOLN: INTRAVENOUS | Status: DC | PRN

## 2020-03-18 MED ORDER — DIPHENHYDRAMINE HCL 50 MG/ML IJ SOLN: 50.0000 mg | Freq: Once | INTRAMUSCULAR | Status: DC | PRN

## 2020-03-18 NOTE — Progress Notes (Signed)
  Diagnosis: COVID-19  Physician:  Procedure: [facility] Med: remdesivir infusion - Provided patient with remdesivir fact sheet for patients, parents and caregivers prior to infusion.  Complications: No immediate complications noted.  Discharge: Discharged home   William Latorre 03/18/2020

## 2020-03-18 NOTE — Progress Notes (Signed)
RN informed pt's BP was elevated. Loa Socks, NP called and no new orders received. Will continue to monitor the patient.

## 2020-03-18 NOTE — Discharge Instructions (Signed)
10 Things You Can Do to Manage Your COVID-19 Symptoms at Home If you have possible or confirmed COVID-19: 1. Stay home from work and school. And stay away from other public places. If you must go out, avoid using any kind of public transportation, ridesharing, or taxis. 2. Monitor your symptoms carefully. If your symptoms get worse, call your healthcare provider immediately. 3. Get rest and stay hydrated. 4. If you have a medical appointment, call the healthcare provider ahead of time and tell them that you have or may have COVID-19. 5. For medical emergencies, call 911 and notify the dispatch personnel that you have or may have COVID-19. 6. Cover your cough and sneezes with a tissue or use the inside of your elbow. 7. Wash your hands often with soap and water for at least 20 seconds or clean your hands with an alcohol-based hand sanitizer that contains at least 60% alcohol. 8. As much as possible, stay in a specific room and away from other people in your home. Also, you should use a separate bathroom, if available. If you need to be around other people in or outside of the home, wear a mask. 9. Avoid sharing personal items with other people in your household, like dishes, towels, and bedding. 10. Clean all surfaces that are touched often, like counters, tabletops, and doorknobs. Use household cleaning sprays or wipes according to the label instructions. cdc.gov/coronavirus 10/14/2018 This information is not intended to replace advice given to you by your health care provider. Make sure you discuss any questions you have with your health care provider. Document Revised: 03/18/2019 Document Reviewed: 03/18/2019 Elsevier Patient Education  2020 Elsevier Inc. What types of side effects do monoclonal antibody drugs cause?  Common side effects  In general, the more common side effects caused by monoclonal antibody drugs include: . Allergic reactions, such as hives or itching . Flu-like signs and  symptoms, including chills, fatigue, fever, and muscle aches and pains . Nausea, vomiting . Diarrhea . Skin rashes . Low blood pressure   The CDC is recommending patients who receive monoclonal antibody treatments wait at least 90 days before being vaccinated.  Currently, there are no data on the safety and efficacy of mRNA COVID-19 vaccines in persons who received monoclonal antibodies or convalescent plasma as part of COVID-19 treatment. Based on the estimated half-life of such therapies as well as evidence suggesting that reinfection is uncommon in the 90 days after initial infection, vaccination should be deferred for at least 90 days, as a precautionary measure until additional information becomes available, to avoid interference of the antibody treatment with vaccine-induced immune responses. If you have any questions or concerns after the infusion please call the Advanced Practice Provider on call at [phone]. This number is ONLY intended for your use regarding questions or concerns about the infusion post-treatment side-effects.  Please do not provide this number to others for use. For return to work notes please contact your primary care provider.   If someone you know is interested in receiving treatment please have them call the COVID hotline at [phone].   

## 2020-03-18 NOTE — Progress Notes (Addendum)
Patient reviewed Fact Sheet for Patients, Parents, and Caregivers for Emergency Use Authorization (EUA) of Remdesivir for the Treatment of Coronavirus. Patient also reviewed and is agreeable to the estimated cost of treatment. Patient is agreeable to proceed.   

## 2020-03-19 LAB — CULTURE, BLOOD (ROUTINE X 2)
Culture: NO GROWTH
Culture: NO GROWTH
Special Requests: ADEQUATE

## 2020-06-13 DIAGNOSIS — I5020 Unspecified systolic (congestive) heart failure: Secondary | ICD-10-CM

## 2020-06-13 HISTORY — DX: Unspecified systolic (congestive) heart failure: I50.20

## 2020-07-13 ENCOUNTER — Other Ambulatory Visit: Payer: Self-pay

## 2020-07-13 ENCOUNTER — Encounter (HOSPITAL_COMMUNITY): Payer: Self-pay | Admitting: Emergency Medicine

## 2020-07-13 ENCOUNTER — Emergency Department (HOSPITAL_COMMUNITY): Payer: 59

## 2020-07-13 ENCOUNTER — Inpatient Hospital Stay (HOSPITAL_COMMUNITY)
Admission: EM | Admit: 2020-07-13 | Discharge: 2020-07-21 | DRG: 286 | Disposition: A | Discharge status: Home / Self Care | Payer: 59 | Attending: Internal Medicine | Admitting: Internal Medicine

## 2020-07-13 DIAGNOSIS — N179 Acute kidney failure, unspecified: Secondary | ICD-10-CM | POA: Diagnosis present

## 2020-07-13 DIAGNOSIS — Z28310 Unvaccinated for covid-19: Secondary | ICD-10-CM

## 2020-07-13 DIAGNOSIS — I428 Other cardiomyopathies: Secondary | ICD-10-CM | POA: Diagnosis present

## 2020-07-13 DIAGNOSIS — I509 Heart failure, unspecified: Secondary | ICD-10-CM

## 2020-07-13 DIAGNOSIS — I130 Hypertensive heart and chronic kidney disease with heart failure and stage 1 through stage 4 chronic kidney disease, or unspecified chronic kidney disease: Secondary | ICD-10-CM | POA: Diagnosis not present

## 2020-07-13 DIAGNOSIS — R778 Other specified abnormalities of plasma proteins: Secondary | ICD-10-CM | POA: Diagnosis not present

## 2020-07-13 DIAGNOSIS — I5023 Acute on chronic systolic (congestive) heart failure: Secondary | ICD-10-CM | POA: Diagnosis present

## 2020-07-13 DIAGNOSIS — I10 Essential (primary) hypertension: Secondary | ICD-10-CM | POA: Diagnosis present

## 2020-07-13 DIAGNOSIS — I5082 Biventricular heart failure: Secondary | ICD-10-CM | POA: Diagnosis present

## 2020-07-13 DIAGNOSIS — Z8616 Personal history of COVID-19: Secondary | ICD-10-CM

## 2020-07-13 DIAGNOSIS — I5021 Acute systolic (congestive) heart failure: Secondary | ICD-10-CM | POA: Diagnosis present

## 2020-07-13 DIAGNOSIS — Z79899 Other long term (current) drug therapy: Secondary | ICD-10-CM

## 2020-07-13 DIAGNOSIS — E440 Moderate protein-calorie malnutrition: Secondary | ICD-10-CM | POA: Diagnosis present

## 2020-07-13 DIAGNOSIS — J811 Chronic pulmonary edema: Secondary | ICD-10-CM

## 2020-07-13 DIAGNOSIS — N189 Chronic kidney disease, unspecified: Secondary | ICD-10-CM

## 2020-07-13 DIAGNOSIS — N1831 Chronic kidney disease, stage 3a: Secondary | ICD-10-CM | POA: Diagnosis present

## 2020-07-13 HISTORY — DX: Disorder of kidney and ureter, unspecified: N28.9

## 2020-07-13 HISTORY — DX: Heart failure, unspecified: I50.9

## 2020-07-13 HISTORY — DX: Essential (primary) hypertension: I10

## 2020-07-13 LAB — CBC WITH DIFFERENTIAL/PLATELET
Abs Immature Granulocytes: 0.02 10*3/uL (ref 0.00–0.07)
Basophils Absolute: 0.1 10*3/uL (ref 0.0–0.1)
Basophils Relative: 1 %
Eosinophils Absolute: 0.1 10*3/uL (ref 0.0–0.5)
Eosinophils Relative: 1 %
HCT: 42.5 % (ref 39.0–52.0)
Hemoglobin: 13.2 g/dL (ref 13.0–17.0)
Immature Granulocytes: 0 %
Lymphocytes Relative: 22 %
Lymphs Abs: 1.8 10*3/uL (ref 0.7–4.0)
MCH: 27.5 pg (ref 26.0–34.0)
MCHC: 31.1 g/dL (ref 30.0–36.0)
MCV: 88.5 fL (ref 80.0–100.0)
Monocytes Absolute: 0.7 10*3/uL (ref 0.1–1.0)
Monocytes Relative: 9 %
Neutro Abs: 5.6 10*3/uL (ref 1.7–7.7)
Neutrophils Relative %: 67 %
Platelets: 227 10*3/uL (ref 150–400)
RBC: 4.8 MIL/uL (ref 4.22–5.81)
RDW: 15.7 % — ABNORMAL HIGH (ref 11.5–15.5)
WBC: 8.3 10*3/uL (ref 4.0–10.5)
nRBC: 0 % (ref 0.0–0.2)

## 2020-07-13 LAB — BASIC METABOLIC PANEL
Anion gap: 9 (ref 5–15)
BUN: 18 mg/dL (ref 6–20)
CO2: 23 mmol/L (ref 22–32)
Calcium: 8.6 mg/dL — ABNORMAL LOW (ref 8.9–10.3)
Chloride: 102 mmol/L (ref 98–111)
Creatinine, Ser: 1.56 mg/dL — ABNORMAL HIGH (ref 0.61–1.24)
GFR, Estimated: 53 mL/min — ABNORMAL LOW (ref >60)
Glucose, Bld: 115 mg/dL — ABNORMAL HIGH (ref 70–99)
Potassium: 4.1 mmol/L (ref 3.5–5.1)
Sodium: 134 mmol/L — ABNORMAL LOW (ref 135–145)

## 2020-07-13 LAB — URINALYSIS, ROUTINE W REFLEX MICROSCOPIC
Bacteria, UA: NONE SEEN
Bilirubin Urine: NEGATIVE
Glucose, UA: NEGATIVE mg/dL
Hgb urine dipstick: NEGATIVE
Ketones, ur: NEGATIVE mg/dL
Leukocytes,Ua: NEGATIVE
Nitrite: NEGATIVE
Protein, ur: 100 mg/dL — AB
Specific Gravity, Urine: 1.018 (ref 1.005–1.030)
pH: 5 (ref 5.0–8.0)

## 2020-07-13 LAB — HEPATIC FUNCTION PANEL
ALT: 70 U/L — ABNORMAL HIGH (ref 0–44)
AST: 44 U/L — ABNORMAL HIGH (ref 15–41)
Albumin: 2.9 g/dL — ABNORMAL LOW (ref 3.5–5.0)
Alkaline Phosphatase: 63 U/L (ref 38–126)
Bilirubin, Direct: 0.3 mg/dL — ABNORMAL HIGH (ref 0.0–0.2)
Indirect Bilirubin: 0.7 mg/dL (ref 0.3–0.9)
Total Bilirubin: 1 mg/dL (ref 0.3–1.2)
Total Protein: 6.1 g/dL — ABNORMAL LOW (ref 6.5–8.1)

## 2020-07-13 LAB — MAGNESIUM: Magnesium: 1.9 mg/dL (ref 1.7–2.4)

## 2020-07-13 LAB — TROPONIN I (HIGH SENSITIVITY): Troponin I (High Sensitivity): 64 ng/L — ABNORMAL HIGH (ref <18)

## 2020-07-13 LAB — BRAIN NATRIURETIC PEPTIDE: B Natriuretic Peptide: 1510 pg/mL — ABNORMAL HIGH (ref 0.0–100.0)

## 2020-07-13 LAB — RESP PANEL BY RT-PCR (FLU A&B, COVID) ARPGX2
Influenza A by PCR: NEGATIVE
Influenza B by PCR: NEGATIVE
SARS Coronavirus 2 by RT PCR: NEGATIVE

## 2020-07-13 MED ORDER — FUROSEMIDE 10 MG/ML IJ SOLN
40.0000 mg | Freq: Once | INTRAMUSCULAR | Status: AC
  Administered 2020-07-13: 40 mg via INTRAVENOUS
  Filled 2020-07-13: qty 4

## 2020-07-13 MED ORDER — METOPROLOL TARTRATE 50 MG PO TABS
50.0000 mg | ORAL_TABLET | Freq: Once | ORAL | Status: AC
  Administered 2020-07-13: 50 mg via ORAL
  Filled 2020-07-13: qty 1

## 2020-07-13 NOTE — ED Provider Notes (Signed)
[facility] EMERGENCY DEPARTMENT Provider Note   CSN: [id number] Arrival date & time: 07/13/20  1707     History Chief Complaint  Patient presents with  . Shortness of Breath    Tony Meyer is a 50 y.o. male who presents with concern for shortness of breath that has been persistent since his discharge from the hospital in December 2021 secondary to COVID-19 related hypoxia.  However he states that the shortness of breath has worsened significantly over the last 2 months and he is now having bilateral lower extremity swelling, and is short of breath with any activity at all and even at rest at times.  He denies any palpitations, chest pain, nausea, vomiting, diarrhea.  He does endorse that he feels his belly is tight and distended.  He endorses normal bowel movements, most recently today and denies any urinary symptoms.  Denies any fevers or chills at home.  He has not been vaccinated against COVID-19.  I personally reviewed this patient's medical records.  His history of hospitalization secondary to COVID-19 pneumonia with hypoxia, history of hypertension, however he does not take any medications for his hypertension since he has been discharged in the hospital as he felt it may have been contributing to his shortness of breath.  He denies any recent prolonged travel or immobilization, denies recent surgical procedure, history of blood clot or malignancy.   HPI     History reviewed. No pertinent past medical history.  Patient Active Problem List   Diagnosis Date Noted  . Hypoxia 03/15/2020  . Essential hypertension 03/15/2020  . AKI (acute kidney injury) (HCC) 03/15/2020  . Pneumonia due to COVID-19 virus 03/14/2020    Past Surgical History:  Procedure Laterality Date  . HERNIA REPAIR         No family history on file.  Social History   Tobacco Use  . Smoking status: Never Smoker  . Smokeless tobacco: Never Used  Vaping Use  . Vaping Use: Never used  Substance  Use Topics  . Alcohol use: Never  . Drug use: Never    Home Medications Prior to Admission medications   Medication Sig Start Date End Date Taking? Authorizing Provider  albuterol (VENTOLIN HFA) 108 (90 Base) MCG/ACT inhaler Inhale 2 puffs into the lungs every 4 (four) hours as needed for wheezing or shortness of breath. 03/17/20  Yes Johnson, Clanford L, MD  amLODipine (NORVASC) 5 MG tablet Take 1 tablet (5 mg total) by mouth daily. 03/18/20 04/17/20  Johnson, Clanford L, MD  ascorbic acid (VITAMIN C) 500 MG tablet Take 1 tablet (500 mg total) by mouth daily. 03/18/20   Johnson, Clanford L, MD  guaiFENesin-dextromethorphan (ROBITUSSIN DM) 100-10 MG/5ML syrup Take 10 mLs by mouth every 4 (four) hours as needed for cough. 03/17/20   Johnson, Clanford L, MD  metoprolol tartrate (LOPRESSOR) 50 MG tablet Take 1 tablet (50 mg total) by mouth 2 (two) times daily. Patient not taking: No sig reported 03/17/20   Cleora Fleet, MD    Allergies    Patient has no known allergies.  Review of Systems   Review of Systems  Constitutional: Positive for activity change and fatigue. Negative for appetite change, chills, diaphoresis and fever.  HENT: Negative.   Eyes: Negative.   Respiratory: Positive for cough and shortness of breath. Negative for chest tightness and wheezing.   Cardiovascular: Positive for leg swelling. Negative for chest pain and palpitations.  Gastrointestinal: Negative for abdominal pain, diarrhea, nausea and vomiting.  Genitourinary:  Negative for decreased urine volume, difficulty urinating, dysuria, hematuria and urgency.  Musculoskeletal: Negative.   Skin: Negative.   Neurological: Negative.   Hematological: Negative.     Physical Exam Updated Vital Signs BP (!) 115/91   Pulse 91   Temp 97.8 F (36.6 C) (Oral)   Resp (!) 27   Ht 6\' 2"  (1.88 m)   Wt 102.1 kg   SpO2 96%   BMI 28.89 kg/m   Physical Exam Vitals and nursing note reviewed.  Constitutional:       Appearance: He is overweight. He is not ill-appearing.  HENT:     Head: Normocephalic and atraumatic.     Nose: Nose normal.     Mouth/Throat:     Mouth: Mucous membranes are moist.     Pharynx: Oropharynx is clear. Uvula midline. No oropharyngeal exudate or posterior oropharyngeal erythema.     Tonsils: No tonsillar exudate.  Eyes:     General: Lids are normal. Vision grossly intact.        Right eye: No discharge.        Left eye: No discharge.     Extraocular Movements: Extraocular movements intact.     Conjunctiva/sclera: Conjunctivae normal.     Pupils: Pupils are equal, round, and reactive to light.  Neck:     Trachea: Trachea and phonation normal.  Cardiovascular:     Rate and Rhythm: Regular rhythm. Tachycardia present.     Pulses:          Radial pulses are 2+ on the right side and 2+ on the left side.       Dorsalis pedis pulses are 1+ on the right side and 1+ on the left side.     Heart sounds: Normal heart sounds. No murmur heard.   Pulmonary:     Effort: Pulmonary effort is normal. Tachypnea present. No respiratory distress.     Breath sounds: Examination of the right-lower field reveals decreased breath sounds and rales. Examination of the left-lower field reveals decreased breath sounds and rales. Decreased breath sounds and rales present. No wheezing.  Chest:     Chest wall: No mass, tenderness, crepitus or edema.  Abdominal:     General: Bowel sounds are normal. There is distension.     Palpations: Abdomen is soft. There is no mass or pulsatile mass.     Tenderness: There is no abdominal tenderness.  Musculoskeletal:        General: No deformity.     Cervical back: Normal range of motion and neck supple. No rigidity or crepitus. No pain with movement, spinous process tenderness or muscular tenderness.     Right lower leg: No tenderness. 2+ Pitting Edema present.     Left lower leg: No tenderness. 2+ Pitting Edema present.  Lymphadenopathy:     Cervical: No  cervical adenopathy.  Skin:    General: Skin is warm and dry.     Capillary Refill: Capillary refill takes less than 2 seconds.  Neurological:     General: No focal deficit present.     Mental Status: He is alert and oriented to person, place, and time. Mental status is at baseline.     Sensory: Sensation is intact.     Motor: Motor function is intact.     Gait: Gait is intact.  Psychiatric:        Mood and Affect: Mood normal.     ED Results / Procedures / Treatments   Labs (all labs ordered are  listed, but only abnormal results are displayed) Labs Reviewed  BASIC METABOLIC PANEL - Abnormal; Notable for the following components:      Result Value   Sodium 134 (*)    Glucose, Bld 115 (*)    Creatinine, Ser 1.56 (*)    Calcium 8.6 (*)    GFR, Estimated 53 (*)    All other components within normal limits  HEPATIC FUNCTION PANEL - Abnormal; Notable for the following components:   Total Protein 6.1 (*)    Albumin 2.9 (*)    AST 44 (*)    ALT 70 (*)    Bilirubin, Direct 0.3 (*)    All other components within normal limits  BRAIN NATRIURETIC PEPTIDE - Abnormal; Notable for the following components:   B Natriuretic Peptide 1,510.0 (*)    All other components within normal limits  CBC WITH DIFFERENTIAL/PLATELET - Abnormal; Notable for the following components:   RDW 15.7 (*)    All other components within normal limits  URINALYSIS, ROUTINE W REFLEX MICROSCOPIC - Abnormal; Notable for the following components:   Protein, ur 100 (*)    All other components within normal limits  TROPONIN I (HIGH SENSITIVITY) - Abnormal; Notable for the following components:   Troponin I (High Sensitivity) 64 (*)    All other components within normal limits  RESP PANEL BY RT-PCR (FLU A&B, COVID) ARPGX2  MAGNESIUM  TROPONIN I (HIGH SENSITIVITY)    EKG EKG: sinus tachycardia, no STEMI.  Radiology DG Chest 2 View  Result Date: 07/13/2020 CLINICAL DATA:  Worsening shortness of breath. Patient  was diagnosed with COVID-19 in December 2021. EXAM: CHEST - 2 VIEW COMPARISON:  March 14, 2020 FINDINGS: The cardiac silhouette is enlarged. Mediastinal contours appear intact. Scattered nodular and linear airspace opacities throughout the bilateral lung fields. No evidence of pleural effusion or pneumothorax. Osseous structures are without acute abnormality. Soft tissues are grossly normal. IMPRESSION: 1. Scattered nodular and linear airspace opacities throughout the bilateral lung fields. Findings may represent diffuse airspace disease or early mixed pattern pulmonary edema. 2. Enlarged cardiac silhouette. Electronically Signed   By: Ted Mcalpine M.D.   On: 07/13/2020 20:53    Procedures Procedures   Medications Ordered in ED Medications  metoprolol tartrate (LOPRESSOR) tablet 50 mg (50 mg Oral Given 07/13/20 2108)  furosemide (LASIX) injection 40 mg (40 mg Intravenous Given 07/13/20 2255)    ED Course  I have reviewed the triage vital signs and the nursing notes.  Pertinent labs & imaging results that were available during my care of the patient were reviewed by me and considered in my medical decision making (see chart for details).  Clinical Course as of 07/13/20 2316  Thu Jul 13, 2020  2245 Consult to hospitalist Dr. Carren Rang, Who is agreeable to seeing this patient and admitting him to her service.  Appreciate her collaboration in the care of this patient.  IV Lasix ordered, delta troponin pending. [RS]    Clinical Course User Index [RS] Farin Buhman, Idelia Salm   MDM Rules/Calculators/A&P                          50 year old male with 2 months of progressively worsening shortness of breath now with lower extremity edema and abdominal distention, dyspnea is worse on exertion.  Differential diagnosis for this patient's symptoms includes but is not limited to ACS, arrhythmia, PE, pneumonia, pulmonary edema/CHF, pleural effusion, reactive airway disease, anemia, COPD  exacerbation, thyroid disease.  Tachycardic and hypertensive on intake, tachypneic.  Vital signs otherwise normal.  Oxygen saturation is 99% on room air.  Cardiac exam revealed sinus tachycardia, pulmonary exam revealed rales and diminished breath sounds in the lung bases bilaterally.  Abdominal exam revealed distention, without tenderness to palpation.  Bilateral lower extremity 2+ pitting edema with palpable pedal pulses.  We will proceed with basic laboratory studies, BNP, respiratory pathogen panel, chest x-ray, and EKG.  CBC unremarkable, BMP with AKI with creatinine 1.5, patient baseline of 1.2.  Hepatic function panel with mild transaminitis AST/ALT 44/70.  Mild elevation of indirect bilirubin to 0.3.  Troponin elevated to 64, delta troponin pending.  BNP elevated to 1510. Chest x-ray with scattered nodular and linear airspace opacities throughout the bilateral lung fields.   This patient appears to be in new cardiac failure.  Will require admission to the hospital.  IV Lasix ordered. Consult to hospitalist as above.  Victory voiced understanding of his medical evaluation and treatment plan. Each of his questions were answered to his expressed satisfaction. He is amenable to plan for admission at this time.   This chart was dictated using voice recognition software, Dragon. Despite the best efforts of this provider to proofread and correct errors, errors may still occur which can change documentation meaning.  Final Clinical Impression(s) / ED Diagnoses Final diagnoses:  None    Rx / DC Orders ED Discharge Orders    None       Sherrilee Gilles 07/13/20 2316    Bethann Berkshire, MD 07/16/20 1611

## 2020-07-13 NOTE — ED Notes (Signed)
Lab contacted for 2nd troponin

## 2020-07-13 NOTE — H&P (Signed)
TRH H&P    Patient Demographics:    Tony Meyer, is a 50 y.o. male  MRN: [id number]  DOB - 03-02-1971  Admit Date - 07/13/2020  Referring MD/NP/PA: Kandace Parkins  Outpatient Primary MD for the patient is Patient, No Pcp Per (Inactive)  Patient coming from: dyspnea  Chief complaint- dyspnea and lower extremity edema    HPI:    Tony Meyer  is a 50 y.o. male, with history of admission for Covid in December, hypertension, presents to the ED with a chief complaint of shortness of breath.  Patient reports that he has been short of breath since his discharge in December 2021.  He reports he stopped taking his medications because he thought that they were making him short of breath.  Early last 3 weeks he has had an acute change with worsening shortness of breath, and tightness in his abdomen.  Over the last week he has had increased peripheral edema.  Patient reports that he has had an increase in fatigue with exertion.  He denies any chest pain, palpitations, nausea, vomiting.  He does report a decrease in urinary output over the past few days.  He attributes this to the fact that he has been drinking less water because he has been trying to prevent the swelling.  Patient reports no dysuria or hematuria.  Patient has no history of CHF.  He does not follow any diet, and reports that he definitely does not follow a low-sodium diet.  Patient does not check his blood pressure at home, in the setting of not taking his medications his blood pressure on admission was 135/101.  Patient has no other complaints at this time. Patient does not smoke, does not drink, does not use illicit drugs.  Patient is not vaccinated for COVID.  Patient is full code  In the ED Temp 97.8 heart rate 1 13-118, respiratory 21-33, blood pressure 135/101, satting 91% on room air White blood cell count 8.3, hemoglobin 13.2 Chemistry panel reveals an  increase in creatinine to 1.56 Troponin is downtrending from 64-60 Negative COVID BNP was 1510 X-ray shows scattered nodular and linear airspace opacities throughout bilateral lung fields indicative of pulmonary edema Patient was given Lopressor 50 mg tab in the ED and 40 mg of Lasix Admission requested for new onset CHF    Review of systems:    In addition to the HPI above,  No Fever-chills, No Headache, No changes with Vision or hearing, No problems swallowing food or Liquids, No Chest pain, Cough admits to dyspnea No Abdominal pain, No Nausea or Vomiting, bowel movements are regular, No Blood in stool or Urine, No dysuria, No new skin rashes or bruises, No new joints pains-aches,  No new weakness, tingling, numbness in any extremity, No polyuria, polydypsia or polyphagia, No significant Mental Stressors.  All other systems reviewed and are negative.    Past History of the following :    History reviewed. No pertinent past medical history.    Past Surgical History:  Procedure Laterality Date  . HERNIA REPAIR  Social History:      Social History   Tobacco Use  . Smoking status: Never Smoker  . Smokeless tobacco: Never Used  Substance Use Topics  . Alcohol use: Never       Family History :    No family history on file. Family history hypertension   Home Medications:   Prior to Admission medications   Medication Sig Start Date End Date Taking? Authorizing Provider  albuterol (VENTOLIN HFA) 108 (90 Base) MCG/ACT inhaler Inhale 2 puffs into the lungs every 4 (four) hours as needed for wheezing or shortness of breath. 03/17/20  Yes Johnson, Clanford L, MD  amLODipine (NORVASC) 5 MG tablet Take 1 tablet (5 mg total) by mouth daily. 03/18/20 04/17/20  Johnson, Clanford L, MD  ascorbic acid (VITAMIN C) 500 MG tablet Take 1 tablet (500 mg total) by mouth daily. 03/18/20   Johnson, Clanford L, MD  guaiFENesin-dextromethorphan (ROBITUSSIN DM) 100-10 MG/5ML  syrup Take 10 mLs by mouth every 4 (four) hours as needed for cough. 03/17/20   Johnson, Clanford L, MD  metoprolol tartrate (LOPRESSOR) 50 MG tablet Take 1 tablet (50 mg total) by mouth 2 (two) times daily. Patient not taking: No sig reported 03/17/20   Cleora Fleet, MD     Allergies:    No Known Allergies   Physical Exam:   Vitals  Blood pressure (!) 135/101, pulse (!) 113, temperature 97.8 F (36.6 C), temperature source Oral, resp. rate (!) 33, height 6\' 2"  (1.88 m), weight 102.1 kg, SpO2 91 %.  1.  General: Patient lying supine in bed with head of bed elevated, no acute distress  2. Psychiatric: Mood and behavior normal for situation, pleasant, cooperative with exam  3. Neurologic: Face is symmetric, speech and language are normal, moves all 4 extremities voluntarily  4. HEENMT:  Head is atraumatic, normocephalic, pupils reactive to light, neck is supple, trachea is midline, mucous membranes are moist, poor dentition  5. Respiratory : Fine crackles in the lower lung fields, no wheezes, no rhonchi, no clubbing, no cyanosis  6. Cardiovascular : Heart rate is slightly tachycardic, rhythm is regular, no murmurs rubs or gallops, peripheral edema up to mid thigh  7. Gastrointestinal:  Abdomen taut, no guarding, no rebound tenderness, not rigid, abdomen is distended, nontender to palpation  8. Skin:  Skin is warm dry and intact  9.Musculoskeletal:  Peripheral edema as described above, no calf tenderness, no acute deformity    Data Review:    CBC Recent Labs  Lab 07/13/20 2020  WBC 8.3  HGB 13.2  HCT 42.5  PLT 227  MCV 88.5  MCH 27.5  MCHC 31.1  RDW 15.7*  LYMPHSABS 1.8  MONOABS 0.7  EOSABS 0.1  BASOSABS 0.1   ------------------------------------------------------------------------------------------------------------------  Results for orders placed or performed during the hospital encounter of 07/13/20 (from the past 48 hour(s))  Resp Panel by  RT-PCR (Flu A&B, Covid) Nasopharyngeal Swab     Status: None   Collection Time: 07/13/20  8:03 PM   Specimen: Nasopharyngeal Swab; Nasopharyngeal(NP) swabs in vial transport medium  Result Value Ref Range   SARS Coronavirus 2 by RT PCR NEGATIVE NEGATIVE    Comment: (NOTE) SARS-CoV-2 target nucleic acids are NOT DETECTED.  The SARS-CoV-2 RNA is generally detectable in upper respiratory specimens during the acute phase of infection. The lowest concentration of SARS-CoV-2 viral copies this assay can detect is 138 copies/mL. A negative result does not preclude SARS-Cov-2 infection and should not be used as the  sole basis for treatment or other patient management decisions. A negative result may occur with  improper specimen collection/handling, submission of specimen other than nasopharyngeal swab, presence of viral mutation(s) within the areas targeted by this assay, and inadequate number of viral copies(<138 copies/mL). A negative result must be combined with clinical observations, patient history, and epidemiological information. The expected result is Negative.  Fact Sheet for Patients:  BloggerCourse.com  Fact Sheet for Healthcare Providers:  SeriousBroker.it  This test is no t yet approved or cleared by the [place] FDA and  has been authorized for detection and/or diagnosis of SARS-CoV-2 by FDA under an Emergency Use Authorization (EUA). This EUA will remain  in effect (meaning this test can be used) for the duration of the COVID-19 declaration under Section 564(b)(1) of the Act, 21 U.S.C.section 360bbb-3(b)(1), unless the authorization is terminated  or revoked sooner.       Influenza A by PCR NEGATIVE NEGATIVE   Influenza B by PCR NEGATIVE NEGATIVE    Comment: (NOTE) The Xpert Xpress SARS-CoV-2/FLU/RSV plus assay is intended as an aid in the diagnosis of influenza from Nasopharyngeal swab specimens and should not be  used as a sole basis for treatment. Nasal washings and aspirates are unacceptable for Xpert Xpress SARS-CoV-2/FLU/RSV testing.  Fact Sheet for Patients: BloggerCourse.com  Fact Sheet for Healthcare Providers: SeriousBroker.it  This test is not yet approved or cleared by the [place] FDA and has been authorized for detection and/or diagnosis of SARS-CoV-2 by FDA under an Emergency Use Authorization (EUA). This EUA will remain in effect (meaning this test can be used) for the duration of the COVID-19 declaration under Section 564(b)(1) of the Act, 21 U.S.C. section 360bbb-3(b)(1), unless the authorization is terminated or revoked.  Performed at [facility], [address], [place], [place] [address]   Basic metabolic panel     Status: Abnormal   Collection Time: 07/13/20  8:20 PM  Result Value Ref Range   Sodium 134 (L) 135 - 145 mmol/L   Potassium 4.1 3.5 - 5.1 mmol/L   Chloride 102 98 - 111 mmol/L   CO2 23 22 - 32 mmol/L   Glucose, Bld 115 (H) 70 - 99 mg/dL    Comment: Glucose reference range applies only to samples taken after fasting for at least 8 hours.   BUN 18 6 - 20 mg/dL   Creatinine, Ser 6.23 (H) 0.61 - 1.24 mg/dL   Calcium 8.6 (L) 8.9 - 10.3 mg/dL   GFR, Estimated 53 (L) >60 mL/min    Comment: (NOTE) Calculated using the CKD-EPI Creatinine Equation (2021)    Anion gap 9 5 - 15    Comment: Performed at [facility], [address] [place], [place] [address]  Hepatic function panel     Status: Abnormal   Collection Time: 07/13/20  8:20 PM  Result Value Ref Range   Total Protein 6.1 (L) 6.5 - 8.1 g/dL   Albumin 2.9 (L) 3.5 - 5.0 g/dL   AST 44 (H) 15 - 41 U/L   ALT 70 (H) 0 - 44 U/L   Alkaline Phosphatase 63 38 - 126 U/L   Total Bilirubin 1.0 0.3 - 1.2 mg/dL   Bilirubin, Direct 0.3 (H) 0.0 - 0.2 mg/dL   Indirect Bilirubin 0.7 0.3 - 0.9 mg/dL    Comment: Performed at [facility], [address],  [place], [place] [address]  CBC with Differential     Status: Abnormal   Collection Time: 07/13/20  8:20 PM  Result Value Ref Range   WBC 8.3 4.0 - 10.5 K/uL   RBC 4.80 4.22 - 5.81 MIL/uL   Hemoglobin 13.2 13.0 - 17.0 g/dL   HCT 16.142.5 09.639.0 - 04.552.0 %   MCV 88.5 80.0 - 100.0 fL   MCH 27.5 26.0 - 34.0 pg   MCHC 31.1 30.0 - 36.0 g/dL   RDW 40.915.7 (H) 81.111.5 - 91.415.5 %   Platelets 227 150 - 400 K/uL   nRBC 0.0 0.0 - 0.2 %   Neutrophils Relative % 67 %   Neutro Abs 5.6 1.7 - 7.7 K/uL   Lymphocytes Relative 22 %   Lymphs Abs 1.8 0.7 - 4.0 K/uL   Monocytes Relative 9 %   Monocytes Absolute 0.7 0.1 - 1.0 K/uL   Eosinophils Relative 1 %   Eosinophils Absolute 0.1 0.0 - 0.5 K/uL   Basophils Relative 1 %   Basophils Absolute 0.1 0.0 - 0.1 K/uL   Immature Granulocytes 0 %   Abs Immature Granulocytes 0.02 0.00 - 0.07 K/uL    Comment: Performed at [facility], [address] [place], [place] [address]  Troponin I (High Sensitivity)     Status: Abnormal   Collection Time: 07/13/20  8:20 PM  Result Value Ref Range   Troponin I (High Sensitivity) 64 (H) <18 ng/L    Comment: (NOTE) Elevated high sensitivity troponin I (hsTnI) values and significant  changes across serial measurements may suggest ACS but many other  chronic and acute conditions are known to elevate hsTnI results.  Refer to the "Links" section for chest pain algorithms and additional  guidance. Performed at [facility], [address] [place], [place] [address]   Magnesium     Status: None   Collection Time: 07/13/20  8:20 PM  Result Value Ref Range   Magnesium 1.9 1.7 - 2.4 mg/dL    Comment: Performed at [facility], [address] [place], [place] [address]  Brain natriuretic peptide     Status: Abnormal   Collection Time: 07/13/20  8:24 PM  Result Value Ref Range   B Natriuretic Peptide 1,510.0 (H) 0.0 - 100.0 pg/mL    Comment: Performed at [facility], [address] [place], [place] [address]    Chemistries  Recent Labs   Lab 07/13/20 2020  NA 134*  K 4.1  CL 102  CO2 23  GLUCOSE 115*  BUN 18  CREATININE 1.56*  CALCIUM 8.6*  MG 1.9  AST 44*  ALT 70*  ALKPHOS 63  BILITOT 1.0   ------------------------------------------------------------------------------------------------------------------  ------------------------------------------------------------------------------------------------------------------ GFR: Estimated Creatinine Clearance: 71.5 mL/min (A) (by C-G formula based on SCr of 1.56 mg/dL (H)). Liver Function Tests: Recent Labs  Lab 07/13/20 2020  AST 44*  ALT 70*  ALKPHOS 63  BILITOT 1.0  PROT 6.1*  ALBUMIN 2.9*   No results for input(s): LIPASE, AMYLASE in the last 168 hours. No results for input(s): AMMONIA in the last 168 hours. Coagulation Profile: No results for input(s): INR, PROTIME in the last 168 hours. Cardiac Enzymes: No results for input(s): CKTOTAL, CKMB, CKMBINDEX, TROPONINI in the last 168 hours. BNP (last 3 results) No results for input(s): PROBNP in the last 8760 hours. HbA1C: No results for input(s): HGBA1C in the last 72 hours. CBG: No results for input(s): GLUCAP in the last 168 hours. Lipid Profile: No results for input(s): CHOL, HDL, LDLCALC, TRIG, CHOLHDL, LDLDIRECT in the last 72 hours. Thyroid Function Tests: No results for input(s): TSH, T4TOTAL, FREET4, T3FREE, THYROIDAB in the last  72 hours. Anemia Panel: No results for input(s): VITAMINB12, FOLATE, FERRITIN, TIBC, IRON, RETICCTPCT in the last 72 hours.  --------------------------------------------------------------------------------------------------------------- Urine analysis: No results found for: COLORURINE, APPEARANCEUR, LABSPEC, PHURINE, GLUCOSEU, HGBUR, BILIRUBINUR, KETONESUR, PROTEINUR, UROBILINOGEN, NITRITE, LEUKOCYTESUR    Imaging Results:    DG Chest 2 View  Result Date: 07/13/2020 CLINICAL DATA:  Worsening shortness of breath. Patient was diagnosed with COVID-19 in December  2021. EXAM: CHEST - 2 VIEW COMPARISON:  March 14, 2020 FINDINGS: The cardiac silhouette is enlarged. Mediastinal contours appear intact. Scattered nodular and linear airspace opacities throughout the bilateral lung fields. No evidence of pleural effusion or pneumothorax. Osseous structures are without acute abnormality. Soft tissues are grossly normal. IMPRESSION: 1. Scattered nodular and linear airspace opacities throughout the bilateral lung fields. Findings may represent diffuse airspace disease or early mixed pattern pulmonary edema. 2. Enlarged cardiac silhouette. Electronically Signed   By: Ted Mcalpine M.D.   On: 07/13/2020 20:53    My personal review of EKG: Rhythm sinus tach rate 116/min, QTc 453 ,no Acute ST changes   Assessment & Plan:    Active Problems:   Essential hypertension   AKI (acute kidney injury) (HCC)   CHF exacerbation (HCC)   Elevated troponin   1. New onset CHF 1. BNP 1510, pulmonary edema on chest x-ray, peripheral edema, elevated troponin from demand ischemia, mild transaminitis with AST 44, ALT 70 2. Patient given 40 mg IV Lasix 3. Daily weights, strict intake and output 4. Echo in the a.m. 5. Beta-blocker given in the ED 6. Holding off on starting ACE or ARB in the setting of AKI 7. Continue IV Lasix twice daily 8. Low-sodium diet with fluid restrictions 2. AKI 1. Baseline creatinine 1.26 2. Today 1.56 3. Cardiorenal syndrome 4. Continue Lasix 5. Continue to trend 3. Elevated troponin 1. Likely demand ischemia in the setting of new onset CHF 2. Trending down 3. Continue to monitor 4. Moderate protein calorie malnutrition 1. Extensive conversation regarding nutrient dense food choices that are low in sodium and cholesterol   DVT Prophylaxis-Heparin- SCDs   AM Labs Ordered, also please review Full Orders  Family Communication: No family at bedside Code Status: Full  Admission status: Observation Time spent in minutes :65   Cire Clute B  Zierle-Ghosh DO

## 2020-07-13 NOTE — ED Triage Notes (Signed)
Pt c/o SOB x 2 mos with bilateral leg and feet swelling x 3 days

## 2020-07-14 ENCOUNTER — Encounter (HOSPITAL_COMMUNITY): Payer: Self-pay | Admitting: Family Medicine

## 2020-07-14 ENCOUNTER — Observation Stay (HOSPITAL_COMMUNITY): Payer: 59

## 2020-07-14 DIAGNOSIS — N1831 Chronic kidney disease, stage 3a: Secondary | ICD-10-CM | POA: Diagnosis present

## 2020-07-14 DIAGNOSIS — I10 Essential (primary) hypertension: Secondary | ICD-10-CM | POA: Diagnosis not present

## 2020-07-14 DIAGNOSIS — I130 Hypertensive heart and chronic kidney disease with heart failure and stage 1 through stage 4 chronic kidney disease, or unspecified chronic kidney disease: Secondary | ICD-10-CM | POA: Diagnosis present

## 2020-07-14 DIAGNOSIS — I5023 Acute on chronic systolic (congestive) heart failure: Secondary | ICD-10-CM | POA: Diagnosis present

## 2020-07-14 DIAGNOSIS — I509 Heart failure, unspecified: Secondary | ICD-10-CM

## 2020-07-14 DIAGNOSIS — I429 Cardiomyopathy, unspecified: Secondary | ICD-10-CM | POA: Diagnosis not present

## 2020-07-14 DIAGNOSIS — R778 Other specified abnormalities of plasma proteins: Secondary | ICD-10-CM | POA: Diagnosis not present

## 2020-07-14 DIAGNOSIS — E440 Moderate protein-calorie malnutrition: Secondary | ICD-10-CM | POA: Diagnosis present

## 2020-07-14 DIAGNOSIS — I5082 Biventricular heart failure: Secondary | ICD-10-CM | POA: Diagnosis present

## 2020-07-14 DIAGNOSIS — N189 Chronic kidney disease, unspecified: Secondary | ICD-10-CM | POA: Diagnosis not present

## 2020-07-14 DIAGNOSIS — R7989 Other specified abnormal findings of blood chemistry: Secondary | ICD-10-CM | POA: Diagnosis present

## 2020-07-14 DIAGNOSIS — I5021 Acute systolic (congestive) heart failure: Secondary | ICD-10-CM | POA: Diagnosis not present

## 2020-07-14 DIAGNOSIS — Z8616 Personal history of COVID-19: Secondary | ICD-10-CM | POA: Diagnosis not present

## 2020-07-14 DIAGNOSIS — N179 Acute kidney failure, unspecified: Secondary | ICD-10-CM | POA: Diagnosis not present

## 2020-07-14 DIAGNOSIS — N182 Chronic kidney disease, stage 2 (mild): Secondary | ICD-10-CM | POA: Diagnosis not present

## 2020-07-14 DIAGNOSIS — Z28310 Unvaccinated for covid-19: Secondary | ICD-10-CM | POA: Diagnosis not present

## 2020-07-14 DIAGNOSIS — I428 Other cardiomyopathies: Secondary | ICD-10-CM | POA: Diagnosis present

## 2020-07-14 DIAGNOSIS — Z79899 Other long term (current) drug therapy: Secondary | ICD-10-CM | POA: Diagnosis not present

## 2020-07-14 HISTORY — DX: Heart failure, unspecified: I50.9

## 2020-07-14 LAB — CBC WITH DIFFERENTIAL/PLATELET
Abs Immature Granulocytes: 0.03 10*3/uL (ref 0.00–0.07)
Basophils Absolute: 0.1 10*3/uL (ref 0.0–0.1)
Basophils Relative: 1 %
Eosinophils Absolute: 0.1 10*3/uL (ref 0.0–0.5)
Eosinophils Relative: 1 %
HCT: 41.5 % (ref 39.0–52.0)
Hemoglobin: 13 g/dL (ref 13.0–17.0)
Immature Granulocytes: 0 %
Lymphocytes Relative: 18 %
Lymphs Abs: 1.6 10*3/uL (ref 0.7–4.0)
MCH: 28 pg (ref 26.0–34.0)
MCHC: 31.3 g/dL (ref 30.0–36.0)
MCV: 89.2 fL (ref 80.0–100.0)
Monocytes Absolute: 0.8 10*3/uL (ref 0.1–1.0)
Monocytes Relative: 9 %
Neutro Abs: 6.7 10*3/uL (ref 1.7–7.7)
Neutrophils Relative %: 71 %
Platelets: 223 10*3/uL (ref 150–400)
RBC: 4.65 MIL/uL (ref 4.22–5.81)
RDW: 15.8 % — ABNORMAL HIGH (ref 11.5–15.5)
WBC: 9.3 10*3/uL (ref 4.0–10.5)
nRBC: 0 % (ref 0.0–0.2)

## 2020-07-14 LAB — COMPREHENSIVE METABOLIC PANEL
ALT: 62 U/L — ABNORMAL HIGH (ref 0–44)
AST: 39 U/L (ref 15–41)
Albumin: 2.7 g/dL — ABNORMAL LOW (ref 3.5–5.0)
Alkaline Phosphatase: 56 U/L (ref 38–126)
Anion gap: 9 (ref 5–15)
BUN: 20 mg/dL (ref 6–20)
CO2: 26 mmol/L (ref 22–32)
Calcium: 8.6 mg/dL — ABNORMAL LOW (ref 8.9–10.3)
Chloride: 103 mmol/L (ref 98–111)
Creatinine, Ser: 1.57 mg/dL — ABNORMAL HIGH (ref 0.61–1.24)
GFR, Estimated: 53 mL/min — ABNORMAL LOW (ref >60)
Glucose, Bld: 127 mg/dL — ABNORMAL HIGH (ref 70–99)
Potassium: 4.3 mmol/L (ref 3.5–5.1)
Sodium: 138 mmol/L (ref 135–145)
Total Bilirubin: 1.1 mg/dL (ref 0.3–1.2)
Total Protein: 5.6 g/dL — ABNORMAL LOW (ref 6.5–8.1)

## 2020-07-14 LAB — ECHOCARDIOGRAM COMPLETE
AR max vel: 2.98 cm2
AV Area VTI: 2.57 cm2
AV Area mean vel: 2.62 cm2
AV Mean grad: 2.5 mmHg
AV Peak grad: 4.1 mmHg
Ao pk vel: 1.02 m/s
Area-P 1/2: 3.97 cm2
Height: 74 in
S' Lateral: 5.15 cm
Weight: 3992.97 oz

## 2020-07-14 LAB — MAGNESIUM: Magnesium: 2 mg/dL (ref 1.7–2.4)

## 2020-07-14 LAB — TSH: TSH: 2.191 u[IU]/mL (ref 0.350–4.500)

## 2020-07-14 LAB — TROPONIN I (HIGH SENSITIVITY): Troponin I (High Sensitivity): 60 ng/L — ABNORMAL HIGH (ref <18)

## 2020-07-14 MED ORDER — METOPROLOL TARTRATE 50 MG PO TABS
50.0000 mg | ORAL_TABLET | Freq: Two times a day (BID) | ORAL | Status: DC
  Administered 2020-07-14 – 2020-07-16 (×7): 50 mg via ORAL
  Filled 2020-07-14 (×8): qty 1

## 2020-07-14 MED ORDER — AMLODIPINE BESYLATE 5 MG PO TABS
5.0000 mg | ORAL_TABLET | Freq: Every day | ORAL | Status: DC
  Administered 2020-07-14 – 2020-07-16 (×3): 5 mg via ORAL
  Filled 2020-07-14 (×3): qty 1

## 2020-07-14 MED ORDER — HEPARIN SODIUM (PORCINE) 5000 UNIT/ML IJ SOLN
5000.0000 [IU] | Freq: Three times a day (TID) | INTRAMUSCULAR | Status: DC
  Administered 2020-07-14 – 2020-07-19 (×16): 5000 [IU] via SUBCUTANEOUS
  Filled 2020-07-14 (×16): qty 1

## 2020-07-14 MED ORDER — ACETAMINOPHEN 325 MG PO TABS: 650.0000 mg | ORAL_TABLET | Freq: Four times a day (QID) | ORAL | Status: DC | PRN

## 2020-07-14 MED ORDER — ACETAMINOPHEN 650 MG RE SUPP: 650.0000 mg | Freq: Four times a day (QID) | RECTAL | Status: DC | PRN

## 2020-07-14 MED ORDER — ONDANSETRON HCL 4 MG/2ML IJ SOLN: 4.0000 mg | Freq: Four times a day (QID) | INTRAMUSCULAR | Status: DC | PRN

## 2020-07-14 MED ORDER — OXYCODONE HCL 5 MG PO TABS: 5.0000 mg | ORAL_TABLET | ORAL | Status: DC | PRN

## 2020-07-14 MED ORDER — FUROSEMIDE 10 MG/ML IJ SOLN
40.0000 mg | Freq: Two times a day (BID) | INTRAMUSCULAR | Status: DC
  Administered 2020-07-14 – 2020-07-17 (×7): 40 mg via INTRAVENOUS
  Filled 2020-07-14 (×7): qty 4

## 2020-07-14 MED ORDER — METOPROLOL SUCCINATE ER 50 MG PO TB24: 50.0000 mg | ORAL_TABLET | Freq: Every day | ORAL | Status: DC

## 2020-07-14 MED ORDER — ONDANSETRON HCL 4 MG PO TABS: 4.0000 mg | ORAL_TABLET | Freq: Four times a day (QID) | ORAL | Status: DC | PRN

## 2020-07-14 MED ORDER — POLYETHYLENE GLYCOL 3350 17 G PO PACK: 17.0000 g | PACK | Freq: Every day | ORAL | Status: DC | PRN

## 2020-07-14 MED ORDER — ALBUTEROL SULFATE HFA 108 (90 BASE) MCG/ACT IN AERS
2.0000 | INHALATION_SPRAY | RESPIRATORY_TRACT | Status: DC | PRN
  Filled 2020-07-14: qty 6.7

## 2020-07-14 NOTE — Progress Notes (Signed)
   07/14/20 0850  Vitals  Temp 98 F (36.7 C)  Temp Source Oral  BP 131/89  MAP (mmHg) 102  BP Method Automatic  Pulse Rate 85  Pulse Rate Source Monitor  MEWS COLOR  MEWS Score Color Green  Oxygen Therapy  SpO2 98 %  MEWS Score  MEWS Temp 0  MEWS Systolic 0  MEWS Pulse 0  MEWS RR 1  MEWS LOC 0  MEWS Score 1

## 2020-07-14 NOTE — Progress Notes (Signed)
PROGRESS NOTE   Tony Meyer  EVO:[id number] DOB: 1970-08-02 DOA: 07/13/2020 PCP: Patient, No Pcp Per (Inactive)   Chief Complaint  Patient presents with  . Shortness of Breath   Level of care: Telemetry  Brief Admission History:  50 y.o. male, with history of admission for Covid in December, hypertension, presents to the ED with a chief complaint of shortness of breath.  Patient reports that he has been short of breath since his discharge in December 2021.  He reports he stopped taking his medications because he thought that they were making him short of breath.  Early last 3 weeks he has had an acute change with worsening shortness of breath, and tightness in his abdomen.  Over the last week he has had increased peripheral edema.  Patient reports that he has had an increase in fatigue with exertion.  He denies any chest pain, palpitations, nausea, vomiting.  He does report a decrease in urinary output over the past few days.  He attributes this to the fact that he has been drinking less water because he has been trying to prevent the swelling.  Patient reports no dysuria or hematuria.  Patient has no history of CHF.  He does not follow any diet, and reports that he definitely does not follow a low-sodium diet.  Patient does not check his blood pressure at home, in the setting of not taking his medications his blood pressure on admission was 135/101.  Patient has no other complaints at this time. Patient does not smoke, does not drink, does not use illicit drugs.  Patient is not vaccinated for COVID.  Patient is full code.    Assessment & Plan:   Active Problems:   Essential hypertension   AKI (acute kidney injury) (HCC)   CHF exacerbation (HCC)   Elevated troponin  1. Heart Failure - New Onset - He is diuresing well on IV lasix.  Follow up 2D echocardiogram.  Follow electrolytes.   2. Suspected stage 2 CKD - likely from poorly controlled hypertension.  Follow creatinine with diuresis.   3. Elevated troponin - likely from demand ischemia associated with heart failure, do not suspect ACS.  Follow 2D echo for wall motion abnormalities.     DVT prophylaxis: SCDs Code Status: Full  Family Communication:  Disposition:  Status is: Observation  The patient remains OBS appropriate and will d/c before 2 midnights.  Dispo: The patient is from: Home              Anticipated d/c is to: Home              Patient currently is not medically stable to d/c.   Difficult to place patient No   Consultants:   n/a  Procedures:   2D echo 07/14/20  Antimicrobials:  n/a   Subjective: Pt says he is urinating frequently and seems to feel better, leg edema slightly improved  Objective: Vitals:   07/14/20 0208 07/14/20 0444 07/14/20 0459 07/14/20 0850  BP:  (!) 150/112  131/89  Pulse:  (!) 104  85  Resp:  (!) 21    Temp:  (!) 97.1 F (36.2 C)  98 F (36.7 C)  TempSrc:    Oral  SpO2: 100% 100%  98%  Weight:   113.2 kg   Height:        Intake/Output Summary (Last 24 hours) at 07/14/2020 1329 Last data filed at 07/14/2020 0900 Gross per 24 hour  Intake 480 ml  Output 3150 ml  Net -2670 ml   Filed Weights   07/13/20 1737 07/14/20 0459  Weight: 102.1 kg 113.2 kg    Examination:  General exam: Awake, alert, cooperative, Appears calm and comfortable  Respiratory system: crackles at bases.  Clear to auscultation. Respiratory effort normal. Cardiovascular system: normal S1 & S2 heard. No JVD, murmurs, rubs, gallops or clicks. No pedal edema. Gastrointestinal system: Abdomen is nondistended, soft and nontender. No organomegaly or masses felt. Normal bowel sounds heard. Central nervous system: Alert and oriented. No focal neurological deficits. Extremities: pitting edema BLEs Symmetric 5 x 5 power. Skin: No rashes, lesions or ulcers Psychiatry: Judgement and insight appear normal. Mood & affect appropriate.   Data Reviewed: I have personally reviewed following labs and  imaging studies  CBC: Recent Labs  Lab 07/13/20 2020 07/14/20 0443  WBC 8.3 9.3  NEUTROABS 5.6 6.7  HGB 13.2 13.0  HCT 42.5 41.5  MCV 88.5 89.2  PLT 227 223    Basic Metabolic Panel: Recent Labs  Lab 07/13/20 2020 07/14/20 0443  NA 134* 138  K 4.1 4.3  CL 102 103  CO2 23 26  GLUCOSE 115* 127*  BUN 18 20  CREATININE 1.56* 1.57*  CALCIUM 8.6* 8.6*  MG 1.9 2.0    GFR: Estimated Creatinine Clearance: 74.5 mL/min (A) (by C-G formula based on SCr of 1.57 mg/dL (H)).  Liver Function Tests: Recent Labs  Lab 07/13/20 2020 07/14/20 0443  AST 44* 39  ALT 70* 62*  ALKPHOS 63 56  BILITOT 1.0 1.1  PROT 6.1* 5.6*  ALBUMIN 2.9* 2.7*    CBG: No results for input(s): GLUCAP in the last 168 hours.  Recent Results (from the past 240 hour(s))  Resp Panel by RT-PCR (Flu A&B, Covid) Nasopharyngeal Swab     Status: None   Collection Time: 07/13/20  8:03 PM   Specimen: Nasopharyngeal Swab; Nasopharyngeal(NP) swabs in vial transport medium  Result Value Ref Range Status   SARS Coronavirus 2 by RT PCR NEGATIVE NEGATIVE Final    Comment: (NOTE) SARS-CoV-2 target nucleic acids are NOT DETECTED.  The SARS-CoV-2 RNA is generally detectable in upper respiratory specimens during the acute phase of infection. The lowest concentration of SARS-CoV-2 viral copies this assay can detect is 138 copies/mL. A negative result does not preclude SARS-Cov-2 infection and should not be used as the sole basis for treatment or other patient management decisions. A negative result may occur with  improper specimen collection/handling, submission of specimen other than nasopharyngeal swab, presence of viral mutation(s) within the areas targeted by this assay, and inadequate number of viral copies(<138 copies/mL). A negative result must be combined with clinical observations, patient history, and epidemiological information. The expected result is Negative.  Fact Sheet for Patients:   BloggerCourse.com  Fact Sheet for Healthcare Providers:  SeriousBroker.it  This test is no t yet approved or cleared by the [place] FDA and  has been authorized for detection and/or diagnosis of SARS-CoV-2 by FDA under an Emergency Use Authorization (EUA). This EUA will remain  in effect (meaning this test can be used) for the duration of the COVID-19 declaration under Section 564(b)(1) of the Act, 21 U.S.C.section 360bbb-3(b)(1), unless the authorization is terminated  or revoked sooner.       Influenza A by PCR NEGATIVE NEGATIVE Final   Influenza B by PCR NEGATIVE NEGATIVE Final    Comment: (NOTE) The Xpert Xpress SARS-CoV-2/FLU/RSV plus assay is intended as an aid in the diagnosis of influenza from Nasopharyngeal swab specimens and  should not be used as a sole basis for treatment. Nasal washings and aspirates are unacceptable for Xpert Xpress SARS-CoV-2/FLU/RSV testing.  Fact Sheet for Patients: BloggerCourse.com  Fact Sheet for Healthcare Providers: SeriousBroker.it  This test is not yet approved or cleared by the [place] FDA and has been authorized for detection and/or diagnosis of SARS-CoV-2 by FDA under an Emergency Use Authorization (EUA). This EUA will remain in effect (meaning this test can be used) for the duration of the COVID-19 declaration under Section 564(b)(1) of the Act, 21 U.S.C. section 360bbb-3(b)(1), unless the authorization is terminated or revoked.  Performed at [facility], [address], [place], [place] [address]      Radiology Studies: DG Chest 2 View  Result Date: 07/13/2020 CLINICAL DATA:  Worsening shortness of breath. Patient was diagnosed with COVID-19 in December 2021. EXAM: CHEST - 2 VIEW COMPARISON:  March 14, 2020 FINDINGS: The cardiac silhouette is enlarged. Mediastinal contours appear intact. Scattered nodular and  linear airspace opacities throughout the bilateral lung fields. No evidence of pleural effusion or pneumothorax. Osseous structures are without acute abnormality. Soft tissues are grossly normal. IMPRESSION: 1. Scattered nodular and linear airspace opacities throughout the bilateral lung fields. Findings may represent diffuse airspace disease or early mixed pattern pulmonary edema. 2. Enlarged cardiac silhouette. Electronically Signed   By: Ted Mcalpine M.D.   On: 07/13/2020 20:53   Scheduled Meds: . amLODipine  5 mg Oral Daily  . furosemide  40 mg Intravenous Q12H  . heparin  5,000 Units Subcutaneous Q8H  . metoprolol tartrate  50 mg Oral BID   Continuous Infusions:   LOS: 0 days   Time spent: 38 mins   Kasmira Cacioppo Laural Benes, MD How to contact the [facility] Attending or Consulting provider 7A - 7P or covering provider during after hours 7P -7A, for this patient?  1. Check the care team in [facility] and look for a) attending/consulting [facility] provider listed and b) the [facility] team listed 2. Log into [email or web address] and use [employer]'s universal password to access. If you do not have the password, please contact the hospital operator. 3. Locate the [facility] provider you are looking for under [facility] and page to a number that you can be directly reached. 4. If you still have difficulty reaching the provider, please page the [facility] (Director on Call) for the Hospitalists listed on amion for assistance.  07/14/2020, 1:29 PM

## 2020-07-14 NOTE — Progress Notes (Signed)
  Echocardiogram 2D Echocardiogram has been performed.  Tony Meyer 07/14/2020, 12:15 PM

## 2020-07-14 NOTE — Progress Notes (Signed)
HF booklet not available on the unit.

## 2020-07-14 NOTE — Plan of Care (Signed)

## 2020-07-15 DIAGNOSIS — I10 Essential (primary) hypertension: Secondary | ICD-10-CM | POA: Diagnosis not present

## 2020-07-15 DIAGNOSIS — R778 Other specified abnormalities of plasma proteins: Secondary | ICD-10-CM | POA: Diagnosis not present

## 2020-07-15 DIAGNOSIS — N179 Acute kidney failure, unspecified: Secondary | ICD-10-CM | POA: Diagnosis not present

## 2020-07-15 DIAGNOSIS — I509 Heart failure, unspecified: Secondary | ICD-10-CM | POA: Diagnosis not present

## 2020-07-15 LAB — BASIC METABOLIC PANEL
Anion gap: 12 (ref 5–15)
BUN: 29 mg/dL — ABNORMAL HIGH (ref 6–20)
CO2: 23 mmol/L (ref 22–32)
Calcium: 8.7 mg/dL — ABNORMAL LOW (ref 8.9–10.3)
Chloride: 104 mmol/L (ref 98–111)
Creatinine, Ser: 1.6 mg/dL — ABNORMAL HIGH (ref 0.61–1.24)
GFR, Estimated: 52 mL/min — ABNORMAL LOW (ref >60)
Glucose, Bld: 111 mg/dL — ABNORMAL HIGH (ref 70–99)
Potassium: 3.8 mmol/L (ref 3.5–5.1)
Sodium: 139 mmol/L (ref 135–145)

## 2020-07-15 LAB — MAGNESIUM: Magnesium: 1.9 mg/dL (ref 1.7–2.4)

## 2020-07-15 LAB — BRAIN NATRIURETIC PEPTIDE: B Natriuretic Peptide: 1419 pg/mL — ABNORMAL HIGH (ref 0.0–100.0)

## 2020-07-15 MED ORDER — POTASSIUM CHLORIDE CRYS ER 10 MEQ PO TBCR
10.0000 meq | EXTENDED_RELEASE_TABLET | Freq: Every day | ORAL | Status: DC
  Administered 2020-07-15 – 2020-07-20 (×6): 10 meq via ORAL
  Filled 2020-07-15 (×6): qty 1

## 2020-07-15 NOTE — Progress Notes (Signed)
PROGRESS NOTE   Tony Meyer  AVW:[id number] DOB: 11/12/1970 DOA: 07/13/2020 PCP: Patient, No Pcp Per (Inactive)   Chief Complaint  Patient presents with  . Shortness of Breath   Level of care: Telemetry  Brief Admission History:  50 y.o. male, with history of admission for Covid in December, hypertension, presents to the ED with a chief complaint of shortness of breath.  Patient reports that he has been short of breath since his discharge in December 2021.  He reports he stopped taking his medications because he thought that they were making him short of breath.  Early last 3 weeks he has had an acute change with worsening shortness of breath, and tightness in his abdomen.  Over the last week he has had increased peripheral edema.  Patient reports that he has had an increase in fatigue with exertion.  He denies any chest pain, palpitations, nausea, vomiting.  He does report a decrease in urinary output over the past few days.  He attributes this to the fact that he has been drinking less water because he has been trying to prevent the swelling.  Patient reports no dysuria or hematuria.  Patient has no history of CHF.  He does not follow any diet, and reports that he definitely does not follow a low-sodium diet.  Patient does not check his blood pressure at home, in the setting of not taking his medications his blood pressure on admission was 135/101.  Patient has no other complaints at this time. Patient does not smoke, does not drink, does not use illicit drugs.  Patient is not vaccinated for COVID.  Patient is full code.    Assessment & Plan:   Active Problems:   Essential hypertension   AKI (acute kidney injury) (HCC)   CHF exacerbation (HCC)   Elevated troponin   Acute heart failure (HCC)  1. 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  He is  diuresing well on IV lasix.  2D echocardiogram reports EF 20-25% severe LV dysfunction and also has severe global hypokinesis and some right heart failure.  Will consult inpatient cardiology service to see on 4/4. TSH WNL.    2. Suspected stage 2 CKD - likely from poorly controlled hypertension.  Follow creatinine with diuresis. Check renal US.  3. Elevated troponin - likely from demand ischemia associated with acute systolic heart failure, do not suspect ACS.    DVT prophylaxis: SCDs Code Status: Full  Family Communication: counseled patient at bedside, verbalized understanding Disposition:  Status is: Observation  The patient will require care spanning > 2 midnights and should be moved to inpatient because: Ongoing diagnostic testing needed not appropriate for outpatient work up, IV treatments appropriate due to intensity of illness or inability to take PO and Inpatient level of care appropriate due to severity of illness  Dispo: The patient is from: Home              Anticipated d/c is to: Home              Patient currently is not medically stable to d/c.   Difficult to place patient No  Consultants:   n/a  Procedures:   2D echo 07/14/20  Antimicrobials:  n/a   Subjective: Pt reports that he is still SOB with activities, still has edema in legs, no chest pain  Objective: Vitals:  07/14/20 1450 07/14/20 2018 07/15/20 0434 07/15/20 0936  BP: 117/77 (!) 128/103 (!) 125/91 120/88  Pulse: 92 (!) 102 93 (!) 101  Resp:  20 20   Temp: 97.6 F (36.4 C) (!) 97.1 F (36.2 C) (!) 97.4 F (36.3 C)   TempSrc: Oral     SpO2: 100% 100% 99%   Weight:      Height:        Intake/Output Summary (Last 24 hours) at 07/15/2020 1203 Last data filed at 07/14/2020 2045 Gross per 24 hour  Intake 720 ml  Output 300 ml  Net 420 ml   Filed Weights   07/13/20 1737 07/14/20 0459  Weight: 102.1 kg 113.2 kg    Examination:  General exam: Awake, alert, cooperative, Appears calm and  comfortable  Respiratory system: crackles at bases.  Clear to auscultation. Respiratory effort normal. Cardiovascular system: normal S1 & S2 heard. No JVD, murmurs, rubs, gallops or clicks. No pedal edema. Gastrointestinal system: Abdomen is nondistended, soft and nontender. No organomegaly or masses felt. Normal bowel sounds heard. Central nervous system: Alert and oriented. No focal neurological deficits. Extremities: 2+ pitting edema BLEs Symmetric 5 x 5 power. Skin: No rashes, lesions or ulcers Psychiatry: Judgement and insight appear normal. Mood & affect appropriate.   Data Reviewed: I have personally reviewed following labs and imaging studies  CBC: Recent Labs  Lab 07/13/20 2020 07/14/20 0443  WBC 8.3 9.3  NEUTROABS 5.6 6.7  HGB 13.2 13.0  HCT 42.5 41.5  MCV 88.5 89.2  PLT 227 223    Basic Metabolic Panel: Recent Labs  Lab 07/13/20 2020 07/14/20 0443 07/15/20 0549  NA 134* 138 139  K 4.1 4.3 3.8  CL 102 103 104  CO2 23 26 23   GLUCOSE 115* 127* 111*  BUN 18 20 29*  CREATININE 1.56* 1.57* 1.60*  CALCIUM 8.6* 8.6* 8.7*  MG 1.9 2.0 1.9    GFR: Estimated Creatinine Clearance: 73.1 mL/min (A) (by C-G formula based on SCr of 1.6 mg/dL (H)).  Liver Function Tests: Recent Labs  Lab 07/13/20 2020 07/14/20 0443  AST 44* 39  ALT 70* 62*  ALKPHOS 63 56  BILITOT 1.0 1.1  PROT 6.1* 5.6*  ALBUMIN 2.9* 2.7*    CBG: No results for input(s): GLUCAP in the last 168 hours.  Recent Results (from the past 240 hour(s))  Resp Panel by RT-PCR (Flu A&B, Covid) Nasopharyngeal Swab     Status: None   Collection Time: 07/13/20  8:03 PM   Specimen: Nasopharyngeal Swab; Nasopharyngeal(NP) swabs in vial transport medium  Result Value Ref Range Status   SARS Coronavirus 2 by RT PCR NEGATIVE NEGATIVE Final    Comment: (NOTE) SARS-CoV-2 target nucleic acids are NOT DETECTED.  The SARS-CoV-2 RNA is generally detectable in upper respiratory specimens during the acute phase of  infection. The lowest concentration of SARS-CoV-2 viral copies this assay can detect is 138 copies/mL. A negative result does not preclude SARS-Cov-2 infection and should not be used as the sole basis for treatment or other patient management decisions. A negative result may occur with  improper specimen collection/handling, submission of specimen other than nasopharyngeal swab, presence of viral mutation(s) within the areas targeted by this assay, and inadequate number of viral copies(<138 copies/mL). A negative result must be combined with clinical observations, patient history, and epidemiological information. The expected result is Negative.  Fact Sheet for Patients:  07/15/20  Fact Sheet for Healthcare Providers:  BloggerCourse.com  This test is no t yet  approved or cleared by the [place] and  has been authorized for detection and/or diagnosis of SARS-CoV-2 by FDA under an Emergency Use Authorization (EUA). This EUA will remain  in effect (meaning this test can be used) for the duration of the COVID-19 declaration under Section 564(b)(1) of the Act, 21 U.S.C.section 360bbb-3(b)(1), unless the authorization is terminated  or revoked sooner.       Influenza A by PCR NEGATIVE NEGATIVE Final   Influenza B by PCR NEGATIVE NEGATIVE Final    Comment: (NOTE) The Xpert Xpress SARS-CoV-2/FLU/RSV plus assay is intended as an aid in the diagnosis of influenza from Nasopharyngeal swab specimens and should not be used as a sole basis for treatment. Nasal washings and aspirates are unacceptable for Xpert Xpress SARS-CoV-2/FLU/RSV testing.  Fact Sheet for Patients: BloggerCourse.com  Fact Sheet for Healthcare Providers: SeriousBroker.it  This test is not yet approved or cleared by the [place] FDA and has been authorized for detection and/or diagnosis of SARS-CoV-2  by FDA under an Emergency Use Authorization (EUA). This EUA will remain in effect (meaning this test can be used) for the duration of the COVID-19 declaration under Section 564(b)(1) of the Act, 21 U.S.C. section 360bbb-3(b)(1), unless the authorization is terminated or revoked.  Performed at [facility], [address] [place], [place] [address]      Radiology Studies: DG Chest 2 View  Result Date: 07/13/2020 CLINICAL DATA:  Worsening shortness of breath. Patient was diagnosed with COVID-19 in December 2021. EXAM: CHEST - 2 VIEW COMPARISON:  March 14, 2020 FINDINGS: The cardiac silhouette is enlarged. Mediastinal contours appear intact. Scattered nodular and linear airspace opacities throughout the bilateral lung fields. No evidence of pleural effusion or pneumothorax. Osseous structures are without acute abnormality. Soft tissues are grossly normal. IMPRESSION: 1. Scattered nodular and linear airspace opacities throughout the bilateral lung fields. Findings may represent diffuse airspace disease or early mixed pattern pulmonary edema. 2. Enlarged cardiac silhouette. Electronically Signed   By: Ted Mcalpine M.D.   On: 07/13/2020 20:53   ECHOCARDIOGRAM COMPLETE  Result Date: 07/14/2020    ECHOCARDIOGRAM REPORT   Patient Name:   MARQUARIUS LOFTON Date of Exam: 07/14/2020 Medical Rec #:  [id number]        Height:       74.0 in Accession #:    [id number]       Weight:       249.6 lb Date of Birth:  [birth year]         BSA:          2.388 m Patient Age:    49 years         BP:           131/89 mmHg Patient Gender: M                HR:           85 bpm. [facility] Procedure: 2D Echo, Cardiac Doppler and Color Doppler Indications:    CHF  History:        Patient has prior history of Echocardiogram examinations. CHF,                 Signs/Symptoms:Shortness of Breath and LE edema, AKI; Risk                 Factors:Hypertension. COVID, elevated troponin.  Sonographer:    Lavenia Atlas RDCS Referring Phys: [id number] ASIA B ZIERLE-GHOSH IMPRESSIONS  1. Left  ventricular ejection fraction, by estimation, is 20 to 25%. The left ventricle has severely decreased function. The left ventricle demonstrates global hypokinesis. The left ventricular internal cavity size was mildly dilated. There is mild left ventricular hypertrophy. Left ventricular diastolic parameters are indeterminate.  2. Right ventricular systolic function is mildly reduced. The right ventricular size is moderately enlarged. There is normal pulmonary artery systolic pressure.  3. Left atrial size was mildly dilated.  4. Right atrial size was mildly dilated.  5. The mitral valve is abnormal. Mild mitral valve regurgitation. No evidence of mitral stenosis.  6. The aortic valve is tricuspid. There is mild calcification of the aortic valve. There is mild thickening of the aortic valve. Aortic valve regurgitation is not visualized. No aortic stenosis is present.  7. The inferior vena cava is dilated in size with <50% respiratory variability, suggesting right atrial pressure of 15 mmHg. FINDINGS  Left Ventricle: Left ventricular ejection fraction, by estimation, is 20 to 25%. The left ventricle has severely decreased function. The left ventricle demonstrates global hypokinesis. The left ventricular internal cavity size was mildly dilated. There is mild left ventricular hypertrophy. Left ventricular diastolic parameters are indeterminate. Right Ventricle: The right ventricular size is moderately enlarged. Right vetricular wall thickness was not assessed. Right ventricular systolic function is mildly reduced. There is normal pulmonary artery systolic pressure. The tricuspid regurgitant velocity is 2.26 m/s, and with an assumed right atrial pressure of 3 mmHg, the estimated right ventricular systolic pressure is 23.4 mmHg. Left Atrium: Left atrial size was mildly dilated. Right Atrium: Right atrial size was mildly dilated. Pericardium:  There is no evidence of pericardial effusion. Mitral Valve: The mitral valve is abnormal. There is mild thickening of the mitral valve leaflet(s). There is mild calcification of the mitral valve leaflet(s). Mild mitral annular calcification. Mild mitral valve regurgitation. No evidence of mitral valve stenosis. Tricuspid Valve: The tricuspid valve is normal in structure. Tricuspid valve regurgitation is mild . No evidence of tricuspid stenosis. Aortic Valve: The aortic valve is tricuspid. There is mild calcification of the aortic valve. There is mild thickening of the aortic valve. There is mild aortic valve annular calcification. Aortic valve regurgitation is not visualized. No aortic stenosis  is present. Aortic valve mean gradient measures 2.5 mmHg. Aortic valve peak gradient measures 4.1 mmHg. Aortic valve area, by VTI measures 2.57 cm. Pulmonic Valve: The pulmonic valve was not well visualized. Pulmonic valve regurgitation is trivial. No evidence of pulmonic stenosis. Aorta: The aortic root is normal in size and structure. Pulmonary Artery: Mild pulmonary HTN, PASP is 35 mmHg. Venous: The inferior vena cava is dilated in size with less than 50% respiratory variability, suggesting right atrial pressure of 15 mmHg. IAS/Shunts: No atrial level shunt detected by color flow Doppler.  LEFT VENTRICLE PLAX 2D LVIDd:         5.93 cm  Diastology LVIDs:         5.15 cm  LV e' medial:    4.68 cm/s LV PW:         1.21 cm  LV E/e' medial:  15.0 LV IVS:        1.18 cm  LV e' lateral:   6.64 cm/s LVOT diam:     2.10 cm  LV E/e' lateral: 10.5 LV SV:         48 LV SV Index:   20 LVOT Area:     3.46 cm  RIGHT VENTRICLE RV Basal diam:  4.14 cm RV S prime:  7.62 cm/s TAPSE (M-mode): 1.7 cm LEFT ATRIUM             Index       RIGHT ATRIUM           Index LA diam:        4.50 cm 1.88 cm/m  RA Area:     24.10 cm LA Vol (A2C):   52.1 ml 21.82 ml/m RA Volume:   78.10 ml  32.71 ml/m LA Vol (A4C):   56.7 ml 23.74 ml/m LA Biplane  Vol: 59.5 ml 24.92 ml/m  AORTIC VALVE AV Area (Vmax):    2.98 cm AV Area (Vmean):   2.62 cm AV Area (VTI):     2.57 cm AV Vmax:           101.64 cm/s AV Vmean:          76.542 cm/s AV VTI:            0.186 m AV Peak Grad:      4.1 mmHg AV Mean Grad:      2.5 mmHg LVOT Vmax:         87.50 cm/s LVOT Vmean:        57.900 cm/s LVOT VTI:          0.138 m LVOT/AV VTI ratio: 0.74  AORTA Ao Root diam: 3.60 cm MITRAL VALVE               TRICUSPID VALVE MV Area (PHT): 3.97 cm    TR Peak grad:   20.4 mmHg MV Decel Time: 191 msec    TR Vmax:        226.00 cm/s MV E velocity: 70.00 cm/s MV A velocity: 39.40 cm/s  SHUNTS MV E/A ratio:  1.78        Systemic VTI:  0.14 m                            Systemic Diam: 2.10 cm Dina Rich MD Electronically signed by Dina Rich MD Signature Date/Time: 07/14/2020/4:20:21 PM    Final    Scheduled Meds: . amLODipine  5 mg Oral Daily  . furosemide  40 mg Intravenous Q12H  . heparin  5,000 Units Subcutaneous Q8H  . metoprolol tartrate  50 mg Oral BID  . potassium chloride  10 mEq Oral Daily   Continuous Infusions:   LOS: 1 day   Time spent: 36 mins   Jevonte Clanton Laural Benes, MD How to contact the [facility] Attending or Consulting provider 7A - 7P or covering provider during after hours 7P -7A, for this patient?  1. Check the care team in [facility] and look for a) attending/consulting [facility] provider listed and b) the [facility] team listed 2. Log into [email or web address] and use [facility]'s universal password to access. If you do not have the password, please contact the hospital operator. 3. Locate the [facility] provider you are looking for under [facility] and page to a number that you can be directly reached. 4. If you still have difficulty reaching the provider, please page the [facility] (Director on Call) for the Hospitalists listed on amion for assistance.  07/15/2020, 12:03 PM

## 2020-07-16 ENCOUNTER — Inpatient Hospital Stay (HOSPITAL_COMMUNITY): Payer: 59

## 2020-07-16 DIAGNOSIS — I509 Heart failure, unspecified: Secondary | ICD-10-CM | POA: Diagnosis not present

## 2020-07-16 DIAGNOSIS — I10 Essential (primary) hypertension: Secondary | ICD-10-CM | POA: Diagnosis not present

## 2020-07-16 DIAGNOSIS — R778 Other specified abnormalities of plasma proteins: Secondary | ICD-10-CM | POA: Diagnosis not present

## 2020-07-16 DIAGNOSIS — N179 Acute kidney failure, unspecified: Secondary | ICD-10-CM | POA: Diagnosis not present

## 2020-07-16 LAB — BASIC METABOLIC PANEL
Anion gap: 10 (ref 5–15)
BUN: 31 mg/dL — ABNORMAL HIGH (ref 6–20)
CO2: 29 mmol/L (ref 22–32)
Calcium: 8.7 mg/dL — ABNORMAL LOW (ref 8.9–10.3)
Chloride: 105 mmol/L (ref 98–111)
Creatinine, Ser: 1.61 mg/dL — ABNORMAL HIGH (ref 0.61–1.24)
GFR, Estimated: 51 mL/min — ABNORMAL LOW (ref >60)
Glucose, Bld: 99 mg/dL (ref 70–99)
Potassium: 3.8 mmol/L (ref 3.5–5.1)
Sodium: 144 mmol/L (ref 135–145)

## 2020-07-16 LAB — MAGNESIUM: Magnesium: 1.9 mg/dL (ref 1.7–2.4)

## 2020-07-16 LAB — BRAIN NATRIURETIC PEPTIDE: B Natriuretic Peptide: 1244 pg/mL — ABNORMAL HIGH (ref 0.0–100.0)

## 2020-07-16 NOTE — Progress Notes (Signed)
PROGRESS NOTE   Tony FischerMichael T Meyer  ZOX:[id number] DOB: 05/11/1970 DOA: 07/13/2020 PCP: Patient, No Pcp Per (Inactive)   Chief Complaint  Patient presents with  . Shortness of Breath   Level of care: Telemetry  Brief Admission History:  50 y.o. male, with history of admission for Covid in December, hypertension, presents to the ED with a chief complaint of shortness of breath.  Patient reports that he has been short of breath since his discharge in December 2021.  He reports he stopped taking his medications because he thought that they were making him short of breath.  Early last 3 weeks he has had an acute change with worsening shortness of breath, and tightness in his abdomen.  Over the last week he has had increased peripheral edema.  Patient reports that he has had an increase in fatigue with exertion.  He denies any chest pain, palpitations, nausea, vomiting.  He does report a decrease in urinary output over the past few days.  He attributes this to the fact that he has been drinking less water because he has been trying to prevent the swelling.  Patient reports no dysuria or hematuria.  Patient has no history of CHF.  He does not follow any diet, and reports that he definitely does not follow a low-sodium diet.  Patient does not check his blood pressure at home, in the setting of not taking his medications his blood pressure on admission was 135/101.  Patient has no other complaints at this time.  Patient does not smoke, does not drink, does not use illicit drugs.  Patient is not vaccinated for COVID.  Patient is full code.    Assessment & Plan:   Active Problems:   Essential hypertension   AKI (acute kidney injury) (HCC)   CHF exacerbation (HCC)   Elevated troponin   Acute heart failure (HCC)  1. 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  He is  diuresing well on IV lasix.  2D echocardiogram reports EF 20-25% severe LV dysfunction and also has severe global hypokinesis and some right heart failure.  Will consult inpatient cardiology service to see on 4/4. TSH WNL.    2. Suspected stage 2 CKD - likely from poorly controlled hypertension.  Follow creatinine with diuresis. Check renal US.  3. Elevated troponin - likely from demand ischemia associated with acute systolic heart failure, do not suspect ACS.    DVT prophylaxis: SCDs Code Status: Full  Family Communication: counseled patient at bedside, verbalized understanding Disposition:  Status is: Inpatient   The patient will require care spanning > 2 midnights and should be moved to inpatient because: Ongoing diagnostic testing needed not appropriate for outpatient work up, IV treatments appropriate due to intensity of illness or inability to take PO and Inpatient level of care appropriate due to severity of illness  Dispo: The patient is from: Home              Anticipated d/c is to: Home              Patient currently is not medically stable to d/c.   Difficult to place patient No  Consultants:   n/a  Procedures:   2D echo 07/14/20  Antimicrobials:  n/a   Subjective: Pt reports that he continues to have vigorous diuresis on the IV lasix and leg edema is getting  better.    Objective: Vitals:   07/15/20 1422 07/15/20 2101 07/16/20 0321 07/16/20 0600  BP: (!) 126/94 (!) 115/91 109/74   Pulse: 94 (!) 101 94   Resp: 20 20 20    Temp: 98.6 F (37 C) (!) 97.4 F (36.3 C) 97.6 F (36.4 C)   TempSrc:      SpO2: 100% 98% 98%   Weight:    113.1 kg  Height:        Intake/Output Summary (Last 24 hours) at 07/16/2020 1130 Last data filed at 07/16/2020 0900 Gross per 24 hour  Intake 120 ml  Output 400 ml  Net -280 ml   Filed Weights   07/13/20 1737 07/14/20 0459 07/16/20 0600  Weight: 102.1 kg 113.2 kg 113.1 kg    Examination:  General exam: Awake, alert, cooperative,  Appears calm and comfortable  Respiratory system: crackles at bases.  Clear to auscultation. Respiratory effort normal. Cardiovascular system: normal S1 & S2 heard. No JVD, murmurs, rubs, gallops or clicks. No pedal edema. Gastrointestinal system: Abdomen is nondistended, soft and nontender. No organomegaly or masses felt. Normal bowel sounds heard. Central nervous system: Alert and oriented. No focal neurological deficits. Extremities: 1+ pitting edema BLEs Symmetric 5 x 5 power.  Ted Hoses on.   Skin: No rashes, lesions or ulcers Psychiatry: Judgement and insight appear normal. Mood & affect appropriate.   Data Reviewed: I have personally reviewed following labs and imaging studies  CBC: Recent Labs  Lab 07/13/20 2020 07/14/20 0443  WBC 8.3 9.3  NEUTROABS 5.6 6.7  HGB 13.2 13.0  HCT 42.5 41.5  MCV 88.5 89.2  PLT 227 223    Basic Metabolic Panel: Recent Labs  Lab 07/13/20 2020 07/14/20 0443 07/15/20 0549 07/16/20 0559  NA 134* 138 139 144  K 4.1 4.3 3.8 3.8  CL 102 103 104 105  CO2 23 26 23 29   GLUCOSE 115* 127* 111* 99  BUN 18 20 29* 31*  CREATININE 1.56* 1.57* 1.60* 1.61*  CALCIUM 8.6* 8.6* 8.7* 8.7*  MG 1.9 2.0 1.9 1.9    GFR: Estimated Creatinine Clearance: 72.6 mL/min (A) (by C-G formula based on SCr of 1.61 mg/dL (H)).  Liver Function Tests: Recent Labs  Lab 07/13/20 2020 07/14/20 0443  AST 44* 39  ALT 70* 62*  ALKPHOS 63 56  BILITOT 1.0 1.1  PROT 6.1* 5.6*  ALBUMIN 2.9* 2.7*    CBG: No results for input(s): GLUCAP in the last 168 hours.  Recent Results (from the past 240 hour(s))  Resp Panel by RT-PCR (Flu A&B, Covid) Nasopharyngeal Swab     Status: None   Collection Time: 07/13/20  8:03 PM   Specimen: Nasopharyngeal Swab; Nasopharyngeal(NP) swabs in vial transport medium  Result Value Ref Range Status   SARS Coronavirus 2 by RT PCR NEGATIVE NEGATIVE Final    Comment: (NOTE) SARS-CoV-2 target nucleic acids are NOT DETECTED.  The SARS-CoV-2  RNA is generally detectable in upper respiratory specimens during the acute phase of infection. The lowest concentration of SARS-CoV-2 viral copies this assay can detect is 138 copies/mL. A negative result does not preclude SARS-Cov-2 infection and should not be used as the sole basis for treatment or other patient management decisions. A negative result may occur with  improper specimen collection/handling, submission of specimen other than nasopharyngeal swab, presence of viral mutation(s) within the areas targeted by this assay, and inadequate number of viral copies(<138 copies/mL). A negative result must be combined with clinical observations, patient history, and epidemiological  information. The expected result is Negative.  Fact Sheet for Patients:  BloggerCourse.com  Fact Sheet for Healthcare Providers:  SeriousBroker.it  This test is no t yet approved or cleared by the [place] FDA and  has been authorized for detection and/or diagnosis of SARS-CoV-2 by FDA under an Emergency Use Authorization (EUA). This EUA will remain  in effect (meaning this test can be used) for the duration of the COVID-19 declaration under Section 564(b)(1) of the Act, 21 U.S.C.section 360bbb-3(b)(1), unless the authorization is terminated  or revoked sooner.       Influenza A by PCR NEGATIVE NEGATIVE Final   Influenza B by PCR NEGATIVE NEGATIVE Final    Comment: (NOTE) The Xpert Xpress SARS-CoV-2/FLU/RSV plus assay is intended as an aid in the diagnosis of influenza from Nasopharyngeal swab specimens and should not be used as a sole basis for treatment. Nasal washings and aspirates are unacceptable for Xpert Xpress SARS-CoV-2/FLU/RSV testing.  Fact Sheet for Patients: BloggerCourse.com  Fact Sheet for Healthcare Providers: SeriousBroker.it  This test is not yet approved or cleared by the  [place] FDA and has been authorized for detection and/or diagnosis of SARS-CoV-2 by FDA under an Emergency Use Authorization (EUA). This EUA will remain in effect (meaning this test can be used) for the duration of the COVID-19 declaration under Section 564(b)(1) of the Act, 21 U.S.C. section 360bbb-3(b)(1), unless the authorization is terminated or revoked.  Performed at [facility], [address] [place], [place] [address]      Radiology Studies: ECHOCARDIOGRAM COMPLETE  Result Date: 07/14/2020    ECHOCARDIOGRAM REPORT   Patient Name:   CHASE ARNALL Date of Exam: 07/14/2020 Medical Rec #:  [id number]        Height:       74.0 in Accession #:    [id number]       Weight:       249.6 lb Date of Birth:  September 22, 1970         BSA:          2.388 m Patient Age:    49 years         BP:           131/89 mmHg Patient Gender: M                HR:           85 bpm. [facility] Procedure: 2D Echo, Cardiac Doppler and Color Doppler Indications:    CHF  History:        Patient has prior history of Echocardiogram examinations. CHF,                 Signs/Symptoms:Shortness of Breath and LE edema, AKI; Risk                 Factors:Hypertension. COVID, elevated troponin.  Sonographer:    Lavenia Atlas RDCS Referring Phys: [id number] ASIA B ZIERLE-GHOSH IMPRESSIONS  1. Left ventricular ejection fraction, by estimation, is 20 to 25%. The left ventricle has severely decreased function. The left ventricle demonstrates global hypokinesis. The left ventricular internal cavity size was mildly dilated. There is mild left ventricular hypertrophy. Left ventricular diastolic parameters are indeterminate.  2. Right ventricular systolic function is mildly reduced. The right ventricular size is moderately enlarged. There is normal pulmonary artery systolic pressure.  3. Left atrial size was mildly dilated.  4. Right atrial size was mildly dilated.  5. The mitral valve is abnormal. Mild  mitral valve  regurgitation. No evidence of mitral stenosis.  6. The aortic valve is tricuspid. There is mild calcification of the aortic valve. There is mild thickening of the aortic valve. Aortic valve regurgitation is not visualized. No aortic stenosis is present.  7. The inferior vena cava is dilated in size with <50% respiratory variability, suggesting right atrial pressure of 15 mmHg. FINDINGS  Left Ventricle: Left ventricular ejection fraction, by estimation, is 20 to 25%. The left ventricle has severely decreased function. The left ventricle demonstrates global hypokinesis. The left ventricular internal cavity size was mildly dilated. There is mild left ventricular hypertrophy. Left ventricular diastolic parameters are indeterminate. Right Ventricle: The right ventricular size is moderately enlarged. Right vetricular wall thickness was not assessed. Right ventricular systolic function is mildly reduced. There is normal pulmonary artery systolic pressure. The tricuspid regurgitant velocity is 2.26 m/s, and with an assumed right atrial pressure of 3 mmHg, the estimated right ventricular systolic pressure is 23.4 mmHg. Left Atrium: Left atrial size was mildly dilated. Right Atrium: Right atrial size was mildly dilated. Pericardium: There is no evidence of pericardial effusion. Mitral Valve: The mitral valve is abnormal. There is mild thickening of the mitral valve leaflet(s). There is mild calcification of the mitral valve leaflet(s). Mild mitral annular calcification. Mild mitral valve regurgitation. No evidence of mitral valve stenosis. Tricuspid Valve: The tricuspid valve is normal in structure. Tricuspid valve regurgitation is mild . No evidence of tricuspid stenosis. Aortic Valve: The aortic valve is tricuspid. There is mild calcification of the aortic valve. There is mild thickening of the aortic valve. There is mild aortic valve annular calcification. Aortic valve regurgitation is not visualized. No aortic stenosis   is present. Aortic valve mean gradient measures 2.5 mmHg. Aortic valve peak gradient measures 4.1 mmHg. Aortic valve area, by VTI measures 2.57 cm. Pulmonic Valve: The pulmonic valve was not well visualized. Pulmonic valve regurgitation is trivial. No evidence of pulmonic stenosis. Aorta: The aortic root is normal in size and structure. Pulmonary Artery: Mild pulmonary HTN, PASP is 35 mmHg. Venous: The inferior vena cava is dilated in size with less than 50% respiratory variability, suggesting right atrial pressure of 15 mmHg. IAS/Shunts: No atrial level shunt detected by color flow Doppler.  LEFT VENTRICLE PLAX 2D LVIDd:         5.93 cm  Diastology LVIDs:         5.15 cm  LV e' medial:    4.68 cm/s LV PW:         1.21 cm  LV E/e' medial:  15.0 LV IVS:        1.18 cm  LV e' lateral:   6.64 cm/s LVOT diam:     2.10 cm  LV E/e' lateral: 10.5 LV SV:         48 LV SV Index:   20 LVOT Area:     3.46 cm  RIGHT VENTRICLE RV Basal diam:  4.14 cm RV S prime:     7.62 cm/s TAPSE (M-mode): 1.7 cm LEFT ATRIUM             Index       RIGHT ATRIUM           Index LA diam:        4.50 cm 1.88 cm/m  RA Area:     24.10 cm LA Vol (A2C):   52.1 ml 21.82 ml/m RA Volume:   78.10 ml  32.71 ml/m LA Vol (A4C):  56.7 ml 23.74 ml/m LA Biplane Vol: 59.5 ml 24.92 ml/m  AORTIC VALVE AV Area (Vmax):    2.98 cm AV Area (Vmean):   2.62 cm AV Area (VTI):     2.57 cm AV Vmax:           101.64 cm/s AV Vmean:          76.542 cm/s AV VTI:            0.186 m AV Peak Grad:      4.1 mmHg AV Mean Grad:      2.5 mmHg LVOT Vmax:         87.50 cm/s LVOT Vmean:        57.900 cm/s LVOT VTI:          0.138 m LVOT/AV VTI ratio: 0.74  AORTA Ao Root diam: 3.60 cm MITRAL VALVE               TRICUSPID VALVE MV Area (PHT): 3.97 cm    TR Peak grad:   20.4 mmHg MV Decel Time: 191 msec    TR Vmax:        226.00 cm/s MV E velocity: 70.00 cm/s MV A velocity: 39.40 cm/s  SHUNTS MV E/A ratio:  1.78        Systemic VTI:  0.14 m                            Systemic  Diam: 2.10 cm Dina Rich MD Electronically signed by Dina Rich MD Signature Date/Time: 07/14/2020/4:20:21 PM    Final    Scheduled Meds: . amLODipine  5 mg Oral Daily  . furosemide  40 mg Intravenous Q12H  . heparin  5,000 Units Subcutaneous Q8H  . metoprolol tartrate  50 mg Oral BID  . potassium chloride  10 mEq Oral Daily   Continuous Infusions:   LOS: 2 days   Time spent: 35 mins   Clavin Ruhlman Laural Benes, MD How to contact the [facility] Attending or Consulting provider 7A - 7P or covering provider during after hours 7P -7A, for this patient?  1. Check the care team in [facility] and look for a) attending/consulting [facility] provider listed and b) the [facility] team listed 2. Log into [email or web address] and use [place]'s universal password to access. If you do not have the password, please contact the hospital operator. 3. Locate the [facility] provider you are looking for under [facility] and page to a number that you can be directly reached. 4. If you still have difficulty reaching the provider, please page the [facility] (Director on Call) for the Hospitalists listed on amion for assistance.  07/16/2020, 11:30 AM

## 2020-07-17 ENCOUNTER — Inpatient Hospital Stay (HOSPITAL_COMMUNITY): Payer: 59

## 2020-07-17 DIAGNOSIS — I10 Essential (primary) hypertension: Secondary | ICD-10-CM | POA: Diagnosis not present

## 2020-07-17 DIAGNOSIS — I509 Heart failure, unspecified: Secondary | ICD-10-CM | POA: Diagnosis not present

## 2020-07-17 DIAGNOSIS — N179 Acute kidney failure, unspecified: Secondary | ICD-10-CM | POA: Diagnosis not present

## 2020-07-17 DIAGNOSIS — N182 Chronic kidney disease, stage 2 (mild): Secondary | ICD-10-CM

## 2020-07-17 DIAGNOSIS — I429 Cardiomyopathy, unspecified: Secondary | ICD-10-CM

## 2020-07-17 DIAGNOSIS — R778 Other specified abnormalities of plasma proteins: Secondary | ICD-10-CM | POA: Diagnosis not present

## 2020-07-17 LAB — BASIC METABOLIC PANEL
Anion gap: 11 (ref 5–15)
BUN: 36 mg/dL — ABNORMAL HIGH (ref 6–20)
CO2: 26 mmol/L (ref 22–32)
Calcium: 8.5 mg/dL — ABNORMAL LOW (ref 8.9–10.3)
Chloride: 106 mmol/L (ref 98–111)
Creatinine, Ser: 1.68 mg/dL — ABNORMAL HIGH (ref 0.61–1.24)
GFR, Estimated: 49 mL/min — ABNORMAL LOW (ref >60)
Glucose, Bld: 160 mg/dL — ABNORMAL HIGH (ref 70–99)
Potassium: 3.5 mmol/L (ref 3.5–5.1)
Sodium: 143 mmol/L (ref 135–145)

## 2020-07-17 LAB — BRAIN NATRIURETIC PEPTIDE: B Natriuretic Peptide: 1178 pg/mL — ABNORMAL HIGH (ref 0.0–100.0)

## 2020-07-17 LAB — VITAMIN D 25 HYDROXY (VIT D DEFICIENCY, FRACTURES): Vit D, 25-Hydroxy: 29.52 ng/mL — ABNORMAL LOW (ref 30–100)

## 2020-07-17 LAB — MAGNESIUM: Magnesium: 1.9 mg/dL (ref 1.7–2.4)

## 2020-07-17 MED ORDER — SPIRONOLACTONE 25 MG PO TABS
12.5000 mg | ORAL_TABLET | Freq: Every day | ORAL | Status: DC
  Administered 2020-07-17 – 2020-07-20 (×4): 12.5 mg via ORAL
  Filled 2020-07-17: qty 0.5
  Filled 2020-07-17 (×2): qty 1
  Filled 2020-07-17 (×4): qty 0.5

## 2020-07-17 MED ORDER — LIVING BETTER WITH HEART FAILURE BOOK: Freq: Once | Status: AC

## 2020-07-17 MED ORDER — METOPROLOL SUCCINATE ER 50 MG PO TB24
50.0000 mg | ORAL_TABLET | Freq: Every day | ORAL | Status: DC
  Administered 2020-07-17 – 2020-07-21 (×5): 50 mg via ORAL
  Filled 2020-07-17 (×5): qty 1

## 2020-07-17 NOTE — Progress Notes (Signed)
PROGRESS NOTE   Tony Meyer  SFK:[id number] DOB: May 31, 1970 DOA: 07/13/2020 PCP: Patient, No Pcp Per (Inactive)   Chief Complaint  Patient presents with  . Shortness of Breath   Level of care: Telemetry  Brief Admission History:  50 y.o. male, with history of admission for Covid in December, hypertension, presents to the ED with a chief complaint of shortness of breath.  Patient reports that he has been short of breath since his discharge in December 2021.  He reports he stopped taking his medications because he thought that they were making him short of breath.  Early last 3 weeks he has had an acute change with worsening shortness of breath, and tightness in his abdomen.  Over the last week he has had increased peripheral edema.  Patient reports that he has had an increase in fatigue with exertion.  He denies any chest pain, palpitations, nausea, vomiting.  He does report a decrease in urinary output over the past few days.  He attributes this to the fact that he has been drinking less water because he has been trying to prevent the swelling.  Patient reports no dysuria or hematuria.  Patient has no history of CHF.  He does not follow any diet, and reports that he definitely does not follow a low-sodium diet.  Patient does not check his blood pressure at home, in the setting of not taking his medications his blood pressure on admission was 135/101.  Patient has no other complaints at this time.  Patient does not smoke, does not drink, does not use illicit drugs.  Patient is not vaccinated for COVID.  Patient is full code.    Assessment & Plan:   Active Problems:   Essential hypertension   AKI (acute kidney injury) (HCC)   CHF exacerbation (HCC)   Elevated troponin   Acute heart failure (HCC)  1. 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  He is  diuresing well on IV lasix.  2D echocardiogram reports EF 20-25% severe LV dysfunction and also has severe global hypokinesis and some right heart failure.  Will consult inpatient cardiology service to see on 4/4. TSH WNL.   He is now on IV lasix 40 mg daily.  2. Suspected stage 2 CKD - likely from poorly controlled hypertension.  Follow creatinine with diuresis. renal US unremarkable.  3. Elevated troponin - likely from demand ischemia associated with acute systolic heart failure, do not suspect ACS.  4. Social: Pt wants to apply for disability, he has spoke with Child psychotherapist and encouraged to apply thru social security office.    DVT prophylaxis: SCDs Code Status: Full  Family Communication: counseled patient at bedside, verbalized understanding Disposition:  Status is: Inpatient   The patient will require care spanning > 2 midnights and should be moved to inpatient because: Ongoing diagnostic testing needed not appropriate for outpatient work up, IV treatments appropriate due to intensity of illness or inability to take PO and Inpatient level of care appropriate due to severity of illness  Dispo: The patient is from: Home              Anticipated d/c is to: Home              Patient currently is not medically stable to d/c.   Difficult to place patient No  Consultants:   n/a  Procedures:   2D echo 07/14/20  Tentative cardiac cath  Antimicrobials:  n/a   Subjective: Pt reports less shortness of breath, no chest pain symptoms.    Objective: Vitals:   07/16/20 2108 07/17/20 0509 07/17/20 0600 07/17/20 1331  BP: (!) 120/91 105/86  115/77  Pulse: 95 93  89  Resp: 16 16  20   Temp: 98.2 F (36.8 C) (!) 97.5 F (36.4 C)  97.7 F (36.5 C)  TempSrc: Oral   Oral  SpO2: 98% 100%  96%  Weight:  106.3 kg 105 kg   Height:        Intake/Output Summary (Last 24 hours) at 07/17/2020 1437 Last data filed at 07/17/2020 1300 Gross per 24 hour  Intake 840 ml  Output 1450 ml  Net -610  ml   Filed Weights   07/16/20 0600 07/17/20 0509 07/17/20 0600  Weight: 113.1 kg 106.3 kg 105 kg    Examination:  General exam: Awake, alert, cooperative, Appears calm and comfortable  Respiratory system: crackles at bases.  Clear to auscultation. Respiratory effort normal. Cardiovascular system: normal S1 & S2 heard. No JVD, murmurs, rubs, gallops or clicks. No pedal edema. Gastrointestinal system: Abdomen is nondistended, soft and nontender. No organomegaly or masses felt. Normal bowel sounds heard. Central nervous system: Alert and oriented. No focal neurological deficits. Extremities: 1+ pitting edema BLEs Symmetric 5 x 5 power.  Ted Hoses on.   Skin: No rashes, lesions or ulcers Psychiatry: Judgement and insight appear normal. Mood & affect appropriate.   Data Reviewed: I have personally reviewed following labs and imaging studies  CBC: Recent Labs  Lab 07/13/20 2020 07/14/20 0443  WBC 8.3 9.3  NEUTROABS 5.6 6.7  HGB 13.2 13.0  HCT 42.5 41.5  MCV 88.5 89.2  PLT 227 223    Basic Metabolic Panel: Recent Labs  Lab 07/13/20 2020 07/14/20 0443 07/15/20 0549 07/16/20 0559 07/17/20 0456  NA 134* 138 139 144 143  K 4.1 4.3 3.8 3.8 3.5  CL 102 103 104 105 106  CO2 23 26 23 29 26   GLUCOSE 115* 127* 111* 99 160*  BUN 18 20 29* 31* 36*  CREATININE 1.56* 1.57* 1.60* 1.61* 1.68*  CALCIUM 8.6* 8.6* 8.7* 8.7* 8.5*  MG 1.9 2.0 1.9 1.9 1.9    GFR: Estimated Creatinine Clearance: 67.2 mL/min (A) (by C-G formula based on SCr of 1.68 mg/dL (H)).  Liver Function Tests: Recent Labs  Lab 07/13/20 2020 07/14/20 0443  AST 44* 39  ALT 70* 62*  ALKPHOS 63 56  BILITOT 1.0 1.1  PROT 6.1* 5.6*  ALBUMIN 2.9* 2.7*    CBG: No results for input(s): GLUCAP in the last 168 hours.  Recent Results (from the past 240 hour(s))  Resp Panel by RT-PCR (Flu A&B, Covid) Nasopharyngeal Swab     Status: None   Collection Time: 07/13/20  8:03 PM   Specimen: Nasopharyngeal Swab;  Nasopharyngeal(NP) swabs in vial transport medium  Result Value Ref Range Status   SARS Coronavirus 2 by RT PCR NEGATIVE NEGATIVE Final    Comment: (NOTE) SARS-CoV-2 target nucleic acids are NOT DETECTED.  The SARS-CoV-2 RNA is generally detectable in upper respiratory specimens during the acute phase of infection. The lowest concentration of SARS-CoV-2 viral copies this assay can detect is 138 copies/mL. A negative result does not preclude SARS-Cov-2 infection and should not be used as the sole basis for treatment or other patient management decisions. A negative result may occur with  improper specimen collection/handling, submission of  specimen other than nasopharyngeal swab, presence of viral mutation(s) within the areas targeted by this assay, and inadequate number of viral copies(<138 copies/mL). A negative result must be combined with clinical observations, patient history, and epidemiological information. The expected result is Negative.  Fact Sheet for Patients:  BloggerCourse.com  Fact Sheet for Healthcare Providers:  SeriousBroker.it  This test is no t yet approved or cleared by the [place] FDA and  has been authorized for detection and/or diagnosis of SARS-CoV-2 by FDA under an Emergency Use Authorization (EUA). This EUA will remain  in effect (meaning this test can be used) for the duration of the COVID-19 declaration under Section 564(b)(1) of the Act, 21 U.S.C.section 360bbb-3(b)(1), unless the authorization is terminated  or revoked sooner.       Influenza A by PCR NEGATIVE NEGATIVE Final   Influenza B by PCR NEGATIVE NEGATIVE Final    Comment: (NOTE) The Xpert Xpress SARS-CoV-2/FLU/RSV plus assay is intended as an aid in the diagnosis of influenza from Nasopharyngeal swab specimens and should not be used as a sole basis for treatment. Nasal washings and aspirates are unacceptable for Xpert Xpress  SARS-CoV-2/FLU/RSV testing.  Fact Sheet for Patients: BloggerCourse.com  Fact Sheet for Healthcare Providers: SeriousBroker.it  This test is not yet approved or cleared by the [place] FDA and has been authorized for detection and/or diagnosis of SARS-CoV-2 by FDA under an Emergency Use Authorization (EUA). This EUA will remain in effect (meaning this test can be used) for the duration of the COVID-19 declaration under Section 564(b)(1) of the Act, 21 U.S.C. section 360bbb-3(b)(1), unless the authorization is terminated or revoked.  Performed at [facility], [address] [place], [place] [address]      Radiology Studies: US RENAL  Result Date: 07/16/2020 CLINICAL DATA:  Chronic renal disease.  Hypertension. EXAM: RENAL / URINARY TRACT ULTRASOUND COMPLETE COMPARISON:  None. FINDINGS: Right Kidney: Renal measurements: 11 x 4.7 x 6.3 cm = volume: 177 mL. Contains a 2.6 cm upper pole cyst. Left Kidney: Renal measurements: 11 x 6.8 x 5.3 cm = volume: 203 mL. Echogenicity within normal limits. No mass or hydronephrosis visualized. Bladder: Appears normal for degree of bladder distention. Other: None. IMPRESSION: 1. No significant abnormalities identified. There is a 2.6 cm upper pole cyst on the right of no significance. Electronically Signed   By: Gerome Sam III M.D   On: 07/16/2020 18:17   DG CHEST PORT 1 VIEW  Result Date: 07/17/2020 CLINICAL DATA:  50 year old male with increased shortness of breath and bilateral lower extremity edema EXAM: PORTABLE CHEST 1 VIEW COMPARISON:  Prior chest x-ray 07/13/2020 FINDINGS: Stable enlargement of the cardiopericardial silhouette. Pulmonary vascular congestion is present without overt edema. No pleural effusion or pneumothorax. No focal airspace opacity. No acute osseous abnormality. IMPRESSION: Stable cardiomegaly and pulmonary vascular congestion without overt pulmonary edema. Electronically  Signed   By: Malachy Moan M.D.   On: 07/17/2020 08:06   Scheduled Meds: . heparin  5,000 Units Subcutaneous Q8H  . metoprolol succinate  50 mg Oral Daily  . potassium chloride  10 mEq Oral Daily  . spironolactone  12.5 mg Oral Daily   Continuous Infusions:   LOS: 3 days   Time spent: 35 mins   Kaseem Vastine Laural Benes, MD How to contact the [facility] Attending or Consulting provider 7A - 7P or covering provider during after hours 7P -7A, for this patient?  1. Check the care team in [facility] and look for a) attending/consulting [facility] provider listed  and b) the [facility] team listed 2. Log into [email or web address] and use [place]'s universal password to access. If you do not have the password, please contact the hospital operator. 3. Locate the [facility] provider you are looking for under [facility] and page to a number that you can be directly reached. 4. If you still have difficulty reaching the provider, please page the [facility] (Director on Call) for the Hospitalists listed on amion for assistance.  07/17/2020, 2:37 PM

## 2020-07-17 NOTE — Consult Note (Addendum)
Cardiology Consultation:   Patient ID: Tony Meyer MRN: [id number]; DOB: 1971/04/11  Admit date: 07/13/2020 Date of Consult: 07/17/2020  PCP:  Patient, No Pcp Per (Inactive)   [facility]  Cardiologist:  Dina Rich, MD   Advanced Practice Provider:  No care team member to display Electrophysiologist:  None   :[id number]}    Patient Profile:   Tony Meyer is a 50 y.o. male with a hx of HTN who is being seen today for the evaluation of new CHF at the request of Dr. Laural Benes.  History of Present Illness:   Mr. Tony Meyer is a 50 year old male patient with history of hypertension and COVID-19 pneumonia hospitalization in December. He was diagnosed with HTN and sent home on amlodipine 5 mg and metoprolol 50 mg BID which he stopped in Jan because he thought it was causing him to be short of breath. He complains of shortness of breath since then that has worsened and developed lower extremity edema last week. 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 He has had progressive DOE that's worsened to a point he couldn't perform his job.  2D echo here shows new LV dysfunction EF 20 to 25% with global hypokinesis.  He has diuresed 4.6 L since admission.  Creatinine was 1.26 in December and is now 1.68.   History reviewed. No pertinent past medical history.  Past Surgical History:  Procedure Laterality Date  . HERNIA REPAIR       Home Medications:  Prior to Admission medications   Medication Sig Start Date End Date Taking? Authorizing Provider  albuterol (VENTOLIN HFA) 108 (90 Base) MCG/ACT inhaler Inhale 2 puffs into the lungs every 4 (four) hours as needed for wheezing or shortness of breath. 03/17/20  Yes Johnson, Clanford L, MD  amLODipine (NORVASC) 5 MG tablet Take 1 tablet (5 mg total) by mouth daily. 03/18/20 04/17/20  Johnson, Clanford L, MD  ascorbic acid (VITAMIN C) 500 MG tablet Take 1 tablet (500 mg total) by mouth daily. 03/18/20    Johnson, Clanford L, MD  guaiFENesin-dextromethorphan (ROBITUSSIN DM) 100-10 MG/5ML syrup Take 10 mLs by mouth every 4 (four) hours as needed for cough. 03/17/20   Johnson, Clanford L, MD  metoprolol tartrate (LOPRESSOR) 50 MG tablet Take 1 tablet (50 mg total) by mouth 2 (two) times daily. Patient not taking: No sig reported 03/17/20   Cleora Fleet, MD    Inpatient Medications: Scheduled Meds: . furosemide  40 mg Intravenous Q12H  . heparin  5,000 Units Subcutaneous Q8H  . Living Better with Heart Failure Book   Does not apply Once  . metoprolol succinate  50 mg Oral Daily  . potassium chloride  10 mEq Oral Daily  . spironolactone  12.5 mg Oral Daily   Continuous Infusions:  PRN Meds: acetaminophen **OR** acetaminophen, albuterol, ondansetron **OR** ondansetron (ZOFRAN) IV, oxyCODONE, polyethylene glycol  Allergies:   No Known Allergies  Social History:   Social History   Socioeconomic History  . Marital status: Single    Spouse name: Not on file  . Number of children: Not on file  . Years of education: Not on file  . Highest education level: Not on file  Occupational History  . Not on file  Tobacco Use  . Smoking status: Never Smoker  . Smokeless tobacco: Never Used  Vaping Use  . Vaping Use: Never used  Substance and Sexual Activity  . Alcohol use: Never  . Drug  use: Never  . Sexual activity: Not on file  Other Topics Concern  . Not on file  Social History Narrative  . Not on file   Social Determinants of Health   Financial Resource Strain: Not on file  Food Insecurity: Not on file  Transportation Needs: Not on file  Physical Activity: Not on file  Stress: Not on file  Social Connections: Not on file  Intimate Partner Violence: Not on file    Family History:     No family history on file.  Parents died of diabetes per patient  ROS:  Please see the history of present illness.  Review of Systems  Constitutional: Negative.  HENT: Negative.    Cardiovascular: Positive for dyspnea on exertion and leg swelling.  Respiratory: Negative.   Endocrine: Negative.   Hematologic/Lymphatic: Negative.   Musculoskeletal: Negative.   Gastrointestinal: Negative.   Genitourinary: Negative.   Neurological: Negative.     All other ROS reviewed and negative.     Physical Exam/Data:   Vitals:   07/16/20 1327 07/16/20 2108 07/17/20 0509 07/17/20 0600  BP: 125/87 (!) 120/91 105/86   Pulse: 90 95 93   Resp: 16 16 16    Temp: (!) 97.4 F (36.3 C) 98.2 F (36.8 C) (!) 97.5 F (36.4 C)   TempSrc: Oral Oral    SpO2: 96% 98% 100%   Weight:   106.3 kg 105 kg  Height:        Intake/Output Summary (Last 24 hours) at 07/17/2020 0859 Last data filed at 07/17/2020 0600 Gross per 24 hour  Intake 360 ml  Output 2700 ml  Net -2340 ml   Last 3 Weights 07/17/2020 07/17/2020 07/16/2020  Weight (lbs) 231 lb 8 oz 234 lb 5.6 oz 249 lb 5.4 oz  Weight (kg) 105.008 kg 106.3 kg 113.1 kg     Body mass index is 29.72 kg/m.  General:  Well nourished, well developed, in no acute distress HEENT: normal Lymph: no adenopathy Neck: no JVD Endocrine:  No thryomegaly Vascular: No carotid bruits; FA pulses 2+ bilaterally without bruits  Cardiac:  normal S1, S2; RRR; no murmur   Lungs:  clear to auscultation bilaterally, no wheezing, rhonchi or rales  Abd: soft, nontender, no hepatomegaly  Ext: no edema Musculoskeletal:  No deformities, BUE and BLE strength normal and equal Skin: warm and dry  Neuro:  CNs 2-12 intact, no focal abnormalities noted Psych:  Normal affect   EKG:  The EKG was personally reviewed and demonstrates:  Sinus tachycardia 116/m with poor R wave progression ant.-similar to EKG in December Telemetry:  Telemetry was personally reviewed and demonstrates:  NSR  Relevant CV Studies: See echo below  Laboratory Data:  High Sensitivity Troponin:   Recent Labs  Lab 07/13/20 2020 07/13/20 2305  TROPONINIHS 64* 60*     Chemistry Recent Labs   Lab 07/15/20 0549 07/16/20 0559 07/17/20 0456  NA 139 144 143  K 3.8 3.8 3.5  CL 104 105 106  CO2 23 29 26   GLUCOSE 111* 99 160*  BUN 29* 31* 36*  CREATININE 1.60* 1.61* 1.68*  CALCIUM 8.7* 8.7* 8.5*  GFRNONAA 52* 51* 49*  ANIONGAP 12 10 11     Recent Labs  Lab 07/13/20 2020 07/14/20 0443  PROT 6.1* 5.6*  ALBUMIN 2.9* 2.7*  AST 44* 39  ALT 70* 62*  ALKPHOS 63 56  BILITOT 1.0 1.1   Hematology Recent Labs  Lab 07/13/20 2020 07/14/20 0443  WBC 8.3 9.3  RBC 4.80 4.65  HGB 13.2 13.0  HCT 42.5 41.5  MCV 88.5 89.2  MCH 27.5 28.0  MCHC 31.1 31.3  RDW 15.7* 15.8*  PLT 227 223   BNP Recent Labs  Lab 07/15/20 0549 07/16/20 0559 07/17/20 0456  BNP 1,419.0* 1,244.0* 1,178.0*    DDimer No results for input(s): DDIMER in the last 168 hours.   Radiology/Studies:  DG Chest 2 View  Result Date: 07/13/2020 CLINICAL DATA:  Worsening shortness of breath. Patient was diagnosed with COVID-19 in December 2021. EXAM: CHEST - 2 VIEW COMPARISON:  March 14, 2020 FINDINGS: The cardiac silhouette is enlarged. Mediastinal contours appear intact. Scattered nodular and linear airspace opacities throughout the bilateral lung fields. No evidence of pleural effusion or pneumothorax. Osseous structures are without acute abnormality. Soft tissues are grossly normal. IMPRESSION: 1. Scattered nodular and linear airspace opacities throughout the bilateral lung fields. Findings may represent diffuse airspace disease or early mixed pattern pulmonary edema. 2. Enlarged cardiac silhouette. Electronically Signed   By: Ted Mcalpine M.D.   On: 07/13/2020 20:53   US RENAL  Result Date: 07/16/2020 CLINICAL DATA:  Chronic renal disease.  Hypertension. EXAM: RENAL / URINARY TRACT ULTRASOUND COMPLETE COMPARISON:  None. FINDINGS: Right Kidney: Renal measurements: 11 x 4.7 x 6.3 cm = volume: 177 mL. Contains a 2.6 cm upper pole cyst. Left Kidney: Renal measurements: 11 x 6.8 x 5.3 cm = volume: 203 mL.  Echogenicity within normal limits. No mass or hydronephrosis visualized. Bladder: Appears normal for degree of bladder distention. Other: None. IMPRESSION: 1. No significant abnormalities identified. There is a 2.6 cm upper pole cyst on the right of no significance. Electronically Signed   By: Gerome Sam III M.D   On: 07/16/2020 18:17   DG CHEST PORT 1 VIEW  Result Date: 07/17/2020 CLINICAL DATA:  50 year old male with increased shortness of breath and bilateral lower extremity edema EXAM: PORTABLE CHEST 1 VIEW COMPARISON:  Prior chest x-ray 07/13/2020 FINDINGS: Stable enlargement of the cardiopericardial silhouette. Pulmonary vascular congestion is present without overt edema. No pleural effusion or pneumothorax. No focal airspace opacity. No acute osseous abnormality. IMPRESSION: Stable cardiomegaly and pulmonary vascular congestion without overt pulmonary edema. Electronically Signed   By: Malachy Moan M.D.   On: 07/17/2020 08:06   ECHOCARDIOGRAM COMPLETE  Result Date: 07/14/2020    ECHOCARDIOGRAM REPORT   Patient Name:   JACOBO CEPERO Date of Exam: 07/14/2020 Medical Rec #:  [id number]        Height:       74.0 in Accession #:    [id number]       Weight:       249.6 lb Date of Birth:  07-16-1970         BSA:          2.388 m Patient Age:    49 years         BP:           131/89 mmHg Patient Gender: M                HR:           85 bpm. [facility] Procedure: 2D Echo, Cardiac Doppler and Color Doppler Indications:    CHF  History:        Patient has prior history of Echocardiogram examinations. CHF,                 Signs/Symptoms:Shortness of Breath and LE edema, AKI; Risk  Factors:Hypertension. COVID, elevated troponin.  Sonographer:    Lavenia AtlasBrooke Strickland RDCS Referring Phys: [id number] ASIA B ZIERLE-GHOSH IMPRESSIONS  1. Left ventricular ejection fraction, by estimation, is 20 to 25%. The left ventricle has severely decreased function. The left ventricle demonstrates  global hypokinesis. The left ventricular internal cavity size was mildly dilated. There is mild left ventricular hypertrophy. Left ventricular diastolic parameters are indeterminate.  2. Right ventricular systolic function is mildly reduced. The right ventricular size is moderately enlarged. There is normal pulmonary artery systolic pressure.  3. Left atrial size was mildly dilated.  4. Right atrial size was mildly dilated.  5. The mitral valve is abnormal. Mild mitral valve regurgitation. No evidence of mitral stenosis.  6. The aortic valve is tricuspid. There is mild calcification of the aortic valve. There is mild thickening of the aortic valve. Aortic valve regurgitation is not visualized. No aortic stenosis is present.  7. The inferior vena cava is dilated in size with <50% respiratory variability, suggesting right atrial pressure of 15 mmHg. FINDINGS  Left Ventricle: Left ventricular ejection fraction, by estimation, is 20 to 25%. The left ventricle has severely decreased function. The left ventricle demonstrates global hypokinesis. The left ventricular internal cavity size was mildly dilated. There is mild left ventricular hypertrophy. Left ventricular diastolic parameters are indeterminate. Right Ventricle: The right ventricular size is moderately enlarged. Right vetricular wall thickness was not assessed. Right ventricular systolic function is mildly reduced. There is normal pulmonary artery systolic pressure. The tricuspid regurgitant velocity is 2.26 m/s, and with an assumed right atrial pressure of 3 mmHg, the estimated right ventricular systolic pressure is 23.4 mmHg. Left Atrium: Left atrial size was mildly dilated. Right Atrium: Right atrial size was mildly dilated. Pericardium: There is no evidence of pericardial effusion. Mitral Valve: The mitral valve is abnormal. There is mild thickening of the mitral valve leaflet(s). There is mild calcification of the mitral valve leaflet(s). Mild mitral annular  calcification. Mild mitral valve regurgitation. No evidence of mitral valve stenosis. Tricuspid Valve: The tricuspid valve is normal in structure. Tricuspid valve regurgitation is mild . No evidence of tricuspid stenosis. Aortic Valve: The aortic valve is tricuspid. There is mild calcification of the aortic valve. There is mild thickening of the aortic valve. There is mild aortic valve annular calcification. Aortic valve regurgitation is not visualized. No aortic stenosis  is present. Aortic valve mean gradient measures 2.5 mmHg. Aortic valve peak gradient measures 4.1 mmHg. Aortic valve area, by VTI measures 2.57 cm. Pulmonic Valve: The pulmonic valve was not well visualized. Pulmonic valve regurgitation is trivial. No evidence of pulmonic stenosis. Aorta: The aortic root is normal in size and structure. Pulmonary Artery: Mild pulmonary HTN, PASP is 35 mmHg. Venous: The inferior vena cava is dilated in size with less than 50% respiratory variability, suggesting right atrial pressure of 15 mmHg. IAS/Shunts: No atrial level shunt detected by color flow Doppler.  LEFT VENTRICLE PLAX 2D LVIDd:         5.93 cm  Diastology LVIDs:         5.15 cm  LV e' medial:    4.68 cm/s LV PW:         1.21 cm  LV E/e' medial:  15.0 LV IVS:        1.18 cm  LV e' lateral:   6.64 cm/s LVOT diam:     2.10 cm  LV E/e' lateral: 10.5 LV SV:         48 LV SV Index:  20 LVOT Area:     3.46 cm  RIGHT VENTRICLE RV Basal diam:  4.14 cm RV S prime:     7.62 cm/s TAPSE (M-mode): 1.7 cm LEFT ATRIUM             Index       RIGHT ATRIUM           Index LA diam:        4.50 cm 1.88 cm/m  RA Area:     24.10 cm LA Vol (A2C):   52.1 ml 21.82 ml/m RA Volume:   78.10 ml  32.71 ml/m LA Vol (A4C):   56.7 ml 23.74 ml/m LA Biplane Vol: 59.5 ml 24.92 ml/m  AORTIC VALVE AV Area (Vmax):    2.98 cm AV Area (Vmean):   2.62 cm AV Area (VTI):     2.57 cm AV Vmax:           101.64 cm/s AV Vmean:          76.542 cm/s AV VTI:            0.186 m AV Peak Grad:       4.1 mmHg AV Mean Grad:      2.5 mmHg LVOT Vmax:         87.50 cm/s LVOT Vmean:        57.900 cm/s LVOT VTI:          0.138 m LVOT/AV VTI ratio: 0.74  AORTA Ao Root diam: 3.60 cm MITRAL VALVE               TRICUSPID VALVE MV Area (PHT): 3.97 cm    TR Peak grad:   20.4 mmHg MV Decel Time: 191 msec    TR Vmax:        226.00 cm/s MV E velocity: 70.00 cm/s MV A velocity: 39.40 cm/s  SHUNTS MV E/A ratio:  1.78        Systemic VTI:  0.14 m                            Systemic Diam: 2.10 cm Dina Rich MD Electronically signed by Dina Rich MD Signature Date/Time: 07/14/2020/4:20:21 PM    Final      Assessment and Plan:   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 On amlodipine and metoprolol. Can't use ACEI/ARB/Entresto at this time with rising Crt but reassess as an outpatient. 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  Acute systolic CHF has diuresed 4.6 L since admission on Lasix 40 mg twice daily-compensated.. 2 gram sodium diet.  HTN previously untreated on amlodipine 5 mg daily, metoprolol 50 mg twice daily  CKD the stage II creatinine 1.68 this morning   Risk Assessment/Risk Scores:        New York Heart Association (NYHA) Functional Class NYHA Class III        For questions or updates, please contact [facility] Please consult [email or web address] for contact info under    Signed, Jacolyn Reedy, PA-C  07/17/2020 8:59 AM    Attending note  Patient seen and discussed with PA Geni Bers, I agree with her documentation. 50 yo male history of HTN and COVID in December admitted with SOB. Reports SOB/DOE since diagonsed with COVID in December. Over last few weeks developed abdominal distension, LE edema.    Admit  labs K 4.1 Cr 1.56 BUN 18 AST 44 ALT 70 Alb 2.9 WBC 8.3 Hgb 13.2 Plt 227 Mg 1.9 BNP 1510 TSH 2.1 hstrop 64-->60 CXR  pulm edema, cardiomegaly EKG Sinus tach COVID neg  Echo LVEF 20-25%, indet dd, mild RV dysfunction with mod RV enlargement, mild MR  Patient admitted with acute systolic HF. I/Os data is incomplete, negative at least 4.6 L since admission. He is on IV lasix 40mg  bid, uptrend in Cr to 1.68. BNP trended down to 1178, CXR today with vascular congestion.   Change lopressor to toprol 50mg  daily, start aldactone 12.5mg  daily. Hold on ACE/ARB/ARNI given renal function and ongoing diuresis. Pending renal function when more euvolemic consider ACE/ARB/ARNI vs hydral/nitrates. Stop his norvasc to allow room with bp for his CHF meds. Given the degree of his systolic dysfunction would plan on inpatient LHC/RHC once more euvolemic and pending renal function. With uptrend in Cr will dose IV lasix 40mg  just once today.   Dina Rich MD

## 2020-07-17 NOTE — TOC Initial Note (Addendum)
Transition of Care [facility]) - Initial/Assessment Note    Patient Details  Name: Tony Meyer MRN: [id number] Date of Birth: 1970-12-17  Transition of Care [facility]) CM/SW Contact:    Karn Cassis, LCSW Phone Number: 07/17/2020, 9:21 AM  Clinical Narrative: Pt admitted due to new onset CHF. TOC received consult for CHF screening. Pt reports he lives with two of his cousins and is independent with ADLs. He indicates he has a new job and has insurance. However, pt does not have PCP. List of providers given to pt to call. Pt states cardiologist saw him this morning and provided education on CHF. LCSW reviewed with pt and order placed for RN to provide Living Better with Heart Failure information packet. LCSW also received consult for assisting pt with disability. Discussed with pt who is aware he will need to call [employer] to start disability process.                 Expected Discharge Plan: Home/Self Care Barriers to Discharge: Continued Medical Work up   Patient Goals and CMS Choice Patient states their goals for this hospitalization and ongoing recovery are:: return home      Expected Discharge Plan and Services Expected Discharge Plan: Home/Self Care In-house Referral: Clinical Social Work     Living arrangements for the past 2 months: Single Family Home                 DME Arranged: N/A DME Agency: NA                  Prior Living Arrangements/Services Living arrangements for the past 2 months: Single Family Home Lives with:: Relatives Patient language and need for interpreter reviewed:: Yes Do you feel safe going back to the place where you live?: Yes      Need for Family Participation in Patient Care: No (Comment)     Criminal Activity/Legal Involvement Pertinent to Current Situation/Hospitalization: No - Comment as needed  Activities of Daily Living      Permission Sought/Granted                  Emotional Assessment    Attitude/Demeanor/Rapport: Engaged Affect (typically observed): Accepting Orientation: : Oriented to Self,Oriented to Place,Oriented to  Time,Oriented to Situation Alcohol / Substance Use: Not Applicable Psych Involvement: No (comment)  Admission diagnosis:  CHF exacerbation (HCC) [I50.9] Acute congestive heart failure, unspecified heart failure type (HCC) [I50.9] Acute heart failure (HCC) [I50.9] Patient Active Problem List   Diagnosis Date Noted  . Elevated troponin 07/14/2020  . Acute heart failure (HCC) 07/14/2020  . CHF exacerbation (HCC) 07/13/2020  . Hypoxia 03/15/2020  . Essential hypertension 03/15/2020  . AKI (acute kidney injury) (HCC) 03/15/2020  . Pneumonia due to COVID-19 virus 03/14/2020   PCP:  Patient, No Pcp Per (Inactive) Pharmacy:   [facility] [address], [place] - [address] [place] [address] Phone: [phone] Fax: [phone]     Social Determinants of Health (SDOH) Interventions    Readmission Risk Interventions No flowsheet data found.

## 2020-07-18 ENCOUNTER — Encounter (HOSPITAL_COMMUNITY): Payer: Self-pay | Admitting: Family Medicine

## 2020-07-18 DIAGNOSIS — N179 Acute kidney failure, unspecified: Secondary | ICD-10-CM | POA: Diagnosis not present

## 2020-07-18 DIAGNOSIS — I509 Heart failure, unspecified: Secondary | ICD-10-CM | POA: Diagnosis not present

## 2020-07-18 DIAGNOSIS — N189 Chronic kidney disease, unspecified: Secondary | ICD-10-CM | POA: Diagnosis not present

## 2020-07-18 DIAGNOSIS — I5021 Acute systolic (congestive) heart failure: Secondary | ICD-10-CM | POA: Diagnosis present

## 2020-07-18 DIAGNOSIS — R778 Other specified abnormalities of plasma proteins: Secondary | ICD-10-CM | POA: Diagnosis not present

## 2020-07-18 HISTORY — DX: Acute systolic (congestive) heart failure: I50.21

## 2020-07-18 LAB — LIPID PANEL
Cholesterol: 133 mg/dL (ref 0–200)
HDL: 43 mg/dL (ref >40)
LDL Cholesterol: 79 mg/dL (ref 0–99)
Total CHOL/HDL Ratio: 3.1 RATIO
Triglycerides: 54 mg/dL (ref <150)
VLDL: 11 mg/dL (ref 0–40)

## 2020-07-18 LAB — BRAIN NATRIURETIC PEPTIDE: B Natriuretic Peptide: 1334 pg/mL — ABNORMAL HIGH (ref 0.0–100.0)

## 2020-07-18 LAB — BASIC METABOLIC PANEL
Anion gap: 11 (ref 5–15)
BUN: 37 mg/dL — ABNORMAL HIGH (ref 6–20)
CO2: 26 mmol/L (ref 22–32)
Calcium: 8.8 mg/dL — ABNORMAL LOW (ref 8.9–10.3)
Chloride: 105 mmol/L (ref 98–111)
Creatinine, Ser: 1.64 mg/dL — ABNORMAL HIGH (ref 0.61–1.24)
GFR, Estimated: 50 mL/min — ABNORMAL LOW (ref >60)
Glucose, Bld: 96 mg/dL (ref 70–99)
Potassium: 4 mmol/L (ref 3.5–5.1)
Sodium: 142 mmol/L (ref 135–145)

## 2020-07-18 LAB — MAGNESIUM: Magnesium: 2 mg/dL (ref 1.7–2.4)

## 2020-07-18 MED ORDER — SODIUM CHLORIDE 0.9% FLUSH
3.0000 mL | Freq: Two times a day (BID) | INTRAVENOUS | Status: DC
  Administered 2020-07-18 – 2020-07-21 (×6): 3 mL via INTRAVENOUS

## 2020-07-18 MED ORDER — SODIUM CHLORIDE 0.9% FLUSH: 3.0000 mL | INTRAVENOUS | Status: DC | PRN

## 2020-07-18 MED ORDER — ASPIRIN 81 MG PO CHEW
81.0000 mg | CHEWABLE_TABLET | ORAL | Status: AC
  Administered 2020-07-19: 81 mg via ORAL
  Filled 2020-07-18: qty 1

## 2020-07-18 MED ORDER — SODIUM CHLORIDE 0.9 % IV SOLN: 250.0000 mL | INTRAVENOUS | Status: DC | PRN

## 2020-07-18 MED ORDER — SODIUM CHLORIDE 0.9 % IV SOLN
INTRAVENOUS | Status: DC
Start: 2020-07-19 — End: 2020-07-19

## 2020-07-18 MED ORDER — FUROSEMIDE 10 MG/ML IJ SOLN
40.0000 mg | Freq: Two times a day (BID) | INTRAMUSCULAR | Status: DC
  Administered 2020-07-18 – 2020-07-21 (×6): 40 mg via INTRAVENOUS
  Filled 2020-07-18 (×5): qty 4

## 2020-07-18 MED ORDER — HYDRALAZINE HCL 25 MG PO TABS
12.5000 mg | ORAL_TABLET | Freq: Three times a day (TID) | ORAL | Status: DC
  Administered 2020-07-18 – 2020-07-19 (×3): 12.5 mg via ORAL
  Filled 2020-07-18 (×3): qty 1

## 2020-07-18 MED ORDER — FUROSEMIDE 10 MG/ML IJ SOLN
60.0000 mg | Freq: Once | INTRAMUSCULAR | Status: AC
  Administered 2020-07-18: 60 mg via INTRAVENOUS
  Filled 2020-07-18: qty 6

## 2020-07-18 MED ORDER — ISOSORBIDE MONONITRATE ER 30 MG PO TB24
15.0000 mg | ORAL_TABLET | Freq: Every day | ORAL | Status: DC
  Administered 2020-07-18: 15 mg via ORAL
  Filled 2020-07-18: qty 1

## 2020-07-18 NOTE — Progress Notes (Signed)
Patient transferred to [facility] for LHC/ RHC tomorrow. Alert and oriented x's 4. Report called and given to Karie Fetch RN. [facility] to transport patient to awaiting facility. VSS. No c/o pain or discomfort noted.

## 2020-07-18 NOTE — Progress Notes (Signed)
PROGRESS NOTE   Tony Meyer  AYT:[id number] DOB: 1971/01/19 DOA: 07/13/2020 PCP: Patient, No Pcp Per (Inactive)   Chief Complaint  Patient presents with  . Shortness of Breath   Level of care: Telemetry Cardiac  Brief Admission History:  50 y.o. male, with history of admission for Covid in December, hypertension, presents to the ED with a chief complaint of shortness of breath.  Patient reports that he has been short of breath since his discharge in December 2021.  He reports he stopped taking his medications because he thought that they were making him short of breath.  Early last 3 weeks he has had an acute change with worsening shortness of breath, and tightness in his abdomen.  Over the last week he has had increased peripheral edema.  Patient reports that he has had an increase in fatigue with exertion.  He denies any chest pain, palpitations, nausea, vomiting.  He does report a decrease in urinary output over the past few days.  He attributes this to the fact that he has been drinking less water because he has been trying to prevent the swelling.  Patient reports no dysuria or hematuria.  Patient has no history of CHF.  He does not follow any diet, and reports that he definitely does not follow a low-sodium diet.  Patient does not check his blood pressure at home, in the setting of not taking his medications his blood pressure on admission was 135/101.  Patient has no other complaints at this time.  Patient does not smoke, does not drink, does not use illicit drugs.  Patient is not vaccinated for COVID.  Patient is full code.    Assessment & Plan:   Active Problems:   Essential hypertension   AKI (acute kidney injury) (HCC)   CHF exacerbation (HCC)   Elevated troponin   Acute heart failure (HCC)  1. 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   He is diuresing well on IV lasix.  2D echocardiogram reports EF 20-25% severe LV dysfunction and also has severe global hypokinesis and some right heart failure.  Will consult inpatient cardiology service to see on 4/4. TSH WNL.   He is now on IV lasix per cardiology service.  HE IS TRANSFERRING TO [facility] ON CARDIOLOGY SERVICE FOR CATH.  2. Suspected stage 2 CKD - likely from poorly controlled hypertension vs cardiorenal.  Follow creatinine with diuresis. renal US unremarkable.  3. Elevated troponin - likely from demand ischemia associated with acute systolic heart failure, do not suspect ACS.  4. Social: Pt wants to apply for disability, he has spoke with Child psychotherapist and encouraged to apply thru social security office.    DVT prophylaxis: SCDs Code Status: Full  Family Communication: counseled patient at bedside, verbalized understanding Disposition: Home  Status is: Inpatient   The patient will require care spanning > 2 midnights and should be moved to inpatient because: Ongoing diagnostic testing needed not appropriate for outpatient work up, IV treatments appropriate due to intensity of illness or inability to take PO and Inpatient level of care appropriate due to severity of illness  Dispo: The patient is from: Home              Anticipated d/c is to: Home              Patient currently is not medically  stable to d/c.   Difficult to place patient No  Consultants:   n/a  Procedures:   2D echo 07/14/20  Tentative cardiac cath  Antimicrobials:  n/a   Subjective: He has been ambulating in the room, no CP, no SOB.  He is urinating frequently.    Objective: Vitals:   07/18/20 0438 07/18/20 0500 07/18/20 1012 07/18/20 1411  BP: (!) 135/95  (!) 142/99 126/78  Pulse: 96  93 91  Resp: 20   18  Temp: 98.6 F (37 C)   98.2 F (36.8 C)  TempSrc: Oral   Oral  SpO2: 99%   100%  Weight:  107.5 kg    Height:        Intake/Output Summary (Last 24 hours) at 07/18/2020 1422 Last data  filed at 07/17/2020 1811 Gross per 24 hour  Intake 480 ml  Output --  Net 480 ml   Filed Weights   07/17/20 0509 07/17/20 0600 07/18/20 0500  Weight: 106.3 kg 105 kg 107.5 kg    Examination:  General exam: Awake, alert, cooperative, Appears calm and comfortable  Respiratory system: Clear to auscultation. Respiratory effort normal. Cardiovascular system: normal S1 & S2 heard. No JVD, murmurs, rubs, gallops or clicks. No pedal edema. Gastrointestinal system: Abdomen is nondistended, soft and nontender. No organomegaly or masses felt. Normal bowel sounds heard. Central nervous system: Alert and oriented. No focal neurological deficits. Extremities: trace edema BLEs Symmetric 5 x 5 power.  Ted Hoses on.   Skin: No rashes, lesions or ulcers Psychiatry: Judgement and insight appear normal. Mood & affect appropriate.   Data Reviewed: I have personally reviewed following labs and imaging studies  CBC: Recent Labs  Lab 07/13/20 2020 07/14/20 0443  WBC 8.3 9.3  NEUTROABS 5.6 6.7  HGB 13.2 13.0  HCT 42.5 41.5  MCV 88.5 89.2  PLT 227 223    Basic Metabolic Panel: Recent Labs  Lab 07/14/20 0443 07/15/20 0549 07/16/20 0559 07/17/20 0456 07/18/20 0442  NA 138 139 144 143 142  K 4.3 3.8 3.8 3.5 4.0  CL 103 104 105 106 105  CO2 26 23 29 26 26   GLUCOSE 127* 111* 99 160* 96  BUN 20 29* 31* 36* 37*  CREATININE 1.57* 1.60* 1.61* 1.68* 1.64*  CALCIUM 8.6* 8.7* 8.7* 8.5* 8.8*  MG 2.0 1.9 1.9 1.9 2.0    GFR: Estimated Creatinine Clearance: 69.6 mL/min (A) (by C-G formula based on SCr of 1.64 mg/dL (H)).  Liver Function Tests: Recent Labs  Lab 07/13/20 2020 07/14/20 0443  AST 44* 39  ALT 70* 62*  ALKPHOS 63 56  BILITOT 1.0 1.1  PROT 6.1* 5.6*  ALBUMIN 2.9* 2.7*    CBG: No results for input(s): GLUCAP in the last 168 hours.  Recent Results (from the past 240 hour(s))  Resp Panel by RT-PCR (Flu A&B, Covid) Nasopharyngeal Swab     Status: None   Collection Time:  07/13/20  8:03 PM   Specimen: Nasopharyngeal Swab; Nasopharyngeal(NP) swabs in vial transport medium  Result Value Ref Range Status   SARS Coronavirus 2 by RT PCR NEGATIVE NEGATIVE Final    Comment: (NOTE) SARS-CoV-2 target nucleic acids are NOT DETECTED.  The SARS-CoV-2 RNA is generally detectable in upper respiratory specimens during the acute phase of infection. The lowest concentration of SARS-CoV-2 viral copies this assay can detect is 138 copies/mL. A negative result does not preclude SARS-Cov-2 infection and should not be used as the sole basis for treatment or other patient management decisions.  A negative result may occur with  improper specimen collection/handling, submission of specimen other than nasopharyngeal swab, presence of viral mutation(s) within the areas targeted by this assay, and inadequate number of viral copies(<138 copies/mL). A negative result must be combined with clinical observations, patient history, and epidemiological information. The expected result is Negative.  Fact Sheet for Patients:  BloggerCourse.com  Fact Sheet for Healthcare Providers:  SeriousBroker.it  This test is no t yet approved or cleared by the [place] FDA and  has been authorized for detection and/or diagnosis of SARS-CoV-2 by FDA under an Emergency Use Authorization (EUA). This EUA will remain  in effect (meaning this test can be used) for the duration of the COVID-19 declaration under Section 564(b)(1) of the Act, 21 U.S.C.section 360bbb-3(b)(1), unless the authorization is terminated  or revoked sooner.       Influenza A by PCR NEGATIVE NEGATIVE Final   Influenza B by PCR NEGATIVE NEGATIVE Final    Comment: (NOTE) The Xpert Xpress SARS-CoV-2/FLU/RSV plus assay is intended as an aid in the diagnosis of influenza from Nasopharyngeal swab specimens and should not be used as a sole basis for treatment. Nasal washings  and aspirates are unacceptable for Xpert Xpress SARS-CoV-2/FLU/RSV testing.  Fact Sheet for Patients: BloggerCourse.com  Fact Sheet for Healthcare Providers: SeriousBroker.it  This test is not yet approved or cleared by the [place] FDA and has been authorized for detection and/or diagnosis of SARS-CoV-2 by FDA under an Emergency Use Authorization (EUA). This EUA will remain in effect (meaning this test can be used) for the duration of the COVID-19 declaration under Section 564(b)(1) of the Act, 21 U.S.C. section 360bbb-3(b)(1), unless the authorization is terminated or revoked.  Performed at [facility], [address], [place], [place] [address]      Radiology Studies: DG CHEST PORT 1 VIEW  Result Date: 07/17/2020 CLINICAL DATA:  50 year old male with increased shortness of breath and bilateral lower extremity edema EXAM: PORTABLE CHEST 1 VIEW COMPARISON:  Prior chest x-ray 07/13/2020 FINDINGS: Stable enlargement of the cardiopericardial silhouette. Pulmonary vascular congestion is present without overt edema. No pleural effusion or pneumothorax. No focal airspace opacity. No acute osseous abnormality. IMPRESSION: Stable cardiomegaly and pulmonary vascular congestion without overt pulmonary edema. Electronically Signed   By: Malachy Moan M.D.   On: 07/17/2020 08:06   Scheduled Meds: . heparin  5,000 Units Subcutaneous Q8H  . hydrALAZINE  12.5 mg Oral Q8H  . isosorbide mononitrate  15 mg Oral Daily  . metoprolol succinate  50 mg Oral Daily  . potassium chloride  10 mEq Oral Daily  . spironolactone  12.5 mg Oral Daily   Continuous Infusions:   LOS: 4 days   Time spent: 35 mins   Renada Cronin Laural Benes, MD How to contact the [facility] Attending or Consulting provider 7A - 7P or covering provider during after hours 7P -7A, for this patient?  1. Check the care team in [facility] and look for a) attending/consulting [facility] provider listed and  b) the [facility] team listed 2. Log into [email or web address] and use 's universal password to access. If you do not have the password, please contact the hospital operator. 3. Locate the [facility] provider you are looking for under [facility] and page to a number that you can be directly reached. 4. If you still have difficulty reaching the provider, please page the [facility] (Director on Call) for the Hospitalists listed on amion for assistance.  07/18/2020, 2:22 PM

## 2020-07-18 NOTE — H&P (View-Only) (Signed)
  Advanced Heart Failure Team Consult Note   Primary Physician: Patient, No Pcp Per (Inactive) PCP-Cardiologist:  Branch, Jonathan, MD  Reason for Consultation: Acute Systolic Heart Failure   HPI:    Tony Meyer is seen today for evaluation new systolic heart failure at the request of Dr. Branch, Cardiology.   49 y/o AAM w/ h/o COVID 19 infection 03/2020, HTN and chronic renal insuffiency who presented to [facility] w/ complaints of progressive dyspnea and peripheral edema. Dyspnea started shortly after being diagnosed w/ COVID and has progressively gotten worse, more so in the last 3 weeks.   At [facility] he was found to be in acute CHF. BNP 1510. CXR showed cardiomegaly and pulmonary edema. BP elevated 135/101. EKG sinus tach 116 bpm. HS trop 64>>60. COVID and Flu negative. BMP showed AKI w/ SCr at 1.6 (baseline ~1.3). K 4.1. Hepatic enzymes mildly elevated, AST 44.  ALT 70. TSH normal. LDL 79  2D echo showed severely reduced LVEF 20-25% w/ global HK, mild LVH, RV moderately enlarged and systolic function mildly reduced. No significant valvular disease. Only mild MR and mild TR. RVSP normal at 23 mmHg.   He was admitted and started on IV Lasix, spiro, Toprol and Imdur + hydralazine. BP improved. Net Is/os negative 3.6L since admit. Wt is down 13 lb. Renal function has remained stable at 1.6.   He has now been transferred to [facility] for R/LHC and AHF consultation. Remains fluid overloaded, no resting dyspnea. NYHA Class III. Denies CP. No family h/o heart disease. Denies tobacco and ETOH use. Has been told he snores but has never had sleep study.    Echo 07/14/20  1. Left ventricular ejection fraction, by estimation, is 20 to 25%. The left ventricle has severely decreased function. The left ventricle demonstrates global hypokinesis. The left ventricular internal cavity size was mildly dilated. There is mild left ventricular hypertrophy. Left ventricular diastolic parameters are indeterminate. 2.  Right ventricular systolic function is mildly reduced. The right ventricular size is moderately enlarged. There is normal pulmonary artery systolic pressure. 3. Left atrial size was mildly dilated. 4. Right atrial size was mildly dilated. 5. The mitral valve is abnormal. Mild mitral valve regurgitation. No evidence of mitral stenosis. 6. The aortic valve is tricuspid. There is mild calcification of the aortic valve. There is mild thickening of the aortic valve. Aortic valve regurgitation is not visualized. No aortic stenosis is present. 7. The inferior vena cava is dilated in size with <50% respiratory variability, suggesting right atrial pressure of 15 mmHg.   Review of Systems: [y] = yes, [ ] = no   . General: Weight gain [ ]; Weight loss [ ]; Anorexia [ ]; Fatigue [ ]; Fever [ ]; Chills [ ]; Weakness [ ]  . Cardiac: Chest pain/pressure [ ]; Resting SOB [Y ]; Exertional SOB [ Y]; Orthopnea [y]; Pedal Edema [ Y]; Palpitations [ ]; Syncope [ ]; Presyncope [ ]; Paroxysmal nocturnal dyspnea[ ]  . Pulmonary: Cough [ ]; Wheezing[ ]; Hemoptysis[ ]; Sputum [ ]; Snoring [ ]  . GI: Vomiting[ ]; Dysphagia[ ]; Melena[ ]; Hematochezia [ ]; Heartburn[ ]; Abdominal pain [ ]; Constipation [ ]; Diarrhea [ ]; BRBPR [ ]  . GU: Hematuria[ ]; Dysuria [ ]; Nocturia[ ]  . Vascular: Pain in legs with walking [ ]; Pain in feet with lying flat [ ]; Non-healing sores [ ]; Stroke [ ]; TIA [ ]; Slurred speech [ ];  . Neuro: Headaches[ ]; Vertigo[ ];   Seizures[ ] ; Paresthesias[ ] ;Blurred vision [ ] ; Diplopia [ ] ; Vision changes [ ]   . Ortho/Skin: Arthritis [ ] ; Joint pain [ ] ; Muscle pain [ ] ; Joint swelling [ ] ; Back Pain [ ] ; Rash [ ]   . Psych: Depression[ ] ; Anxiety[ ]   . Heme: Bleeding problems [ ] ; Clotting disorders [ ] ; Anemia [ ]   . Endocrine: Diabetes [ ] ; Thyroid dysfunction[ ]   Home Medications Prior to Admission medications   Medication Sig Start Date End Date Taking? Authorizing Provider  albuterol  (VENTOLIN HFA) 108 (90 Base) MCG/ACT inhaler Inhale 2 puffs into the lungs every 4 (four) hours as needed for wheezing or shortness of breath. 03/17/20  Yes Johnson, Clanford L, MD  amLODipine (NORVASC) 5 MG tablet Take 1 tablet (5 mg total) by mouth daily. 03/18/20 04/17/20  Johnson, Clanford L, MD  ascorbic acid (VITAMIN C) 500 MG tablet Take 1 tablet (500 mg total) by mouth daily. 03/18/20   Johnson, Clanford L, MD  guaiFENesin-dextromethorphan (ROBITUSSIN DM) 100-10 MG/5ML syrup Take 10 mLs by mouth every 4 (four) hours as needed for cough. 03/17/20   Johnson, Clanford L, MD  metoprolol tartrate (LOPRESSOR) 50 MG tablet Take 1 tablet (50 mg total) by mouth 2 (two) times daily. Patient not taking: No sig reported 03/17/20   , MD    Past Medical History: Past Medical History:  Diagnosis Date  . COVID-19 virus infection 03/2020  . Hypertension   . Renal insufficiency   . Systolic heart failure (HCC) 06/2020    Past Surgical History: Past Surgical History:  Procedure Laterality Date  . HERNIA REPAIR      Family History: Family History  Problem Relation Age of Onset  . Heart failure Neg Hx     Social History: Social History   Socioeconomic History  . Marital status: Single    Spouse name: Not on file  . Number of children: Not on file  . Years of education: Not on file  . Highest education level: Not on file  Occupational History  . Not on file  Tobacco Use  . Smoking status: Never Smoker  . Smokeless tobacco: Never Used  Vaping Use  . Vaping Use: Never used  Substance and Sexual Activity  . Alcohol use: Never  . Drug use: Never  . Sexual activity: Not on file  Other Topics Concern  . Not on file  Social History Narrative  . Not on file   Social Determinants of Health   Financial Resource Strain: Not on file  Food Insecurity: Not on file  Transportation Needs: Not on file  Physical Activity: Not on file  Stress: Not on file  Social Connections:  Not on file    Allergies:  No Known Allergies  Objective:    Vital Signs:   Temp:  [97.3 F (36.3 C)-98.6 F (37 C)] 98.2 F (36.8 C) (04/05 1411) Pulse Rate:  [88-96] 91 (04/05 1411) Resp:  [18-20] 18 (04/05 1411) BP: (126-142)/(78-99) 126/78 (04/05 1411) SpO2:  [97 %-100 %] 100 % (04/05 1411) Weight:  [107.5 kg] 107.5 kg (04/05 0500) Last BM Date: 07/18/20  Weight change: Filed Weights   07/17/20 0509 07/17/20 0600 07/18/20 0500  Weight: 106.3 kg 105 kg 107.5 kg    Intake/Output:   Intake/Output Summary (Last 24 hours) at 07/18/2020 1545 Last data filed at 07/18/2020 1400 Gross per 24 hour  Intake 480 ml  Output 1000 ml  Net -520 ml      Physical Exam  General:  Well appearing. No resp difficulty HEENT: normal Neck: supple. JVP 10 cm . Carotids 2+ bilat; no bruits. No lymphadenopathy or thyromegaly appreciated. Cor: PMI nondisplaced. Regular rate & rhythm. No rubs, gallops or murmurs. Lungs: decreased BS at the bases bilaterally  Abdomen: soft, nontender, nondistended. No hepatosplenomegaly. No bruits or masses. Good bowel sounds. Extremities: no cyanosis, clubbing, rash, 1+ bilateral pretibial edema, ted hoses  Neuro: alert & orientedx3, cranial nerves grossly intact. moves all 4 extremities w/o difficulty. Affect pleasant   Telemetry   NSR 90s   EKG   Admit EKG>>sinus tach 116 bpm, RAE, no ST abnormalities   Labs   Basic Metabolic Panel: Recent Labs  Lab 07/14/20 0443 07/15/20 0549 07/16/20 0559 07/17/20 0456 07/18/20 0442  NA 138 139 144 143 142  K 4.3 3.8 3.8 3.5 4.0  CL 103 104 105 106 105  CO2 26 23 29 26 26   GLUCOSE 127* 111* 99 160* 96  BUN 20 29* 31* 36* 37*  CREATININE 1.57* 1.60* 1.61* 1.68* 1.64*  CALCIUM 8.6* 8.7* 8.7* 8.5* 8.8*  MG 2.0 1.9 1.9 1.9 2.0    Liver Function Tests: Recent Labs  Lab 07/13/20 2020 07/14/20 0443  AST 44* 39  ALT 70* 62*  ALKPHOS 63 56  BILITOT 1.0 1.1  PROT 6.1* 5.6*  ALBUMIN 2.9* 2.7*    No results for input(s): LIPASE, AMYLASE in the last 168 hours. No results for input(s): AMMONIA in the last 168 hours.  CBC: Recent Labs  Lab 07/13/20 2020 07/14/20 0443  WBC 8.3 9.3  NEUTROABS 5.6 6.7  HGB 13.2 13.0  HCT 42.5 41.5  MCV 88.5 89.2  PLT 227 223    Cardiac Enzymes: No results for input(s): CKTOTAL, CKMB, CKMBINDEX, TROPONINI in the last 168 hours.  BNP: BNP (last 3 results) Recent Labs    07/16/20 0559 07/17/20 0456 07/18/20 0442  BNP 1,244.0* 1,178.0* 1,334.0*    ProBNP (last 3 results) No results for input(s): PROBNP in the last 8760 hours.   CBG: No results for input(s): GLUCAP in the last 168 hours.  Coagulation Studies: No results for input(s): LABPROT, INR in the last 72 hours.   Imaging   No results found.   Medications:     Current Medications: . heparin  5,000 Units Subcutaneous Q8H  . hydrALAZINE  12.5 mg Oral Q8H  . isosorbide mononitrate  15 mg Oral Daily  . metoprolol succinate  50 mg Oral Daily  . potassium chloride  10 mEq Oral Daily  . spironolactone  12.5 mg Oral Daily    Infusions:    Assessment/Plan   1. Acute Systolic Heart Failure - new diagnosis, Echo LVEF 20-25%, global HK, RV mildly reduced  - HS trop low level and flat not c/w ACS - needs R/LHC to exclude coronary disease - ? Viral CM given h/o COVID 19 infection 12/21 - plan cMRI if no significant coronary disease on cath  - remains fluid overloaded, NYHA Class III - Continue IV Lasix 40 mg bid  - GDMT w/ gradual titration  - Continue Spironolactone 12.5 mg  - Continue Hydral + nitrate - Plan SGLT2i prior to d/c (check Hgb A1c) - Eventual ARNi if renal function remains stable  - may need to lower Toprol dose, pending hemodynamics on RHC   2. HTN:  - Elevated on admit, ? If cause of CM - better control w/ medications - continue to optimize HF regimen - h/o snoring. Will need outpatient sleep study to r/o  OSA   3. Renal Insuffiencey -  Baseline SCr ~1.3. 1.6 on admit and has remained stable w/ diuresis  - ? Cardiorenal syndrome. Plan RHC to assess output - follow daily BMPs  - eventual SGLT2i      Length of Stay: [address], PA-C  07/18/2020, 3:45 PM  Advanced Heart Failure Team Pager [phone] (M-F; 7a - 5p)  Please contact [facility] for night-coverage after hours (4p -7a ) and weekends on [email or web address]  Patient seen and examined with the above-signed Advanced Practice Provider and/or Housestaff. I personally reviewed laboratory data, imaging studies and relevant notes. I independently examined the patient and formulated the important aspects of the plan. I have edited the note to reflect any of my changes or salient points. I have personally discussed the plan with the patient and/or family.  50 y/o male with CKD IIIa, HTN and recent COVID infection admitted with new onset biventricular systolic failure.   Denies CP. No FHx of HF. No other clear RFs  General:  Lying in bed . No resp difficulty HEENT: normal Neck: supple. JVP to jaw Carotids 2+ bilat; no bruits. No lymphadenopathy or thryomegaly appreciated. Cor: PMI nondisplaced. Tachy regular Lungs: clear Abdomen: soft, nontender, nondistended. No hepatosplenomegaly. No bruits or masses. Good bowel sounds. Extremities: no cyanosis, clubbing, rash, 1+ edema Neuro: alert & orientedx3, cranial nerves grossly intact. moves all 4 extremities w/o difficulty. Affect pleasant  Etiology of cardiomyopathy remains unclear. ? HTN vs viral. TTR amyloid less likely. Continue diuresis. Plan R/L cath tomorrow and possible cMRI. Titrate GDMT. Hopefully can switch to [facility] after cath.   Arvilla Meres, MD  4:32 PM

## 2020-07-18 NOTE — Progress Notes (Signed)
Progress Note  Patient Name: Tony Meyer Date of Encounter: 07/18/2020  [facility] Cardiologist: Dina Rich, MD  Subjective   Some ongoing SOB/DOE  Inpatient Medications    Scheduled Meds: . heparin  5,000 Units Subcutaneous Q8H  . metoprolol succinate  50 mg Oral Daily  . potassium chloride  10 mEq Oral Daily  . spironolactone  12.5 mg Oral Daily   Continuous Infusions:  PRN Meds: acetaminophen **OR** acetaminophen, albuterol, ondansetron **OR** ondansetron (ZOFRAN) IV, oxyCODONE, polyethylene glycol   Vital Signs    Vitals:   07/17/20 1331 07/17/20 2052 07/18/20 0438 07/18/20 0500  BP: 115/77 (!) 135/99 (!) 135/95   Pulse: 89 88 96   Resp: 20 19 20    Temp: 97.7 F (36.5 C) (!) 97.3 F (36.3 C) 98.6 F (37 C)   TempSrc: Oral Oral Oral   SpO2: 96% 97% 99%   Weight:    107.5 kg  Height:        Intake/Output Summary (Last 24 hours) at 07/18/2020 0900 Last data filed at 07/17/2020 1811 Gross per 24 hour  Intake 720 ml  Output 200 ml  Net 520 ml   Last 3 Weights 07/18/2020 07/17/2020 07/17/2020  Weight (lbs) 236 lb 15.9 oz 231 lb 8 oz 234 lb 5.6 oz  Weight (kg) 107.5 kg 105.008 kg 106.3 kg      Telemetry    SR - Personally Reviewed  ECG    n/a - Personally Reviewed  Physical Exam   GEN: No acute distress.   Neck: No JVD Cardiac: RRR, no murmurs, rubs, or gallops.  Respiratory: bilateral crackles bases GI: Soft, nontender, non-distended  MS: trace bilateral edema. Neuro:  Nonfocal  Psych: Normal affect   Labs    High Sensitivity Troponin:   Recent Labs  Lab 07/13/20 2020 07/13/20 2305  TROPONINIHS 64* 60*      Chemistry Recent Labs  Lab 07/13/20 2020 07/14/20 0443 07/15/20 0549 07/16/20 0559 07/17/20 0456 07/18/20 0442  NA 134* 138   < > 144 143 142  K 4.1 4.3   < > 3.8 3.5 4.0  CL 102 103   < > 105 106 105  CO2 23 26   < > 29 26 26   GLUCOSE 115* 127*   < > 99 160* 96  BUN 18 20   < > 31* 36* 37*  CREATININE 1.56*  1.57*   < > 1.61* 1.68* 1.64*  CALCIUM 8.6* 8.6*   < > 8.7* 8.5* 8.8*  PROT 6.1* 5.6*  --   --   --   --   ALBUMIN 2.9* 2.7*  --   --   --   --   AST 44* 39  --   --   --   --   ALT 70* 62*  --   --   --   --   ALKPHOS 63 56  --   --   --   --   BILITOT 1.0 1.1  --   --   --   --   GFRNONAA 53* 53*   < > 51* 49* 50*  ANIONGAP 9 9   < > 10 11 11    < > = values in this interval not displayed.     Hematology Recent Labs  Lab 07/13/20 2020 07/14/20 0443  WBC 8.3 9.3  RBC 4.80 4.65  HGB 13.2 13.0  HCT 42.5 41.5  MCV 88.5 89.2  MCH 27.5 28.0  MCHC 31.1 31.3  RDW 15.7* 15.8*  PLT 227 223    BNP Recent Labs  Lab 07/16/20 0559 07/17/20 0456 07/18/20 0442  BNP 1,244.0* 1,178.0* 1,334.0*     DDimer No results for input(s): DDIMER in the last 168 hours.   Radiology    US RENAL  Result Date: 07/16/2020 CLINICAL DATA:  Chronic renal disease.  Hypertension. EXAM: RENAL / URINARY TRACT ULTRASOUND COMPLETE COMPARISON:  None. FINDINGS: Right Kidney: Renal measurements: 11 x 4.7 x 6.3 cm = volume: 177 mL. Contains a 2.6 cm upper pole cyst. Left Kidney: Renal measurements: 11 x 6.8 x 5.3 cm = volume: 203 mL. Echogenicity within normal limits. No mass or hydronephrosis visualized. Bladder: Appears normal for degree of bladder distention. Other: None. IMPRESSION: 1. No significant abnormalities identified. There is a 2.6 cm upper pole cyst on the right of no significance. Electronically Signed   By: Gerome Sam III M.D   On: 07/16/2020 18:17   DG CHEST PORT 1 VIEW  Result Date: 07/17/2020 CLINICAL DATA:  50 year old male with increased shortness of breath and bilateral lower extremity edema EXAM: PORTABLE CHEST 1 VIEW COMPARISON:  Prior chest x-ray 07/13/2020 FINDINGS: Stable enlargement of the cardiopericardial silhouette. Pulmonary vascular congestion is present without overt edema. No pleural effusion or pneumothorax. No focal airspace opacity. No acute osseous abnormality. IMPRESSION:  Stable cardiomegaly and pulmonary vascular congestion without overt pulmonary edema. Electronically Signed   By: Malachy Moan M.D.   On: 07/17/2020 08:06    Cardiac Studies    Patient Profile     NINO AMANO is a 50 y.o. male with a hx of HTN who is being seen today for the evaluation of new CHF at the request of Dr. Laural Benes.  Assessment & Plan    1. Acute systolic HF - new diagnosis of HF this admission - Echo LVEF 20-25%, indet dd, mild RV dysfunction with mod RV enlargement, mild MR - CXR pulm edema, cardiomegaly. BNP 1510  - I/Os incomplete yesterday and during this admission. Weight appear inaccurate.  - BNP today 1334, CXR yesterday with pulm congestion without overt edema - yesterday dosed IV lasix 40mg  x 1 due to uptrend in Cr. Mild downtrend in Cr today  - medical therapy with toprol 50mg , aldactone 12.5mg  daily. Holding on ACE/ARB/ARNI given current renal function. Start hydral 12.5mg  tid and imdur 15 mg, pending renal function may transition to ACE/ARB/ARNI later during admission - denies [facility] or drug use, no family history of heart disease, history of HTN with mixed control at home. Will need ischemic evaluation this admission.   - would plan for RHC/LHC this admission pending renal function - redose IV lasix 60mg  x 1 today -plan for transfer to today, for [facility] tomorrow   For questions or updates, please contact [facility] Please consult [email or web address] for contact info under        Signed, , MD  07/18/2020, 9:00 AM

## 2020-07-18 NOTE — Progress Notes (Addendum)
Advanced Heart Failure Team Consult Note   Primary Physician: Patient, No Pcp Per (Inactive) PCP-Cardiologist:  Dina Rich, MD  Reason for Consultation: Acute Systolic Heart Failure   HPI:    Tony Meyer is seen today for evaluation new systolic heart failure at the request of Dr. Wyline Mood, Cardiology.   [name] w/ h/o COVID 19 infection 03/2020, HTN and chronic renal insuffiency who presented to [facility] w/ complaints of progressive dyspnea and peripheral edema. Dyspnea started shortly after being diagnosed w/ COVID and has progressively gotten worse, more so in the last 3 weeks.   At [facility] he was found to be in acute CHF. BNP 1510. CXR showed cardiomegaly and pulmonary edema. BP elevated 135/101. EKG sinus tach 116 bpm. HS trop 64>>60. COVID and Flu negative. BMP showed AKI w/ SCr at 1.6 (baseline ~1.3). K 4.1. Hepatic enzymes mildly elevated, AST 44.  ALT 70. TSH normal. LDL 79  2D echo showed severely reduced LVEF 20-25% w/ global HK, mild LVH, RV moderately enlarged and systolic function mildly reduced. No significant valvular disease. Only mild MR and mild TR. RVSP normal at 23 mmHg.   He was admitted and started on IV Lasix, spiro, Toprol and Imdur + hydralazine. BP improved. Net Is/os negative 3.6L since admit. Wt is down 13 lb. Renal function has remained stable at 1.6.   He has now been transferred to [facility] for [facility] and AHF consultation. Remains fluid overloaded, no resting dyspnea. NYHA Class III. Denies CP. No family h/o heart disease. Denies tobacco and ETOH use. Has been told he snores but has never had sleep study.    Echo 07/14/20  1. Left ventricular ejection fraction, by estimation, is 20 to 25%. The left ventricle has severely decreased function. The left ventricle demonstrates global hypokinesis. The left ventricular internal cavity size was mildly dilated. There is mild left ventricular hypertrophy. Left ventricular diastolic parameters are indeterminate. 2.  Right ventricular systolic function is mildly reduced. The right ventricular size is moderately enlarged. There is normal pulmonary artery systolic pressure. 3. Left atrial size was mildly dilated. 4. Right atrial size was mildly dilated. 5. The mitral valve is abnormal. Mild mitral valve regurgitation. No evidence of mitral stenosis. 6. The aortic valve is tricuspid. There is mild calcification of the aortic valve. There is mild thickening of the aortic valve. Aortic valve regurgitation is not visualized. No aortic stenosis is present. 7. The inferior vena cava is dilated in size with <50% respiratory variability, suggesting right atrial pressure of 15 mmHg.   Review of Systems: [y] = yes, [ ]  = no   . General: Weight gain [ ] ; Weight loss [ ] ; Anorexia [ ] ; Fatigue [ ] ; Fever [ ] ; Chills [ ] ; Weakness [ ]   . Cardiac: Chest pain/pressure [ ] ; Resting SOB [Y ]; Exertional SOB [ Y]; Orthopnea [y]; Pedal Edema [ Y]; Palpitations [ ] ; Syncope [ ] ; Presyncope [ ] ; Paroxysmal nocturnal dyspnea[ ]   . Pulmonary: Cough [ ] ; Wheezing[ ] ; Hemoptysis[ ] ; Sputum [ ] ; Snoring [ ]   . GI: Vomiting[ ] ; Dysphagia[ ] ; Melena[ ] ; Hematochezia [ ] ; Heartburn[ ] ; Abdominal pain [ ] ; Constipation [ ] ; Diarrhea [ ] ; BRBPR [ ]   . GU: Hematuria[ ] ; Dysuria [ ] ; Nocturia[ ]   . Vascular: Pain in legs with walking [ ] ; Pain in feet with lying flat [ ] ; Non-healing sores [ ] ; Stroke [ ] ; TIA [ ] ; Slurred speech [ ] ;  . Neuro: Headaches[ ] ; Vertigo[ ] ;  Seizures[ ] ; Paresthesias[ ] ;Blurred vision [ ] ; Diplopia [ ] ; Vision changes [ ]   . Ortho/Skin: Arthritis [ ] ; Joint pain [ ] ; Muscle pain [ ] ; Joint swelling [ ] ; Back Pain [ ] ; Rash [ ]   . Psych: Depression[ ] ; Anxiety[ ]   . Heme: Bleeding problems [ ] ; Clotting disorders [ ] ; Anemia [ ]   . Endocrine: Diabetes [ ] ; Thyroid dysfunction[ ]   Home Medications Prior to Admission medications   Medication Sig Start Date End Date Taking? Authorizing Provider  albuterol  (VENTOLIN HFA) 108 (90 Base) MCG/ACT inhaler Inhale 2 puffs into the lungs every 4 (four) hours as needed for wheezing or shortness of breath. 03/17/20  Yes Johnson, Clanford L, MD  amLODipine (NORVASC) 5 MG tablet Take 1 tablet (5 mg total) by mouth daily. 03/18/20 04/17/20  Johnson, Clanford L, MD  ascorbic acid (VITAMIN C) 500 MG tablet Take 1 tablet (500 mg total) by mouth daily. 03/18/20   Johnson, Clanford L, MD  guaiFENesin-dextromethorphan (ROBITUSSIN DM) 100-10 MG/5ML syrup Take 10 mLs by mouth every 4 (four) hours as needed for cough. 03/17/20   Johnson, Clanford L, MD  metoprolol tartrate (LOPRESSOR) 50 MG tablet Take 1 tablet (50 mg total) by mouth 2 (two) times daily. Patient not taking: No sig reported 03/17/20   , MD    Past Medical History: Past Medical History:  Diagnosis Date  . COVID-19 virus infection 03/2020  . Hypertension   . Renal insufficiency   . Systolic heart failure (HCC) 06/2020    Past Surgical History: Past Surgical History:  Procedure Laterality Date  . HERNIA REPAIR      Family History: Family History  Problem Relation Age of Onset  . Heart failure Neg Hx     Social History: Social History   Socioeconomic History  . Marital status: Single    Spouse name: Not on file  . Number of children: Not on file  . Years of education: Not on file  . Highest education level: Not on file  Occupational History  . Not on file  Tobacco Use  . Smoking status: Never Smoker  . Smokeless tobacco: Never Used  Vaping Use  . Vaping Use: Never used  Substance and Sexual Activity  . Alcohol use: Never  . Drug use: Never  . Sexual activity: Not on file  Other Topics Concern  . Not on file  Social History Narrative  . Not on file   Social Determinants of Health   Financial Resource Strain: Not on file  Food Insecurity: Not on file  Transportation Needs: Not on file  Physical Activity: Not on file  Stress: Not on file  Social Connections:  Not on file    Allergies:  No Known Allergies  Objective:    Vital Signs:   Temp:  [97.3 F (36.3 C)-98.6 F (37 C)] 98.2 F (36.8 C) (04/05 1411) Pulse Rate:  [88-96] 91 (04/05 1411) Resp:  [18-20] 18 (04/05 1411) BP: (126-142)/(78-99) 126/78 (04/05 1411) SpO2:  [97 %-100 %] 100 % (04/05 1411) Weight:  [107.5 kg] 107.5 kg (04/05 0500) Last BM Date: 07/18/20  Weight change: Filed Weights   07/17/20 0509 07/17/20 0600 07/18/20 0500  Weight: 106.3 kg 105 kg 107.5 kg    Intake/Output:   Intake/Output Summary (Last 24 hours) at 07/18/2020 1545 Last data filed at 07/18/2020 1400 Gross per 24 hour  Intake 480 ml  Output 1000 ml  Net -520 ml      Physical Exam  General:  Well appearing. No resp difficulty HEENT: normal Neck: supple. JVP 10 cm . Carotids 2+ bilat; no bruits. No lymphadenopathy or thyromegaly appreciated. Cor: PMI nondisplaced. Regular rate & rhythm. No rubs, gallops or murmurs. Lungs: decreased BS at the bases bilaterally  Abdomen: soft, nontender, nondistended. No hepatosplenomegaly. No bruits or masses. Good bowel sounds. Extremities: no cyanosis, clubbing, rash, 1+ bilateral pretibial edema, ted hoses  Neuro: alert & orientedx3, cranial nerves grossly intact. moves all 4 extremities w/o difficulty. Affect pleasant   Telemetry   NSR 90s   EKG   Admit EKG>>sinus tach 116 bpm, RAE, no ST abnormalities   Labs   Basic Metabolic Panel: Recent Labs  Lab 07/14/20 0443 07/15/20 0549 07/16/20 0559 07/17/20 0456 07/18/20 0442  NA 138 139 144 143 142  K 4.3 3.8 3.8 3.5 4.0  CL 103 104 105 106 105  CO2 26 23 29 26 26   GLUCOSE 127* 111* 99 160* 96  BUN 20 29* 31* 36* 37*  CREATININE 1.57* 1.60* 1.61* 1.68* 1.64*  CALCIUM 8.6* 8.7* 8.7* 8.5* 8.8*  MG 2.0 1.9 1.9 1.9 2.0    Liver Function Tests: Recent Labs  Lab 07/13/20 2020 07/14/20 0443  AST 44* 39  ALT 70* 62*  ALKPHOS 63 56  BILITOT 1.0 1.1  PROT 6.1* 5.6*  ALBUMIN 2.9* 2.7*    No results for input(s): LIPASE, AMYLASE in the last 168 hours. No results for input(s): AMMONIA in the last 168 hours.  CBC: Recent Labs  Lab 07/13/20 2020 07/14/20 0443  WBC 8.3 9.3  NEUTROABS 5.6 6.7  HGB 13.2 13.0  HCT 42.5 41.5  MCV 88.5 89.2  PLT 227 223    Cardiac Enzymes: No results for input(s): CKTOTAL, CKMB, CKMBINDEX, TROPONINI in the last 168 hours.  BNP: BNP (last 3 results) Recent Labs    07/16/20 0559 07/17/20 0456 07/18/20 0442  BNP 1,244.0* 1,178.0* 1,334.0*    ProBNP (last 3 results) No results for input(s): PROBNP in the last 8760 hours.   CBG: No results for input(s): GLUCAP in the last 168 hours.  Coagulation Studies: No results for input(s): LABPROT, INR in the last 72 hours.   Imaging   No results found.   Medications:     Current Medications: . heparin  5,000 Units Subcutaneous Q8H  . hydrALAZINE  12.5 mg Oral Q8H  . isosorbide mononitrate  15 mg Oral Daily  . metoprolol succinate  50 mg Oral Daily  . potassium chloride  10 mEq Oral Daily  . spironolactone  12.5 mg Oral Daily    Infusions:    Assessment/Plan   1. Acute Systolic Heart Failure - new diagnosis, Echo LVEF 20-25%, global HK, RV mildly reduced  - HS trop low level and flat not c/w ACS - needs R/LHC to exclude coronary disease - ? Viral CM given h/o COVID 19 infection 12/21 - plan cMRI if no significant coronary disease on cath  - remains fluid overloaded, NYHA Class III - Continue IV Lasix 40 mg bid  - GDMT w/ gradual titration  - Continue Spironolactone 12.5 mg  - Continue Hydral + nitrate - Plan SGLT2i prior to d/c (check Hgb A1c) - Eventual ARNi if renal function remains stable  - may need to lower Toprol dose, pending hemodynamics on RHC   2. HTN:  - Elevated on admit, ? If cause of CM - better control w/ medications - continue to optimize HF regimen - h/o snoring. Will need outpatient sleep study to r/o  OSA   3. Renal Insuffiencey -  Baseline SCr ~1.3. 1.6 on admit and has remained stable w/ diuresis  - ? Cardiorenal syndrome. Plan RHC to assess output - follow daily BMPs  - eventual SGLT2i      Length of Stay: [address], PA-C  07/18/2020, 3:45 PM  Advanced Heart Failure Team Pager [phone] (M-F; 7a - 5p)  Please contact [facility] for night-coverage after hours (4p -7a ) and weekends on [email or web address]  Patient seen and examined with the above-signed Advanced Practice Provider and/or Housestaff. I personally reviewed laboratory data, imaging studies and relevant notes. I independently examined the patient and formulated the important aspects of the plan. I have edited the note to reflect any of my changes or salient points. I have personally discussed the plan with the patient and/or family.  50 y/o male with CKD IIIa, HTN and recent COVID infection admitted with new onset biventricular systolic failure.   Denies CP. No FHx of HF. No other clear RFs  General:  Lying in bed . No resp difficulty HEENT: normal Neck: supple. JVP to jaw Carotids 2+ bilat; no bruits. No lymphadenopathy or thryomegaly appreciated. Cor: PMI nondisplaced. Tachy regular Lungs: clear Abdomen: soft, nontender, nondistended. No hepatosplenomegaly. No bruits or masses. Good bowel sounds. Extremities: no cyanosis, clubbing, rash, 1+ edema Neuro: alert & orientedx3, cranial nerves grossly intact. moves all 4 extremities w/o difficulty. Affect pleasant  Etiology of cardiomyopathy remains unclear. ? HTN vs viral. TTR amyloid less likely. Continue diuresis. Plan R/L cath tomorrow and possible cMRI. Titrate GDMT. Hopefully can switch to [facility] after cath.   Arvilla Meres, MD  4:32 PM

## 2020-07-19 ENCOUNTER — Other Ambulatory Visit (HOSPITAL_COMMUNITY): Payer: Self-pay

## 2020-07-19 ENCOUNTER — Encounter (HOSPITAL_COMMUNITY)
Admission: EM | Disposition: A | Discharge status: Home / Self Care | Payer: Self-pay | Source: Home / Self Care | Attending: Family Medicine

## 2020-07-19 DIAGNOSIS — N189 Chronic kidney disease, unspecified: Secondary | ICD-10-CM | POA: Diagnosis not present

## 2020-07-19 DIAGNOSIS — R778 Other specified abnormalities of plasma proteins: Secondary | ICD-10-CM | POA: Diagnosis not present

## 2020-07-19 DIAGNOSIS — N179 Acute kidney failure, unspecified: Secondary | ICD-10-CM | POA: Diagnosis not present

## 2020-07-19 DIAGNOSIS — I5021 Acute systolic (congestive) heart failure: Secondary | ICD-10-CM | POA: Diagnosis not present

## 2020-07-19 DIAGNOSIS — I509 Heart failure, unspecified: Secondary | ICD-10-CM | POA: Diagnosis not present

## 2020-07-19 HISTORY — PX: RIGHT/LEFT HEART CATH AND CORONARY ANGIOGRAPHY: CATH118266

## 2020-07-19 LAB — POCT I-STAT EG7
Acid-Base Excess: 3 mmol/L — ABNORMAL HIGH (ref 0.0–2.0)
Acid-Base Excess: 4 mmol/L — ABNORMAL HIGH (ref 0.0–2.0)
Acid-Base Excess: 5 mmol/L — ABNORMAL HIGH (ref 0.0–2.0)
Bicarbonate: 28.8 mmol/L — ABNORMAL HIGH (ref 20.0–28.0)
Bicarbonate: 30 mmol/L — ABNORMAL HIGH (ref 20.0–28.0)
Bicarbonate: 30.9 mmol/L — ABNORMAL HIGH (ref 20.0–28.0)
Calcium, Ion: 1.08 mmol/L — ABNORMAL LOW (ref 1.15–1.40)
Calcium, Ion: 1.12 mmol/L — ABNORMAL LOW (ref 1.15–1.40)
Calcium, Ion: 1.23 mmol/L (ref 1.15–1.40)
HCT: 37 % — ABNORMAL LOW (ref 39.0–52.0)
HCT: 37 % — ABNORMAL LOW (ref 39.0–52.0)
HCT: 39 % (ref 39.0–52.0)
Hemoglobin: 12.6 g/dL — ABNORMAL LOW (ref 13.0–17.0)
Hemoglobin: 12.6 g/dL — ABNORMAL LOW (ref 13.0–17.0)
Hemoglobin: 13.3 g/dL (ref 13.0–17.0)
O2 Saturation: 75 %
O2 Saturation: 75 %
O2 Saturation: 76 %
Potassium: 3.3 mmol/L — ABNORMAL LOW (ref 3.5–5.1)
Potassium: 3.4 mmol/L — ABNORMAL LOW (ref 3.5–5.1)
Potassium: 3.6 mmol/L (ref 3.5–5.1)
Sodium: 142 mmol/L (ref 135–145)
Sodium: 142 mmol/L (ref 135–145)
Sodium: 145 mmol/L (ref 135–145)
TCO2: 30 mmol/L (ref 22–32)
TCO2: 32 mmol/L (ref 22–32)
TCO2: 32 mmol/L (ref 22–32)
pCO2, Ven: 47.5 mmHg (ref 44.0–60.0)
pCO2, Ven: 50 mmHg (ref 44.0–60.0)
pCO2, Ven: 50.9 mmHg (ref 44.0–60.0)
pH, Ven: 7.378 (ref 7.250–7.430)
pH, Ven: 7.391 (ref 7.250–7.430)
pH, Ven: 7.399 (ref 7.250–7.430)
pO2, Ven: 41 mmHg (ref 32.0–45.0)
pO2, Ven: 42 mmHg (ref 32.0–45.0)
pO2, Ven: 42 mmHg (ref 32.0–45.0)

## 2020-07-19 LAB — POCT I-STAT 7, (LYTES, BLD GAS, ICA,H+H)
Acid-Base Excess: 4 mmol/L — ABNORMAL HIGH (ref 0.0–2.0)
Bicarbonate: 29.1 mmol/L — ABNORMAL HIGH (ref 20.0–28.0)
Calcium, Ion: 1.01 mmol/L — ABNORMAL LOW (ref 1.15–1.40)
HCT: 35 % — ABNORMAL LOW (ref 39.0–52.0)
Hemoglobin: 11.9 g/dL — ABNORMAL LOW (ref 13.0–17.0)
O2 Saturation: 98 %
Potassium: 3.2 mmol/L — ABNORMAL LOW (ref 3.5–5.1)
Sodium: 145 mmol/L (ref 135–145)
TCO2: 30 mmol/L (ref 22–32)
pCO2 arterial: 45.8 mmHg (ref 32.0–48.0)
pH, Arterial: 7.411 (ref 7.350–7.450)
pO2, Arterial: 109 mmHg — ABNORMAL HIGH (ref 83.0–108.0)

## 2020-07-19 LAB — BASIC METABOLIC PANEL
Anion gap: 6 (ref 5–15)
BUN: 25 mg/dL — ABNORMAL HIGH (ref 6–20)
CO2: 28 mmol/L (ref 22–32)
Calcium: 8.9 mg/dL (ref 8.9–10.3)
Chloride: 105 mmol/L (ref 98–111)
Creatinine, Ser: 1.56 mg/dL — ABNORMAL HIGH (ref 0.61–1.24)
GFR, Estimated: 53 mL/min — ABNORMAL LOW (ref >60)
Glucose, Bld: 98 mg/dL (ref 70–99)
Potassium: 3.6 mmol/L (ref 3.5–5.1)
Sodium: 139 mmol/L (ref 135–145)

## 2020-07-19 LAB — CBC
HCT: 39.8 % (ref 39.0–52.0)
Hemoglobin: 12.8 g/dL — ABNORMAL LOW (ref 13.0–17.0)
MCH: 27.7 pg (ref 26.0–34.0)
MCHC: 32.2 g/dL (ref 30.0–36.0)
MCV: 86.1 fL (ref 80.0–100.0)
Platelets: 157 10*3/uL (ref 150–400)
RBC: 4.62 MIL/uL (ref 4.22–5.81)
RDW: 15.5 % (ref 11.5–15.5)
WBC: 6.6 10*3/uL (ref 4.0–10.5)
nRBC: 0 % (ref 0.0–0.2)

## 2020-07-19 LAB — MAGNESIUM: Magnesium: 2 mg/dL (ref 1.7–2.4)

## 2020-07-19 SURGERY — RIGHT/LEFT HEART CATH AND CORONARY ANGIOGRAPHY
Anesthesia: LOCAL

## 2020-07-19 MED ORDER — ACETAMINOPHEN 325 MG PO TABS: 650.0000 mg | ORAL_TABLET | ORAL | Status: DC | PRN

## 2020-07-19 MED ORDER — IOHEXOL 350 MG/ML SOLN
INTRAVENOUS | Status: DC | PRN
  Administered 2020-07-19: 30 mL

## 2020-07-19 MED ORDER — MIDAZOLAM HCL 2 MG/2ML IJ SOLN
INTRAMUSCULAR | Status: AC
  Filled 2020-07-19: qty 2

## 2020-07-19 MED ORDER — SODIUM CHLORIDE 0.9% FLUSH
3.0000 mL | Freq: Two times a day (BID) | INTRAVENOUS | Status: DC
  Administered 2020-07-19 (×2): 3 mL via INTRAVENOUS

## 2020-07-19 MED ORDER — MIDAZOLAM HCL 2 MG/2ML IJ SOLN
INTRAMUSCULAR | Status: DC | PRN
  Administered 2020-07-19 (×2): 1 mg via INTRAVENOUS

## 2020-07-19 MED ORDER — VERAPAMIL HCL 2.5 MG/ML IV SOLN
INTRAVENOUS | Status: DC | PRN
  Administered 2020-07-19: 10 mL via INTRA_ARTERIAL

## 2020-07-19 MED ORDER — ENOXAPARIN SODIUM 40 MG/0.4ML ~~LOC~~ SOLN
40.0000 mg | SUBCUTANEOUS | Status: DC
  Administered 2020-07-20 – 2020-07-21 (×2): 40 mg via SUBCUTANEOUS
  Filled 2020-07-19 (×3): qty 0.4

## 2020-07-19 MED ORDER — SACUBITRIL-VALSARTAN 24-26 MG PO TABS
1.0000 | ORAL_TABLET | Freq: Two times a day (BID) | ORAL | Status: DC
  Administered 2020-07-19 – 2020-07-21 (×5): 1 via ORAL
  Filled 2020-07-19 (×5): qty 1

## 2020-07-19 MED ORDER — LIDOCAINE HCL (PF) 1 % IJ SOLN
INTRAMUSCULAR | Status: AC
  Filled 2020-07-19: qty 30

## 2020-07-19 MED ORDER — ONDANSETRON HCL 4 MG/2ML IJ SOLN: 4.0000 mg | Freq: Four times a day (QID) | INTRAMUSCULAR | Status: DC | PRN

## 2020-07-19 MED ORDER — HYDRALAZINE HCL 20 MG/ML IJ SOLN: 10.0000 mg | INTRAMUSCULAR | Status: AC | PRN

## 2020-07-19 MED ORDER — HEPARIN (PORCINE) IN NACL 1000-0.9 UT/500ML-% IV SOLN
INTRAVENOUS | Status: AC
  Filled 2020-07-19: qty 1000

## 2020-07-19 MED ORDER — SODIUM CHLORIDE 0.9% FLUSH: 3.0000 mL | INTRAVENOUS | Status: DC | PRN

## 2020-07-19 MED ORDER — HEPARIN (PORCINE) IN NACL 1000-0.9 UT/500ML-% IV SOLN
INTRAVENOUS | Status: DC | PRN
  Administered 2020-07-19 (×2): 500 mL

## 2020-07-19 MED ORDER — LIDOCAINE HCL (PF) 1 % IJ SOLN
INTRAMUSCULAR | Status: DC | PRN
  Administered 2020-07-19: 5 mL

## 2020-07-19 MED ORDER — FENTANYL CITRATE (PF) 100 MCG/2ML IJ SOLN
INTRAMUSCULAR | Status: DC | PRN
  Administered 2020-07-19 (×2): 25 ug via INTRAVENOUS

## 2020-07-19 MED ORDER — SODIUM CHLORIDE 0.9 % IV SOLN: 250.0000 mL | INTRAVENOUS | Status: DC | PRN

## 2020-07-19 MED ORDER — FENTANYL CITRATE (PF) 100 MCG/2ML IJ SOLN
INTRAMUSCULAR | Status: AC
  Filled 2020-07-19: qty 2

## 2020-07-19 MED ORDER — HEPARIN SODIUM (PORCINE) 1000 UNIT/ML IJ SOLN
INTRAMUSCULAR | Status: AC
  Filled 2020-07-19: qty 1

## 2020-07-19 MED ORDER — HEPARIN SODIUM (PORCINE) 1000 UNIT/ML IJ SOLN
INTRAMUSCULAR | Status: DC | PRN
  Administered 2020-07-19: 5000 [IU] via INTRAVENOUS

## 2020-07-19 MED ORDER — LABETALOL HCL 5 MG/ML IV SOLN: 10.0000 mg | INTRAVENOUS | Status: AC | PRN

## 2020-07-19 MED ORDER — VERAPAMIL HCL 2.5 MG/ML IV SOLN
INTRAVENOUS | Status: AC
  Filled 2020-07-19: qty 2

## 2020-07-19 MED ORDER — POTASSIUM CHLORIDE CRYS ER 20 MEQ PO TBCR
40.0000 meq | EXTENDED_RELEASE_TABLET | Freq: Once | ORAL | Status: AC
  Administered 2020-07-19: 40 meq via ORAL
  Filled 2020-07-19 (×2): qty 2

## 2020-07-19 SURGICAL SUPPLY — 11 items
CATH 5FR JL3.5 JR4 ANG PIG MP (CATHETERS) ×1 IMPLANT
CATH SWAN GANZ 7F STRAIGHT (CATHETERS) ×1 IMPLANT
DEVICE RAD COMP TR BAND LRG (VASCULAR PRODUCTS) ×1 IMPLANT
GLIDESHEATH SLEND SS 6F .021 (SHEATH) ×1 IMPLANT
GLIDESHEATH SLENDER 7FR .021G (SHEATH) ×1 IMPLANT
GUIDEWIRE .025 260CM (WIRE) ×1 IMPLANT
GUIDEWIRE INQWIRE 1.5J.035X260 (WIRE) IMPLANT
INQWIRE 1.5J .035X260CM (WIRE) ×2
KIT HEART LEFT (KITS) ×1 IMPLANT
PACK CARDIAC CATHETERIZATION (CUSTOM PROCEDURE TRAY) ×2 IMPLANT
TRANSDUCER W/STOPCOCK (MISCELLANEOUS) ×2 IMPLANT

## 2020-07-19 NOTE — Progress Notes (Signed)
Advanced Heart Failure Rounding Note   Subjective:    Diuresed over 4L overnight. Breathing better. No orthopnea or PND. No CP. Didn't sleep well.   Cath this am:  Ao = 103/79 (89) LV = 102/22 RA =  11 RV = 50/12 PA = 42/17 (26) PCW = 14 Fick cardiac output/index = 7.9/3.4 PVR = 1.5 WU SVR = 791 Ao sat = 98% PA sat = 75%, 76% SVC sat - 75%  Assessment:  1. Normal coronary arteries 2. Severe NICM EF 15% 3. Volume overload with preserved CO   Objective:   Weight Range:  Vital Signs:   Temp:  [97.5 F (36.4 C)-98.4 F (36.9 C)] 98.4 F (36.9 C) (04/06 0454) Pulse Rate:  [83-94] 84 (04/06 0913) Resp:  [5-26] 16 (04/06 0913) BP: (109-146)/(77-99) 127/80 (04/06 0913) SpO2:  [97 %-100 %] 97 % (04/06 0913) Weight:  [102.2 kg-103 kg] 102.2 kg (04/06 0500) Last BM Date: 07/18/20  Weight change: Filed Weights   07/18/20 1607 07/19/20 0013 07/19/20 0500  Weight: 103 kg 102.2 kg 102.2 kg    Intake/Output:   Intake/Output Summary (Last 24 hours) at 07/19/2020 0927 Last data filed at 07/18/2020 2238 Gross per 24 hour  Intake 126 ml  Output 4350 ml  Net -4224 ml     Physical Exam: General:  Well appearing. No resp difficulty HEENT: normal Neck: supple. JVP to jaw Carotids 2+ bilat; no bruits. No lymphadenopathy or thryomegaly appreciated. Cor: PMI nondisplaced. Regular rate & rhythm. No rubs, gallops or murmurs. Lungs: clear Abdomen: soft, nontender, nondistended. No hepatosplenomegaly. No bruits or masses. Good bowel sounds. Extremities: no cyanosis, clubbing, rash, trace-1+ edema Neuro: alert & orientedx3, cranial nerves grossly intact. moves all 4 extremities w/o difficulty. Affect pleasant   Telemetry: NSR 80s Personally reviewed  Labs: Basic Metabolic Panel: Recent Labs  Lab 07/15/20 0549 07/16/20 0559 07/17/20 0456 07/18/20 0442 07/19/20 0358  NA 139 144 143 142 139  K 3.8 3.8 3.5 4.0 3.6  CL 104 105 106 105 105  CO2 23 29 26 26 28    GLUCOSE 111* 99 160* 96 98  BUN 29* 31* 36* 37* 25*  CREATININE 1.60* 1.61* 1.68* 1.64* 1.56*  CALCIUM 8.7* 8.7* 8.5* 8.8* 8.9  MG 1.9 1.9 1.9 2.0 2.0    Liver Function Tests: Recent Labs  Lab 07/13/20 2020 07/14/20 0443  AST 44* 39  ALT 70* 62*  ALKPHOS 63 56  BILITOT 1.0 1.1  PROT 6.1* 5.6*  ALBUMIN 2.9* 2.7*   No results for input(s): LIPASE, AMYLASE in the last 168 hours. No results for input(s): AMMONIA in the last 168 hours.  CBC: Recent Labs  Lab 07/13/20 2020 07/14/20 0443  WBC 8.3 9.3  NEUTROABS 5.6 6.7  HGB 13.2 13.0  HCT 42.5 41.5  MCV 88.5 89.2  PLT 227 223    Cardiac Enzymes: No results for input(s): CKTOTAL, CKMB, CKMBINDEX, TROPONINI in the last 168 hours.  BNP: BNP (last 3 results) Recent Labs    07/16/20 0559 07/17/20 0456 07/18/20 0442  BNP 1,244.0* 1,178.0* 1,334.0*    ProBNP (last 3 results) No results for input(s): PROBNP in the last 8760 hours.    Other results:  Imaging: CARDIAC CATHETERIZATION  Result Date: 07/19/2020 Findings: Ao = 103/79 (89) LV = 102/22 RA =  11 RV = 50/12 PA = 42/17 (26) PCW = 14 Fick cardiac output/index = 7.9/3.4 PVR = 1.5 WU SVR = 791 Ao sat = 98% PA sat = 75%, 76%  SVC sat - 75% Assessment: 1. Normal coronary arteries 2. Severe NICM EF 15% 3. Volume overload with preserved CO Plan/Discussion: Severe NICM. Persistent volume overload. Titrate GDMT. Continue diuresis. Check cMRI. Arvilla Meres, MD 9:26 AM     Medications:     Scheduled Medications: . [MAR Hold] furosemide  40 mg Intravenous BID  . [MAR Hold] heparin  5,000 Units Subcutaneous Q8H  . [MAR Hold] hydrALAZINE  12.5 mg Oral Q8H  . [MAR Hold] isosorbide mononitrate  15 mg Oral Daily  . [MAR Hold] metoprolol succinate  50 mg Oral Daily  . [MAR Hold] potassium chloride  10 mEq Oral Daily  . [MAR Hold] sodium chloride flush  3 mL Intravenous Q12H  . [MAR Hold] spironolactone  12.5 mg Oral Daily     Infusions: . sodium chloride    .  sodium chloride 10 mL/hr at 07/19/20 0557     PRN Medications:  sodium chloride, [MAR Hold] acetaminophen **OR** [MAR Hold] acetaminophen, [MAR Hold] albuterol, [MAR Hold] ondansetron **OR** [MAR Hold] ondansetron (ZOFRAN) IV, [MAR Hold] oxyCODONE, [MAR Hold] polyethylene glycol, sodium chloride flush   Assessment/Plan:   1. Acute Systolic Heart Failure - new diagnosis, Echo LVEF 20-25%, global HK, RV mildly reduced  - cath 4/6 EF 15% normal cors. Preserved output. LVEDP 22 - ? Viral CM given h/o COVID 19 infection 12/21 vs HTN - plan cMRI - Continue IV Lasix 40 mg bid  - GDMT w/ gradual titration  - Continue Spironolactone 12.5 mg  - Switch hydral/NTG to Entresto - Continue Toprol - Plan SGLT2i prior to d/c (check Hgb A1c)  2. HTN:  - Elevated on admit, ? If cause of CM - improved control - continue to optimize HF regimen - h/o snoring. Will need outpatient sleep study to r/o OSA   3. Renal Insuffiencey - Baseline SCr ~1.3. 1.6 on admit and has remained stable w/ diuresis  - suspect possible HTN nephropathy - follow daily BMPs  - Add ARNI - eventual SGLT2i    Length of Stay: 5   Arvilla Meres MD 07/19/2020, 9:27 AM  Advanced Heart Failure Team Pager [phone] (M-F; 7a - 4p)  Please contact [facility] for night-coverage after hours (4p -7a ) and weekends on [email or web address]

## 2020-07-19 NOTE — Interval H&P Note (Signed)
History and Physical Interval Note:  07/19/2020 8:44 AM  Tony Meyer  has presented today for surgery, with the diagnosis of acute systolic heart failure.  The various methods of treatment have been discussed with the patient and family. After consideration of risks, benefits and other options for treatment, the patient has consented to  Procedure(s): RIGHT/LEFT HEART CATH AND CORONARY ANGIOGRAPHY (N/A) and possible coronary angioplasty as a surgical intervention.  The patient's history has been reviewed, patient examined, no change in status, stable for surgery.  I have reviewed the patient's chart and labs.  Questions were answered to the patient's satisfaction.     [name]

## 2020-07-19 NOTE — TOC Initial Note (Signed)
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 Tony Meyer reported that his employer is aware that he is in the hospital and isn't sure if he needs to file for short-term disability or not. CSW encouraged patient to reach out to his employer to verify next steps and provided the patient with the social workers name, number, and position and if anything changes to please reach out so that CSW can provide support as needed. Patient reported that he doesn't have a primary care doctor and would like help setting that up as well including discussion around food stamps. CSW will look into resources in [facility] for Tony Meyer and get back with him tomorrow.  TOC will continue to follow for d/c needs.   Expected Discharge Plan: Home/Self Care Barriers to Discharge: Continued Medical Work up   Patient Goals and CMS Choice Patient states their goals for this hospitalization and ongoing recovery are:: return home      Expected Discharge Plan and Services Expected Discharge Plan: Home/Self Care In-house Referral: Clinical Social Work     Living arrangements for the past 2 months: Single Family Home                 DME Arranged: N/A DME Agency: NA                  Prior Living Arrangements/Services Living arrangements for the past 2 months: Single Family Home Lives with:: Self Patient language and need for interpreter reviewed:: Yes Do you feel safe going back to the place where you live?: Yes      Need for Family Participation in Patient Care: No (Comment) Care giver  support system in place?: No (comment)   Criminal Activity/Legal Involvement Pertinent to Current Situation/Hospitalization: No - Comment as needed  Activities of Daily Living      Permission Sought/Granted Permission sought to share information with : Case Manager                Emotional Assessment Appearance:: Appears stated age Attitude/Demeanor/Rapport: Engaged Affect (typically observed): Pleasant Orientation: : Oriented to Self,Oriented to Place,Oriented to  Time,Oriented to Situation Alcohol / Substance Use: Not Applicable Psych Involvement: No (comment)  Admission diagnosis:  CHF exacerbation (HCC) [I50.9] Acute congestive heart failure, unspecified heart failure type (HCC) [I50.9] Acute heart failure (HCC) [I50.9] Acute systolic heart failure (HCC) [I50.21] Patient Active Problem List   Diagnosis Date Noted  . Acute systolic heart failure (HCC) 07/18/2020  . Acute congestive heart failure (HCC)   . Chronic kidney disease   . Elevated troponin 07/14/2020  . Acute heart failure (HCC) 07/14/2020  . CHF exacerbation (HCC) 07/13/2020  . Hypoxia 03/15/2020  . Essential hypertension 03/15/2020  . AKI (acute kidney injury) (HCC) 03/15/2020  . Pneumonia due to COVID-19 virus 03/14/2020   PCP:  Patient, No Pcp Per (Inactive) Pharmacy:   [facility] [address] [place] - [address] [place] [address] Phone: [phone] Fax: [phone]     Social Determinants  of Health (SDOH) Interventions Financial Strain Interventions: Intervention Not Indicated Housing Interventions: Intervention Not Indicated Transportation Interventions: Intervention Not Press photographer (Pt. needs transportation home since he was taken by ambulance from [place] to [place] and his vehicle is at [employer])  Readmission Risk Interventions No flowsheet data found.  Tony Meyer, MSW, LCSWA [phone] Heart Failure Social Worker

## 2020-07-19 NOTE — Plan of Care (Signed)
  Problem: Clinical Measurements: Goal: Ability to maintain clinical measurements within normal limits will improve 07/19/2020 2042 by Brion Aliment, RN Outcome: Progressing 07/19/2020 2033 by Brion Aliment, RN Outcome: Progressing Goal: Will remain free from infection 07/19/2020 2042 by Brion Aliment, RN Outcome: Progressing 07/19/2020 2033 by Brion Aliment, RN Outcome: Progressing Goal: Diagnostic test results will improve 07/19/2020 2042 by Brion Aliment, RN Outcome: Progressing 07/19/2020 2033 by Brion Aliment, RN Outcome: Progressing Goal: Respiratory complications will improve 07/19/2020 2042 by Brion Aliment, RN Outcome: Progressing 07/19/2020 2033 by Brion Aliment, RN Outcome: Progressing Goal: Cardiovascular complication will be avoided 07/19/2020 2042 by Brion Aliment, RN Outcome: Progressing 07/19/2020 2033 by Brion Aliment, RN Outcome: Progressing   Problem: Activity: Goal: Risk for activity intolerance will decrease Outcome: Progressing   Problem: Nutrition: Goal: Adequate nutrition will be maintained Outcome: Progressing

## 2020-07-20 ENCOUNTER — Inpatient Hospital Stay (HOSPITAL_COMMUNITY): Payer: 59

## 2020-07-20 ENCOUNTER — Encounter (HOSPITAL_COMMUNITY): Payer: Self-pay | Admitting: Internal Medicine

## 2020-07-20 ENCOUNTER — Other Ambulatory Visit (HOSPITAL_COMMUNITY): Payer: Self-pay

## 2020-07-20 DIAGNOSIS — I5021 Acute systolic (congestive) heart failure: Secondary | ICD-10-CM

## 2020-07-20 LAB — BASIC METABOLIC PANEL
Anion gap: 6 (ref 5–15)
BUN: 23 mg/dL — ABNORMAL HIGH (ref 6–20)
CO2: 26 mmol/L (ref 22–32)
Calcium: 8.5 mg/dL — ABNORMAL LOW (ref 8.9–10.3)
Chloride: 105 mmol/L (ref 98–111)
Creatinine, Ser: 1.51 mg/dL — ABNORMAL HIGH (ref 0.61–1.24)
GFR, Estimated: 56 mL/min — ABNORMAL LOW (ref >60)
Glucose, Bld: 119 mg/dL — ABNORMAL HIGH (ref 70–99)
Potassium: 4.2 mmol/L (ref 3.5–5.1)
Sodium: 137 mmol/L (ref 135–145)

## 2020-07-20 LAB — HEMOGLOBIN A1C
Hgb A1c MFr Bld: 6.7 % — ABNORMAL HIGH (ref 4.8–5.6)
Mean Plasma Glucose: 145.59 mg/dL

## 2020-07-20 LAB — MAGNESIUM: Magnesium: 2.2 mg/dL (ref 1.7–2.4)

## 2020-07-20 MED ORDER — SPIRONOLACTONE 25 MG PO TABS
25.0000 mg | ORAL_TABLET | Freq: Every day | ORAL | Status: DC
  Administered 2020-07-21: 25 mg via ORAL
  Filled 2020-07-20: qty 1

## 2020-07-20 MED ORDER — GADOBUTROL 1 MMOL/ML IV SOLN
10.0000 mL | Freq: Once | INTRAVENOUS | Status: AC | PRN
  Administered 2020-07-20: 10 mL via INTRAVENOUS

## 2020-07-20 MED ORDER — DAPAGLIFLOZIN PROPANEDIOL 10 MG PO TABS
10.0000 mg | ORAL_TABLET | Freq: Every day | ORAL | Status: DC
  Administered 2020-07-20 – 2020-07-21 (×2): 10 mg via ORAL
  Filled 2020-07-20 (×2): qty 1

## 2020-07-20 MED ORDER — POTASSIUM CHLORIDE CRYS ER 20 MEQ PO TBCR
20.0000 meq | EXTENDED_RELEASE_TABLET | Freq: Every day | ORAL | Status: DC
  Administered 2020-07-21: 20 meq via ORAL
  Filled 2020-07-20: qty 1

## 2020-07-20 NOTE — TOC Progression Note (Addendum)
Transition of Care (TOC) - Progression Note  Heart Failure   Patient Details  Name: Tony Meyer MRN: [id number] Date of Birth: 1970/07/06  Transition of Care [facility]) CM/SW Contact  Synai Prettyman, LCSWA Phone Number: 07/20/2020, 12:33 PM  Clinical Narrative:    CSW spoke with the patient at bedside and obtained his signature for the Food Stamp referral for the [facility] to reach out to him. CSW spoke with [name] about contacting his employer about the short-term disability and patient reported that he would and let the CSW know if he needs help. CSW faxed over the Food Stamp application and informed the patient that the [facility] will be in touch and to reach out to CSW as social needs arise.  4:30pm: CSW received a call from Brance Dartt about the patient and his health insurance and short term disability with patients employer. CSW dropped off paperwork for a [employer] application in case patients insurance isn't active and informed the patient to bring the paperwork with him to his [facility] outpatient appointment if needed.  TOC will continue to follow for d/c needs.    Expected Discharge Plan: Home/Self Care Barriers to Discharge: Continued Medical Work up  Expected Discharge Plan and Services Expected Discharge Plan: Home/Self Care In-house Referral: Clinical Social Work     Living arrangements for the past 2 months: Single Family Home                 DME Arranged: N/A DME Agency: NA                   Social Determinants of Health (SDOH) Interventions Financial Strain Interventions: Intervention Not Indicated Housing Interventions: Intervention Not Indicated Transportation Interventions: Intervention Not Press photographer (Pt. needs transportation home since he was taken by ambulance from [place] to [place] and his vehicle is at [employer])  Readmission Risk Interventions No flowsheet data found.  Hyder Deman, MSW,  LCSWA [phone] Heart Failure Social Worker

## 2020-07-20 NOTE — Discharge Summary (Addendum)
Advanced Heart Failure Team  Discharge Summary   Patient ID: Tony Meyer MRN: [id number], DOB/AGE: 1970-09-11 50 y.o. Admit date: 07/13/2020 D/C date:     07/21/2020   Primary Discharge Diagnoses:  1. Acute Systolic Heart Failure 2. HTN 3. Renal Insufficiency  Hospital Course:   [name] w/ h/o COVID 19 infection 03/2020, HTN and chronic renal insuffiency who initially presented to [facility] on 3/31 w/ complaints of progressive dyspnea and peripheral edema. Dyspnea started shortly after being diagnosed w/ COVID and has progressively gotten worse, more so in the last 3 weeks.    At [facility] he was found to be in acute CHF. BNP 1510. CXR showed cardiomegaly and pulmonary edema. BP elevated 135/101. EKG sinus tach 116 bpm. HS trop 64>>60. COVID and Flu negative. BMP showed AKI w/ SCr at 1.6 (baseline ~1.3). K 4.1. Hepatic enzymes mildly elevated, AST 44.  ALT 70. TSH normal. LDL 79   2D echo showed severely reduced LVEF 20-25% w/ global HK, mild LVH, RV moderately enlarged and systolic function mildly reduced. No significant valvular disease. Only mild MR and mild TR. RVSP normal at 23 mmHg.    He was admitted and started on IV Lasix. GDMT initiated. On 4/5 he was transferred to [facility] for Tony Meyer and AHF consultation. R/LHC showed normal coronaries, severe NICM EF 15% w/ volume overload and preserved CO. IV Lasix continued for diuresis. GDMT further titrated. cMRI c/w NICM. Plan to keep him on lasix 40 mg daily and adjust as needed at follow up.   HF medications provided through TOC. He will continued to be followed in the HF clinic and has follow up next week. Letter provided to remain out of work until follow up.   Cardiac Studies  2D Echo 07/14/20 1. Left ventricular ejection fraction, by estimation, is 20 to 25%. The left ventricle has severely decreased function. The left ventricle demonstrates global hypokinesis. The left ventricular internal cavity size was mildly dilated. There is mild left  ventricular hypertrophy. Left ventricular diastolic parameters are indeterminate. 2. Right ventricular systolic function is mildly reduced. The right ventricular size is moderately enlarged. There is normal pulmonary artery systolic pressure. 3. Left atrial size was mildly dilated. 4. Right atrial size was mildly dilated. 5. The mitral valve is abnormal. Mild mitral valve regurgitation. No evidence of mitral stenosis. 6. The aortic valve is tricuspid. There is mild calcification of the aortic valve. There is mild thickening of the aortic valve. Aortic valve regurgitation is not visualized. No aortic stenosis is present. 7. The inferior vena cava is dilated in size with <50% respiratory variability, suggesting right atrial pressure of 15 mmHg.   R/LHC 07/19/20 Ao = 103/79 (89) LV = 102/22 RA =  11 RV = 50/12 PA = 42/17 (26) PCW = 14 Fick cardiac output/index = 7.9/3.4 PVR = 1.5 WU SVR = 791 Ao sat = 98% PA sat = 75%, 76% SVC sat - 75%  Assessment:  1. Normal coronary arteries 2. Severe NICM EF 15% 3. Volume overload with preserved CO   cMRI 07/20/20 IMPRESSION: 1.  Moderately dilated LV with mild LVH, EF 23%.  2.  Moderately dilated RV with EF 21%.  3. Nonspecific inferior RV insertion site LGE suggestive of pressure/volume overload.  4. Extracellular volume percentage not significantly elevated and T2 signal not high.   Discharge Vitals: Blood pressure 111/72, pulse 93, temperature 97.9 F (36.6 C), resp. rate 16, height 6\' 3"  (1.905 m), weight 99.1 kg, SpO2 93 %. General:  Well appearing. No resp difficulty HEENT: normal Neck: supple. no JVD. Carotids 2+ bilat; no bruits. No lymphadenopathy or thryomegaly appreciated. Cor: PMI nondisplaced. Regular rate & rhythm. No rubs, gallops or murmurs. Lungs: clear Abdomen: soft, nontender, nondistended. No hepatosplenomegaly. No bruits or masses. Good bowel sounds. Extremities: no cyanosis, clubbing, rash, edema Neuro: alert &  orientedx3, cranial nerves grossly intact. moves all 4 extremities w/o difficulty. Affect pleasant   Labs: Lab Results  Component Value Date   WBC 6.6 07/19/2020   HGB 12.6 (L) 07/19/2020   HCT 37.0 (L) 07/19/2020   MCV 86.1 07/19/2020   PLT 157 07/19/2020    Recent Labs  Lab 07/21/20 0613  NA 138  K 4.1  CL 105  CO2 23  BUN 18  CREATININE 1.59*  CALCIUM 8.9  GLUCOSE 91   Lab Results  Component Value Date   CHOL 133 07/18/2020   HDL 43 07/18/2020   LDLCALC 79 07/18/2020   TRIG 54 07/18/2020   BNP (last 3 results) Recent Labs    07/16/20 0559 07/17/20 0456 07/18/20 0442  BNP 1,244.0* 1,178.0* 1,334.0*    ProBNP (last 3 results) No results for input(s): PROBNP in the last 8760 hours.   Diagnostic Studies/Procedures   MR CARDIAC MORPHOLOGY W WO CONTRAST  Result Date: 07/20/2020 CLINICAL DATA:  Cardiomyopathy of uncertain etiology EXAM: CARDIAC MRI TECHNIQUE: The patient was scanned on a 1.5 Tesla GE magnet. A dedicated cardiac coil was used. Functional imaging was done using Fiesta sequences. 2,3, and 4 chamber views were done to assess for RWMA's. Modified Simpson's rule using a short axis stack was used to calculate an ejection fraction on a dedicated work [employer], [employer]. The patient received 10 cc of Gadavist. After 10 minutes inversion recovery sequences were used to assess for infiltration and scar tissue. CONTRAST:  Gadavist 10 cc FINDINGS: Trivial bilateral pleural effusions. Moderately dilated left ventricle with mild concentric LV hypertrophy. Diffuse LV hypokinesis, EF 23%. No LV thrombus. Moderately dilated right ventricle, EF 21%. Mild biatrial enlargement. Trileaflet aortic valve, no significant stenosis or regurgitation. No significant mitral regurgitation noted. On delayed enhancement imaging, there was a small area of subepicardial late gadolinium enhancement (LGE) at the inferoseptal RV insertion site. Measurements: LVEDV 296 mL LVSV 67 mL  LVEF 23% RVEDV 311 mL RVSV 66 mL RVEF 21% T2 45 in septum and lateral wall (normal range) ECV 30% IMPRESSION: 1.  Moderately dilated LV with mild LVH, EF 23%. 2.  Moderately dilated RV with EF 21%. 3. Nonspecific inferior RV insertion site LGE suggestive of pressure/volume overload. 4. Extracellular volume percentage not significantly elevated and T2 signal not high. Propes Mclean Electronically Signed   By: Marca Ancona M.D.   On: 07/20/2020 12:45    Discharge Medications   Allergies as of 07/21/2020   No Known Allergies      Medication List     STOP taking these medications    albuterol 108 (90 Base) MCG/ACT inhaler Commonly known as: VENTOLIN HFA   amLODipine 5 MG tablet Commonly known as: NORVASC   ascorbic acid 500 MG tablet Commonly known as: VITAMIN C   guaiFENesin-dextromethorphan 100-10 MG/5ML syrup Commonly known as: ROBITUSSIN DM   metoprolol tartrate 50 MG tablet Commonly known as: LOPRESSOR       TAKE these medications    dapagliflozin propanediol 10 MG Tabs tablet Commonly known as: FARXIGA Take 1 tablet (10 mg total) by mouth daily. Start taking on: July 22, 2020   furosemide 40  MG tablet Commonly known as: Lasix Take 40 mg daily   metoprolol succinate 50 MG 24 hr tablet Commonly known as: TOPROL-XL Take 1 tablet (50 mg total) by mouth daily. Take with or immediately following a meal. Start taking on: July 22, 2020   potassium chloride SA 20 MEQ tablet Commonly known as: KLOR-CON Take 1 tablet (20 mEq total) by mouth daily. Take or one tablet every time you take your as needed lasix   sacubitril-valsartan 24-26 MG Commonly known as: ENTRESTO Take 1 tablet by mouth 2 (two) times daily.   spironolactone 25 MG tablet Commonly known as: ALDACTONE Take 1 tablet (25 mg total) by mouth daily. Start taking on: July 22, 2020        Disposition   The patient will be discharged in stable condition to home. Discharge Instructions      (HEART FAILURE PATIENTS) Call MD:  Anytime you have any of the following symptoms: 1) 3 pound weight gain in 24 hours or 5 pounds in 1 week 2) shortness of breath, with or without a dry hacking cough 3) swelling in the hands, feet or stomach 4) if you have to sleep on extra pillows at night in order to breathe.   Complete by: As directed    Diet - low sodium heart healthy   Complete by: As directed    Discharge instructions   Complete by: As directed    Please follow these special instructions:  1. Follow a low-salt diet - you are allowed no more than 2,000mg  of sodium per day. Watch your fluid intake. In general, you should not be taking in more than 2 liters of fluid per day (no more than 8 glasses per day). This includes sources of water in foods like soup, coffee, tea, milk, etc. 2. Weigh yourself on the same scale at same time of day and keep a log. 3. Take your as needed lasix and call our office: (Anytime you feel any of the following symptoms)  - 3lb weight gain overnight or 5lb within a few days - Shortness of breath, with or without a dry hacking cough  - Swelling in the hands, feet or stomach  - If you have to sleep on extra pillows at night in order to breathe   Increase activity slowly   Complete by: As directed        Follow-up Information     [facility] Follow up on 07/27/2020.   Specialty: Cardiology Why: Please follow up in our clinic on 07/27/20 at 1:30pm our gate code is [id number] Contact information: [address] [id number] mc [place] [address] [phone]                  Duration of Discharge Encounter: Greater than 35 minutes   Signed, Tonye Becket NP-C  07/21/2020, 10:45 AM  Patient seen and examined with the above-signed Advanced Practice Provider and/or Housestaff. I personally reviewed laboratory data, imaging studies and relevant notes. I independently examined the patient and formulated  the important aspects of the plan. I have edited the note to reflect any of my changes or salient points. I have personally discussed the plan with the patient and/or family.  He is much improved. Stable for d/c. Meds, cath & MRI results reviewed with him.   General:  Well appearing. No resp difficulty HEENT: normal Neck: supple. no JVD. Carotids 2+ bilat; no bruits. No lymphadenopathy or thryomegaly appreciated. Cor: PMI nondisplaced.  Regular rate & rhythm. No rubs, gallops or murmurs. Lungs: clear Abdomen: soft, nontender, nondistended. No hepatosplenomegaly. No bruits or masses. Good bowel sounds. Extremities: no cyanosis, clubbing, rash, edema Neuro: alert & orientedx3, cranial nerves grossly intact. moves all 4 extremities w/o difficulty. Affect pleasant  Ok for d/c on above meds. F/u in HF Clinic.   Arvilla Meres, MD  7:17 PM

## 2020-07-20 NOTE — Progress Notes (Addendum)
Advanced Heart Failure Rounding Note   Subjective:    Cath yesterday showed normal coronaries, severe NICM EF 15% w/ volume overload and preserved CO   -2.2 L in UOP w/ IV Lasix yesterday, Net I/Os only -1.2L for the day. Wt up 2 lb. SCr stable 1.51. K 4.2.    He continues w/ SOB w/ ambulation.     Getting cMRI today.   R/LHC 4/6 Ao = 103/79 (89) LV = 102/22 RA =  11 RV = 50/12 PA = 42/17 (26) PCW = 14 Fick cardiac output/index = 7.9/3.4 PVR = 1.5 WU SVR = 791 Ao sat = 98% PA sat = 75%, 76% SVC sat - 75%    Objective:   Weight Range:  Vital Signs:   Temp:  [97.8 F (36.6 C)-98.2 F (36.8 C)] 97.8 F (36.6 C) (04/07 0431) Pulse Rate:  [77-94] 90 (04/07 0431) Resp:  [5-26] 18 (04/07 0431) BP: (106-146)/(72-99) 111/88 (04/07 0431) SpO2:  [91 %-100 %] 93 % (04/07 0431) Weight:  [103 kg] 103 kg (04/07 0024) Last BM Date: 07/18/20  Weight change: Filed Weights   07/19/20 0013 07/19/20 0500 07/20/20 0024  Weight: 102.2 kg 102.2 kg 103 kg    Intake/Output:   Intake/Output Summary (Last 24 hours) at 07/20/2020 0834 Last data filed at 07/19/2020 2200 Gross per 24 hour  Intake 933 ml  Output 2180 ml  Net -1247 ml     PHYSICAL EXAM:  General:  Well appearing. No respiratory difficulty HEENT: normal Neck: supple. JVD 10 cm. Carotids 2+ bilat; no bruits. No lymphadenopathy or thyromegaly appreciated. Cor: PMI nondisplaced. Regular rate & rhythm. No rubs, gallops or murmurs. Lungs: decreased BS at the bases  Abdomen: soft, nontender, nondistended. No hepatosplenomegaly. No bruits or masses. Good bowel sounds. Extremities: no cyanosis, clubbing, rash, edema Neuro: alert & oriented x 3, cranial nerves grossly intact. moves all 4 extremities w/o difficulty. Affect pleasant.    Telemetry: NSR 80s Personally reviewed  Labs: Basic Metabolic Panel: Recent Labs  Lab 07/16/20 0559 07/17/20 0456 07/18/20 0442 07/19/20 0358 07/19/20 0902 07/19/20 0909  07/19/20 0917 07/20/20 0445  NA 144 143 142 139 145 142  142 145 137  K 3.8 3.5 4.0 3.6 3.2* 3.3*  3.6 3.4* 4.2  CL 105 106 105 105  --   --   --  105  CO2 29 26 26 28   --   --   --  26  GLUCOSE 99 160* 96 98  --   --   --  119*  BUN 31* 36* 37* 25*  --   --   --  23*  CREATININE 1.61* 1.68* 1.64* 1.56*  --   --   --  1.51*  CALCIUM 8.7* 8.5* 8.8* 8.9  --   --   --  8.5*  MG 1.9 1.9 2.0 2.0  --   --   --  2.2    Liver Function Tests: Recent Labs  Lab 07/13/20 2020 07/14/20 0443  AST 44* 39  ALT 70* 62*  ALKPHOS 63 56  BILITOT 1.0 1.1  PROT 6.1* 5.6*  ALBUMIN 2.9* 2.7*   No results for input(s): LIPASE, AMYLASE in the last 168 hours. No results for input(s): AMMONIA in the last 168 hours.  CBC: Recent Labs  Lab 07/13/20 2020 07/14/20 0443 07/19/20 0358 07/19/20 0902 07/19/20 0909 07/19/20 0917  WBC 8.3 9.3 6.6  --   --   --   NEUTROABS 5.6 6.7  --   --   --   --  HGB 13.2 13.0 12.8* 11.9* 12.6*  13.3 12.6*  HCT 42.5 41.5 39.8 35.0* 37.0*  39.0 37.0*  MCV 88.5 89.2 86.1  --   --   --   PLT 227 223 157  --   --   --     Cardiac Enzymes: No results for input(s): CKTOTAL, CKMB, CKMBINDEX, TROPONINI in the last 168 hours.  BNP: BNP (last 3 results) Recent Labs    07/16/20 0559 07/17/20 0456 07/18/20 0442  BNP 1,244.0* 1,178.0* 1,334.0*    ProBNP (last 3 results) No results for input(s): PROBNP in the last 8760 hours.    Other results:  Imaging: CARDIAC CATHETERIZATION  Result Date: 07/19/2020 Findings: Ao = 103/79 (89) LV = 102/22 RA =  11 RV = 50/12 PA = 42/17 (26) PCW = 14 Fick cardiac output/index = 7.9/3.4 PVR = 1.5 WU SVR = 791 Ao sat = 98% PA sat = 75%, 76% SVC sat - 75% Assessment: 1. Normal coronary arteries 2. Severe NICM EF 15% 3. Volume overload with preserved CO Plan/Discussion: Severe NICM. Persistent volume overload. Titrate GDMT. Continue diuresis. Check cMRI. Arvilla Meres, MD 9:26 AM    Medications:     Scheduled  Medications: . enoxaparin (LOVENOX) injection  40 mg Subcutaneous Q24H  . furosemide  40 mg Intravenous BID  . metoprolol succinate  50 mg Oral Daily  . potassium chloride  10 mEq Oral Daily  . sacubitril-valsartan  1 tablet Oral BID  . sodium chloride flush  3 mL Intravenous Q12H  . sodium chloride flush  3 mL Intravenous Q12H  . spironolactone  12.5 mg Oral Daily    Infusions: . sodium chloride      PRN Medications: sodium chloride, acetaminophen, albuterol, ondansetron **OR** ondansetron (ZOFRAN) IV, oxyCODONE, polyethylene glycol, sodium chloride flush   Assessment/Plan:   1. Acute Systolic Heart Failure - new diagnosis, Echo LVEF 20-25%, global HK, RV mildly reduced  - cath 4/6 EF 15% normal cors. Preserved output. LVEDP 22 - ? Viral CM given h/o COVID 19 infection 12/21 vs HTN - cMRI completed, interpretation pending  - Continue IV Lasix 40 mg bid  - GDMT w/ gradual titration  - Increase Spironolactone to 25 mg daily  - Continue Entresto 24-26 mg bid - Continue Toprol 50 mg daily  - Add Farxiga next (Hgb A1c pending)   2. HTN:  - Elevated on admit, ? If cause of CM - improved control - continue to optimize HF regimen - h/o snoring. Will need outpatient sleep study to r/o OSA   3. Renal Insuffiencey - Baseline SCr ~1.3. 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 I personally reviewed laboratory data, imaging studies and relevant notes. I independently examined the patient and formulated the important aspects of the plan. I have edited the note to  reflect any of my changes or salient points. I have personally discussed the plan with the patient and/or family.  Cath and MRI results reviewed with him in detail.   He is feeling much better with diuresis. JVP still up.   General:  Well appearing. No resp difficulty HEENT: normal Neck: supple. JVP 8 Carotids 2+ bilat; no bruits. No lymphadenopathy or thryomegaly appreciated. Cor: PMI nondisplaced. Regular rate & rhythm. No rubs, gallops or murmurs. Lungs: clear Abdomen: soft, nontender, nondistended. No hepatosplenomegaly. No bruits or masses. Good bowel sounds. Extremities: no cyanosis, clubbing, rash, edema Neuro: alert & orientedx3, cranial nerves grossly intact. moves all 4 extremities w/o difficulty. Affect pleasant  Continue to titrate GDMT. Continue IV diuresis one more day. Likely home in am.   Arvilla Meres, MD  4:28 PM

## 2020-07-20 NOTE — Plan of Care (Signed)

## 2020-07-21 ENCOUNTER — Other Ambulatory Visit (HOSPITAL_COMMUNITY): Payer: Self-pay

## 2020-07-21 ENCOUNTER — Encounter (HOSPITAL_COMMUNITY): Payer: Self-pay | Admitting: Adult Health

## 2020-07-21 LAB — BASIC METABOLIC PANEL
Anion gap: 10 (ref 5–15)
BUN: 18 mg/dL (ref 6–20)
CO2: 23 mmol/L (ref 22–32)
Calcium: 8.9 mg/dL (ref 8.9–10.3)
Chloride: 105 mmol/L (ref 98–111)
Creatinine, Ser: 1.59 mg/dL — ABNORMAL HIGH (ref 0.61–1.24)
GFR, Estimated: 52 mL/min — ABNORMAL LOW (ref >60)
Glucose, Bld: 91 mg/dL (ref 70–99)
Potassium: 4.1 mmol/L (ref 3.5–5.1)
Sodium: 138 mmol/L (ref 135–145)

## 2020-07-21 MED ORDER — POTASSIUM CHLORIDE CRYS ER 20 MEQ PO TBCR
20.0000 meq | EXTENDED_RELEASE_TABLET | Freq: Every day | ORAL | 6 refills | Status: DC
  Filled 2020-07-21: qty 30, 30d supply, fill #0

## 2020-07-21 MED ORDER — METOPROLOL SUCCINATE ER 50 MG PO TB24
50.0000 mg | ORAL_TABLET | Freq: Every day | ORAL | 0 refills | Status: DC
  Filled 2020-07-21: qty 30, 30d supply, fill #0

## 2020-07-21 MED ORDER — FUROSEMIDE 40 MG PO TABS
40.0000 mg | ORAL_TABLET | ORAL | 0 refills | Status: DC | PRN
  Filled 2020-07-21: qty 30, fill #0

## 2020-07-21 MED ORDER — FUROSEMIDE 40 MG PO TABS
ORAL_TABLET | ORAL | 6 refills | Status: DC
  Filled 2020-07-21: qty 30, 30d supply, fill #0

## 2020-07-21 MED ORDER — DAPAGLIFLOZIN PROPANEDIOL 10 MG PO TABS
10.0000 mg | ORAL_TABLET | Freq: Every day | ORAL | 0 refills | Status: DC
  Filled 2020-07-21: qty 30, 30d supply, fill #0

## 2020-07-21 MED ORDER — SACUBITRIL-VALSARTAN 24-26 MG PO TABS
1.0000 | ORAL_TABLET | Freq: Two times a day (BID) | ORAL | 0 refills | Status: DC
  Filled 2020-07-21: qty 60, 30d supply, fill #0

## 2020-07-21 MED ORDER — POTASSIUM CHLORIDE CRYS ER 20 MEQ PO TBCR
20.0000 meq | EXTENDED_RELEASE_TABLET | ORAL | 0 refills | Status: DC | PRN
  Filled 2020-07-21: qty 30, fill #0

## 2020-07-21 MED ORDER — SPIRONOLACTONE 25 MG PO TABS
25.0000 mg | ORAL_TABLET | Freq: Every day | ORAL | 0 refills | Status: DC
  Filled 2020-07-21: qty 30, 30d supply, fill #0

## 2020-07-21 NOTE — Progress Notes (Signed)
D/C instructions given and reviewed. Tele and IV removed, tolerated well. Awaiting TOC meds. 

## 2020-07-21 NOTE — TOC Transition Note (Addendum)
Transition of Care [facility]) - CM/SW Discharge Note Heart Failure  Patient Details  Name: Tony Meyer MRN: [id number] Date of Birth: 24-Jan-1971  Transition of Care [facility]) CM/SW Contact:  Shaqueta Casady, LCSWA Phone Number: 07/21/2020, 11:35 AM   Clinical Narrative:    CSW spoke with patient at bedside and confirmed the patient has transportation home and he reported he just called a friend to come pick him up. CSW brought patient an appointment card and reiterated to patient to follow up with the [facility] clinic appointment and get with the social worker if he needs help with his employer working on the short term disability depending on if the doctor states he is able to work or not depending on his health.  CSW will sign off for now as social work intervention is no longer needed. Please consult [place] again if new needs arise.   Final next level of care: Home/Self Care Barriers to Discharge: No Barriers Identified   Patient Goals and CMS Choice Patient states their goals for this hospitalization and ongoing recovery are:: return home      Discharge Placement                       Discharge Plan and Services In-house Referral: Clinical Social Work              DME Arranged: N/A DME Agency: NA                  Social Determinants of Health (SDOH) Interventions Financial Strain Interventions: Intervention Not Indicated Housing Interventions: Intervention Not Indicated Transportation Interventions: Intervention Not Press photographer (Pt. needs transportation home since he was taken by ambulance from [place] to [place] and his vehicle is at [employer])   Readmission Risk Interventions No flowsheet data found.   Jakevion Arney, MSW, LCSWA [phone] Heart Failure Social Worker

## 2020-07-21 NOTE — Plan of Care (Signed)
  Problem: Activity: Goal: Capacity to carry out activities will improve Outcome: Progressing   Problem: Cardiac: Goal: Ability to achieve and maintain adequate cardiopulmonary perfusion will improve Outcome: Progressing   Problem: Education: Goal: Knowledge of General Education information will improve Description: Including pain rating scale, medication(s)/side effects and non-pharmacologic comfort measures Outcome: Progressing   Problem: Education: Goal: Ability to demonstrate management of disease process will improve Outcome: Progressing Goal: Ability to verbalize understanding of medication therapies will improve Outcome: Progressing Goal: Individualized Educational Video(s) Outcome: Progressing   

## 2020-07-21 NOTE — Plan of Care (Signed)

## 2020-07-21 NOTE — Progress Notes (Signed)
Advanced Heart Failure Rounding Note   Subjective:    -4/6 Cath  showed normal coronaries, severe NICM EF 15% w/ volume overload and preserved CO   Denies dyspnea, orthopnea or PND.  Ambulated in the hall yesterday was not short of breath.  5L output yesterday weight down 8-9 lbs.  Renal fx stable.      Objective:   Weight Range:  Vital Signs:   Temp:  [97.4 F (36.3 C)-98.3 F (36.8 C)] 97.9 F (36.6 C) (04/08 0732) Pulse Rate:  [81-96] 93 (04/08 0732) Resp:  [16-20] 16 (04/08 0732) BP: (91-134)/(64-88) 111/72 (04/08 0732) SpO2:  [92 %-99 %] 93 % (04/08 0732) Weight:  [99.1 kg] 99.1 kg (04/08 0607) Last BM Date: 07/20/20  Weight change: Filed Weights   07/19/20 0500 07/20/20 0024 07/21/20 0607  Weight: 102.2 kg 103 kg 99.1 kg    Intake/Output:   Intake/Output Summary (Last 24 hours) at 07/21/2020 0756 Last data filed at 07/21/2020 0600 Gross per 24 hour  Intake 1100 ml  Output 4750 ml  Net -3650 ml     PHYSICAL EXAM:  Cardiac: JVD flat, normal rate and rhythm, clear s1 and s2, no murmurs, rubs or gallops, no LE edema Pulmonary: CTAB, not in distress Abdominal: non distended abdomen, soft and nontender Psych: Alert, conversant, in good spirits   Telemetry: NSR 80s-90's Personally reviewed  Labs: Basic Metabolic Panel: Recent Labs  Lab 07/16/20 0559 07/17/20 0456 07/18/20 0442 07/19/20 0358 07/19/20 0902 07/19/20 0909 07/19/20 0917 07/20/20 0445 07/21/20 0613  NA 144 143 142 139 145 142  142 145 137 138  K 3.8 3.5 4.0 3.6 3.2* 3.3*  3.6 3.4* 4.2 4.1  CL 105 106 105 105  --   --   --  105 105  CO2 29 26 26 28   --   --   --  26 23  GLUCOSE 99 160* 96 98  --   --   --  119* 91  BUN 31* 36* 37* 25*  --   --   --  23* 18  CREATININE 1.61* 1.68* 1.64* 1.56*  --   --   --  1.51* 1.59*  CALCIUM 8.7* 8.5* 8.8* 8.9  --   --   --  8.5* 8.9  MG 1.9 1.9 2.0 2.0  --   --   --  2.2  --     Liver Function Tests: No results for input(s): AST, ALT,  ALKPHOS, BILITOT, PROT, ALBUMIN in the last 168 hours. No results for input(s): LIPASE, AMYLASE in the last 168 hours. No results for input(s): AMMONIA in the last 168 hours.  CBC: Recent Labs  Lab 07/19/20 0358 07/19/20 0902 07/19/20 0909 07/19/20 0917  WBC 6.6  --   --   --   HGB 12.8* 11.9* 12.6*  13.3 12.6*  HCT 39.8 35.0* 37.0*  39.0 37.0*  MCV 86.1  --   --   --   PLT 157  --   --   --     Cardiac Enzymes: No results for input(s): CKTOTAL, CKMB, CKMBINDEX, TROPONINI in the last 168 hours.  BNP: BNP (last 3 results) Recent Labs    07/16/20 0559 07/17/20 0456 07/18/20 0442  BNP 1,244.0* 1,178.0* 1,334.0*    ProBNP (last 3 results) No results for input(s): PROBNP in the last 8760 hours.    Other results:  Imaging: CARDIAC CATHETERIZATION  Result Date: 07/19/2020 Findings: Ao = 103/79 (89) LV = 102/22 RA =  11 RV = 50/12 PA = 42/17 (26) PCW = 14 Fick cardiac output/index = 7.9/3.4 PVR = 1.5 WU SVR = 791 Ao sat = 98% PA sat = 75%, 76% SVC sat - 75% Assessment: 1. Normal coronary arteries 2. Severe NICM EF 15% 3. Volume overload with preserved CO Plan/Discussion: Severe NICM. Persistent volume overload. Titrate GDMT. Continue diuresis. Check cMRI. Arvilla Meres, MD 9:26 AM  MR CARDIAC MORPHOLOGY W WO CONTRAST  Result Date: 07/20/2020 CLINICAL DATA:  Cardiomyopathy of uncertain etiology EXAM: CARDIAC MRI TECHNIQUE: The patient was scanned on a 1.5 Tesla GE magnet. A dedicated cardiac coil was used. Functional imaging was done using Fiesta sequences. 2,3, and 4 chamber views were done to assess for RWMA's. Modified Simpson's rule using a short axis stack was used to calculate an ejection fraction on a dedicated work [employer], [employer]. The patient received 10 cc of Gadavist. After 10 minutes inversion recovery sequences were used to assess for infiltration and scar tissue. CONTRAST:  Gadavist 10 cc FINDINGS: Trivial bilateral pleural effusions. Moderately  dilated left ventricle with mild concentric LV hypertrophy. Diffuse LV hypokinesis, EF 23%. No LV thrombus. Moderately dilated right ventricle, EF 21%. Mild biatrial enlargement. Trileaflet aortic valve, no significant stenosis or regurgitation. No significant mitral regurgitation noted. On delayed enhancement imaging, there was a small area of subepicardial late gadolinium enhancement (LGE) at the inferoseptal RV insertion site. Measurements: LVEDV 296 mL LVSV 67 mL LVEF 23% RVEDV 311 mL RVSV 66 mL RVEF 21% T2 45 in septum and lateral wall (normal range) ECV 30% IMPRESSION: 1.  Moderately dilated LV with mild LVH, EF 23%. 2.  Moderately dilated RV with EF 21%. 3. Nonspecific inferior RV insertion site LGE suggestive of pressure/volume overload. 4. Extracellular volume percentage not significantly elevated and T2 signal not high. Sedore Mclean Electronically Signed   By: Marca Ancona M.D.   On: 07/20/2020 12:45     Medications:     Scheduled Medications: . dapagliflozin propanediol  10 mg Oral Daily  . enoxaparin (LOVENOX) injection  40 mg Subcutaneous Q24H  . furosemide  40 mg Intravenous BID  . metoprolol succinate  50 mg Oral Daily  . potassium chloride  20 mEq Oral Daily  . sacubitril-valsartan  1 tablet Oral BID  . sodium chloride flush  3 mL Intravenous Q12H  . spironolactone  25 mg Oral Daily    Infusions: . sodium chloride      PRN Medications: sodium chloride, acetaminophen, albuterol, ondansetron **OR** ondansetron (ZOFRAN) IV, oxyCODONE, polyethylene glycol, sodium chloride flush   Assessment/Plan:   1. Acute Systolic Heart Failure - new diagnosis, Echo LVEF 20-25%, global HK, RV mildly reduced  - cath 4/6 EF 15% normal cors. Preserved output. LVEDP 22 - ? 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 HTN:  - Elevated on admit, ? If cause of CM - improved control - continue to optimize HF regimen - h/o snoring. Will need outpatient sleep study to r/o OSA   3. Renal Insuffiencey - Baseline SCr ~1.3. 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

## 2020-07-25 ENCOUNTER — Encounter (HOSPITAL_COMMUNITY): Payer: 59

## 2020-07-27 ENCOUNTER — Other Ambulatory Visit: Payer: Self-pay

## 2020-07-27 ENCOUNTER — Ambulatory Visit (HOSPITAL_COMMUNITY)
Admit: 2020-07-27 | Discharge: 2020-07-27 | Disposition: A | Discharge status: Home / Self Care | Payer: 59 | Attending: Cardiology | Admitting: Cardiology

## 2020-07-27 ENCOUNTER — Encounter (HOSPITAL_COMMUNITY): Payer: Self-pay

## 2020-07-27 VITALS — BP 108/88 | HR 83 | Ht 75.0 in | Wt 219.0 lb

## 2020-07-27 DIAGNOSIS — I509 Heart failure, unspecified: Secondary | ICD-10-CM

## 2020-07-27 DIAGNOSIS — G4719 Other hypersomnia: Secondary | ICD-10-CM

## 2020-07-27 DIAGNOSIS — I5022 Chronic systolic (congestive) heart failure: Secondary | ICD-10-CM | POA: Diagnosis not present

## 2020-07-27 DIAGNOSIS — I130 Hypertensive heart and chronic kidney disease with heart failure and stage 1 through stage 4 chronic kidney disease, or unspecified chronic kidney disease: Secondary | ICD-10-CM | POA: Diagnosis not present

## 2020-07-27 DIAGNOSIS — Z7984 Long term (current) use of oral hypoglycemic drugs: Secondary | ICD-10-CM | POA: Diagnosis not present

## 2020-07-27 DIAGNOSIS — G4710 Hypersomnia, unspecified: Secondary | ICD-10-CM | POA: Insufficient documentation

## 2020-07-27 DIAGNOSIS — Z79899 Other long term (current) drug therapy: Secondary | ICD-10-CM | POA: Diagnosis not present

## 2020-07-27 DIAGNOSIS — N182 Chronic kidney disease, stage 2 (mild): Secondary | ICD-10-CM | POA: Diagnosis not present

## 2020-07-27 DIAGNOSIS — Z8616 Personal history of COVID-19: Secondary | ICD-10-CM | POA: Diagnosis not present

## 2020-07-27 DIAGNOSIS — I10 Essential (primary) hypertension: Secondary | ICD-10-CM

## 2020-07-27 LAB — BASIC METABOLIC PANEL
Anion gap: 11 (ref 5–15)
BUN: 21 mg/dL — ABNORMAL HIGH (ref 6–20)
CO2: 23 mmol/L (ref 22–32)
Calcium: 9.5 mg/dL (ref 8.9–10.3)
Chloride: 105 mmol/L (ref 98–111)
Creatinine, Ser: 1.69 mg/dL — ABNORMAL HIGH (ref 0.61–1.24)
GFR, Estimated: 49 mL/min — ABNORMAL LOW (ref >60)
Glucose, Bld: 105 mg/dL — ABNORMAL HIGH (ref 70–99)
Potassium: 4.6 mmol/L (ref 3.5–5.1)
Sodium: 139 mmol/L (ref 135–145)

## 2020-07-27 MED ORDER — POTASSIUM CHLORIDE CRYS ER 20 MEQ PO TBCR: 20.0000 meq | EXTENDED_RELEASE_TABLET | ORAL | 6 refills | Status: DC | PRN

## 2020-07-27 MED ORDER — FUROSEMIDE 40 MG PO TABS: 40.0000 mg | ORAL_TABLET | ORAL | 6 refills | Status: DC | PRN

## 2020-07-27 NOTE — Progress Notes (Addendum)
ADVANCED HF CLINIC CONSULT NOTE  Referring Physician:Dr. Wayne Sever Primary Care: None Primary Cardiologist: Dr. Wayne Sever  HPI:  [name] w/ h/o COVID 19 infection 03/2020, HTN and chronic renal insuffiency who initially presented to [facility] on 3/31 w/ complaints of progressive dyspnea and peripheral edema. Dyspnea started shortly after being diagnosed w/ COVID and has progressively gotten worse, more so in the last 3 weeks.   At [facility] he was found to be in acute CHF. BNP 1510. CXR showed cardiomegaly and pulmonary edema. BP elevated 135/101. EKG sinus tach 116 bpm. HS trop 64>>60. COVID and Flu negative. BMP showed AKI w/ SCr at 1.6 (baseline ~1.3). K 4.1. Hepatic enzymes mildly elevated, AST 44. ALT 70. TSH normal. LDL 79  2D echo showed severely reduced LVEF 20-25% w/ global HK, mild LVH, RV moderately enlarged and systolic function mildly reduced. No significant valvular disease. Only mild MR and mild TR. RVSP normal at 23 mmHg.   He was admitted and started on IV Lasix. GDMT initiated. On 4/5 he was transferred to [facility] for [facility] and AHF consultation.R/LHC showed normal coronaries, severe NICM EF 15% w/ volume overload and preserved CO. IV Lasix continued for diuresis. GDMT further titrated. cMRI c/w nonspecific findings and severely low EF. discharged on lasix 40 mg daily and adjust as needed at follow up.   Since hospital discharge feels his breathing continues to improve.  Still having shortness of breath with increased exertion, washing his car was difficult but he was able to do it.  No trouble ambulating without carrying things unless he has to move objects like carrying the clothes basket etc.  Denies shortness of breath at rest, no orthopnea.  Weight has been stable at home 214-215lbs the last several days was 218 when he first came home.  Watching salt and fluid intake.  He does mention after taking his medications feels very run down and drowsy, not sure which  medication is doing it as he takes it at the same time.     Review of Systems: [y] = yes, [ ]  = no   General: Weight gain [ ] ; Weight loss ]; Anorexia [ ] ; Fatigue [ y]; Fever [ ] ; Chills [ ] ; Weakness ]  Cardiac: Chest pain/pressure [ ] ; Resting SOB [ ] ; Exertional SOB [place] ]; Orthopnea [ ] ; Pedal Edema [ ] ; Palpitations [ ] ; Syncope [ ] ; Presyncope [ ] ; Paroxysmal nocturnal dyspnea[ ]   Pulmonary: Cough [ ] ; Wheezing[ ] ; Hemoptysis[ ] ; Sputum [ ] ; Snoring [ ]   GI: Vomiting[ ] ; Dysphagia[ ] ; Melena[ ] ; Hematochezia [ ] ; Heartburn[ ] ; Abdominal pain [ ] ; Constipation [ ] ; Diarrhea [ ] ; BRBPR [ ]   GU: Hematuria[ ] ; Dysuria [ ] ; Nocturia[ ]   Vascular: Pain in legs with walking [ ] ; Pain in feet with lying flat [ ] ; Non-healing sores [ ] ; Stroke [ ] ; TIA [ ] ; Slurred speech [ ] ;  Neuro: Headaches[ ] ; Vertigo[ ] ; Seizures[ ] ; Paresthesias[ ] ;Blurred vision [ ] ; Diplopia [ ] ; Vision changes [ ]   Ortho/Skin: Arthritis ]; Joint pain [ ] ; Muscle pain [ ] ; Joint swelling [ ] ; Back Pain [ ] ; Rash [ ]   Psych: Depression[ ] ; Anxiety[ ]   Heme: Bleeding problems [ ] ; Clotting disorders [ ] ; Anemia [ ]   Endocrine: Diabetes [ ] ; Thyroid dysfunction[ ]    Past Medical History:  Diagnosis Date  . COVID-19 virus infection 03/2020  . Hypertension   . Renal insufficiency   . Systolic heart failure (  HCC) 06/2020    Current Outpatient Medications  Medication Sig Dispense Refill  . dapagliflozin propanediol (FARXIGA) 10 MG TABS tablet Take 1 tablet (10 mg total) by mouth daily. 30 tablet 0  . metoprolol succinate (TOPROL-XL) 50 MG 24 hr tablet Take 1 tablet (50 mg total) by mouth daily. Take with or immediately following a meal. 30 tablet 0  . sacubitril-valsartan (ENTRESTO) 24-26 MG Take 1 tablet by mouth 2 (two) times daily. 60 tablet 0  . spironolactone (ALDACTONE) 25 MG tablet Take 1 tablet (25 mg total) by mouth daily. 30 tablet 0  . furosemide (LASIX) 40 MG tablet Take 1 tablet (40 mg total) by  mouth as needed. Take 1 tablet (40 mg) by mouth daily 30 tablet 6  . potassium chloride SA (KLOR-CON) 20 MEQ tablet Take 1 tablet (20 mEq total) by mouth as needed. Take or one tablet every time you take your as needed lasix 30 tablet 6   No current facility-administered medications for this encounter.    No Known Allergies    Social History   Socioeconomic History  . Marital status: Single    Spouse name: Not on file  . Number of children: Not on file  . Years of education: Not on file  . Highest education level: Not on file  Occupational History  . Not on file  Tobacco Use  . Smoking status: Never Smoker  . Smokeless tobacco: Never Used  Vaping Use  . Vaping Use: Never used  Substance and Sexual Activity  . Alcohol use: Never  . Drug use: Never  . Sexual activity: Not on file  Other Topics Concern  . Not on file  Social History Narrative  . Not on file   Social Determinants of Health   Financial Resource Strain: Medium Risk  . Difficulty of Paying Living Expenses: Somewhat hard  Food Insecurity: Not on file  Transportation Needs: No Transportation Needs  . Lack of Transportation (Medical): No  . Lack of Transportation (Non-Medical): No  Physical Activity: Not on file  Stress: Not on file  Social Connections: Not on file  Intimate Partner Violence: Not on file      Family History  Problem Relation Age of Onset  . Heart failure Neg Hx     Vitals:   07/27/20 1334  BP: 108/88  Pulse: 83  SpO2: 98%  Weight: 99.3 kg (219 lb)  Height: 6\' 3"  (1.905 m)    PHYSICAL EXAM: General:  Well appearing. No respiratory difficulty HEENT: normal Neck: supple. no JVD. Carotids 2+ bilat; no bruits. No lymphadenopathy or thryomegaly appreciated. Cor: PMI nondisplaced. Regular rate & rhythm. No rubs, gallops or murmurs. Lungs: clear Abdomen: soft, nontender, nondistended. No hepatosplenomegaly. No bruits or masses. Good bowel sounds. Extremities: no cyanosis,  clubbing, rash, edema Neuro: alert & oriented x 3, cranial nerves grossly intact. moves all 4 extremities w/o difficulty. Affect pleasant.   ASSESSMENT & PLAN:  Chronic Systolic Heart Failure: - new diagnosis, Echo LVEF 20-25%, global HK, RV mildly reduced  - cath 4/6 EF 15% normal cors. Preserved output. LVEDP 22 - ? 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

## 2020-07-27 NOTE — Patient Instructions (Addendum)
CHANGE Lasix 40 mg to as needed  (3 lb weight gain overnight, 5 lb weight gain in a week, increased SOB or swelling)  CHANGE Potassium 20 meq to as needed with every dose of Lasix  -if you need to take Lasix and Potassium for more than 3 days please give [place] a call  Labs today We will only contact you if something comes back abnormal or we need to make some changes. Otherwise no news is good news!   Your physician recommends that you schedule a follow-up appointment in: 1 month   Do the following things EVERYDAY: 1) Weigh yourself in the morning before breakfast. Write it down and keep it in a log. 2) Take your medicines as prescribed 3) Eat low salt foods--Limit salt (sodium) to 2000 mg per day.  4) Stay as active as you can everyday 5) Limit all fluids for the day to less than 2 liters At the [facility], you and your health needs are our priority. As part of our continuing mission to provide you with exceptional heart care, we have created designated Provider Care Teams. These Care Teams include your primary Cardiologist (physician) and Advanced Practice Providers (APPs- Physician Assistants and Nurse Practitioners) who all work together to provide you with the care you need, when you need it.   You may see any of the following providers on your designated Care Team at your next follow up: Marland Kitchen Dr Arvilla Meres . Dr Marca Ancona . Dr Thornell Mule . Tonye Becket, NP . Robbie Lis, PA . Shanda Bumps [place],NP . Karle Plumber, PharmD   Please be sure to bring in all your medications bottles to every appointment.   If you have any questions or concerns before your next appointment please send [place] a message through [place] or call our office at [phone].    TO LEAVE A MESSAGE FOR THE NURSE SELECT OPTION 2, PLEASE LEAVE A MESSAGE INCLUDING: . YOUR NAME . DATE OF BIRTH . CALL BACK NUMBER . REASON FOR CALL**this is important as we prioritize the call  backs  YOU WILL RECEIVE A CALL BACK THE SAME DAY AS LONG AS YOU CALL BEFORE 4:00 PM

## 2020-07-28 ENCOUNTER — Encounter (HOSPITAL_COMMUNITY): Payer: Self-pay

## 2020-08-04 ENCOUNTER — Telehealth (HOSPITAL_COMMUNITY): Payer: Self-pay | Admitting: Licensed Clinical Social Worker

## 2020-08-04 NOTE — Telephone Encounter (Signed)
CSW attempted to call pt to follow up regarding insurance concerns.  Unable to reach or leave VM.  Will continue to attempt to reach pt to discuss.  Burna Sis, LCSW Clinical Social Worker Advanced [facility] Failure Clinic Desk#: [phone] Cell#: [phone]

## 2020-08-28 NOTE — Progress Notes (Signed)
PCP: None  Primary HF Cardiologist: Dr Gala Romney   CC: Heart Failure   HPI: Tony Meyer is a [name] w/ h/o COVID 19 infection 03/2020, HTN and chronic renal insuffiency .   Presented to [facility] on 07/13/20 with increased dyapnea and edema. Dyspnea started shortly after being diagnosed w/ COVID. ECHO completed and showed severely reduced EF 20-25% and RV mildly reduced. Started on IV lasix. Transferred to [facility] on 07/18/20 for Advanced Heart Failure consultation. R/LHC showed normal coronaries, severe NICM EF 15% w/ volume overload and preserved CO. GDMT further titrated. cMRI LVEF < 25% and moderately reduced RV -->c/w NICM. Discharged 07/20/20.   He was seen in HF clinic on 07/27/20 and was started on metoprolol xl but never started due to cost.  He continued on entresto, farxiga, and spironolactone. Also recommended to get sleep study. Limited income for medications.   Today he returns for post hospital HF follow up.Overall feels great. Able to do whatever he wants without symptoms.  Denies SOB/PND/Orthopnea. Appetite ok. No fever or chills. Weight at home 222 pounds. Ran out meds 5 days ago. Want to return to work. 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  Lives with his cousin.    Cardiac Studies  2D Echo 07/14/20 1. Left ventricular ejection fraction, by estimation, is 20 to 25%. The left ventricle has severely decreased function. The left ventricle demonstrates global hypokinesis. The left ventricular internal cavity size was mildly dilated. There is mild left ventricular hypertrophy. Left ventricular diastolic parameters are indeterminate. 2. Right ventricular systolic function is mildly reduced. The right ventricular size is moderately enlarged. There is normal pulmonary artery systolic pressure. 3. Left atrial size was mildly dilated. 4. Right atrial size was mildly dilated. 5. The mitral valve is abnormal. Mild mitral valve regurgitation. No evidence of mitral stenosis. 6. The  aortic valve is tricuspid. There is mild calcification of the aortic valve. There is mild thickening of the aortic valve. Aortic valve regurgitation is not visualized. No aortic stenosis is present. 7. The inferior vena cava is dilated in size with <50% respiratory variability, suggesting right atrial pressure of 15 mmHg.   R/LHC 07/19/20 Ao = 103/79 (89) LV = 102/22 RA = 11 RV = 50/12 PA = 42/17 (26) PCW = 14 Fick cardiac output/index = 7.9/3.4 PVR = 1.5 WU SVR = 791 Ao sat = 98% PA sat = 75%, 76% SVC sat - 75% Assessment: 1. Normal coronary arteries 2. Severe NICM EF 15% 3. Volume overload with preserved CO  cMRI 07/20/20 1. Moderately dilated LV with mild LVH, EF 23%. 2. Moderately dilated RV with EF 21%. 3. Nonspecific inferior RV insertion site LGE suggestive of pressure/volume overload. 4. Extracellular volume percentage not significantly elevated and T2 signal not high.  ROS: All systems negative except as listed in HPI, PMH and Problem List.  SH:  Social History   Socioeconomic History  . Marital status: Single    Spouse name: Not on file  . Number of children: Not on file  . Years of education: Not on file  . Highest education level: Not on file  Occupational History  . Not on file  Tobacco Use  . Smoking status: Never Smoker  . Smokeless tobacco: Never Used  Vaping Use  . Vaping Use: Never used  Substance and Sexual Activity  . Alcohol use: Never  . Drug use: Never  . Sexual activity: Not on file  Other Topics Concern  . Not on  file  Social History Narrative  . Not on file   Social Determinants of Health   Financial Resource Strain: Medium Risk  . Difficulty of Paying Living Expenses: Somewhat hard  Food Insecurity: Not on file  Transportation Needs: No Transportation Needs  . Lack of Transportation (Medical): No  . Lack of Transportation (Non-Medical): No  Physical Activity: Not on file  Stress: Not on file  Social Connections:  Not on file  Intimate Partner Violence: Not on file    FH:  Family History  Problem Relation Age of Onset  . Heart failure Neg Hx     Past Medical History:  Diagnosis Date  . COVID-19 virus infection 03/2020  . Hypertension   . Renal insufficiency   . Systolic heart failure (HCC) 06/2020    Current Outpatient Medications  Medication Sig Dispense Refill  . sacubitril-valsartan (ENTRESTO) 24-26 MG Take 1 tablet by mouth 2 (two) times daily. (Patient taking differently: Take 1 tablet by mouth daily.) 60 tablet 0  . dapagliflozin propanediol (FARXIGA) 10 MG TABS tablet Take 1 tablet (10 mg total) by mouth daily. (Patient not taking: Reported on 08/29/2020) 30 tablet 0  . furosemide (LASIX) 40 MG tablet Take 1 tablet (40 mg total) by mouth as needed. Take 1 tablet (40 mg) by mouth daily (Patient not taking: Reported on 08/29/2020) 30 tablet 6  . potassium chloride SA (KLOR-CON) 20 MEQ tablet Take 1 tablet (20 mEq total) by mouth as needed. Take or one tablet every time you take your as needed lasix (Patient not taking: Reported on 08/29/2020) 30 tablet 6  . spironolactone (ALDACTONE) 25 MG tablet Take 1 tablet (25 mg total) by mouth daily. (Patient not taking: Reported on 08/29/2020) 30 tablet 0   No current facility-administered medications for this encounter.    Vitals:   08/29/20 1002  BP: (!) 166/118  Pulse: 83  SpO2: 99%  Weight: 103.4 kg (228 lb)  Height: 6\' 3"  (1.905 m)   Wt Readings from Last 3 Encounters:  08/29/20 103.4 kg (228 lb)  07/27/20 99.3 kg (219 lb)  07/21/20 99.1 kg (218 lb 6.4 oz)    PHYSICAL EXAM: General:  Well appearing. No resp difficulty HEENT: normal Neck: supple. JVP flat. Carotids 2+ bilaterally; no bruits. No lymphadenopathy or thryomegaly appreciated. Cor: PMI normal. Regular rate & rhythm. No rubs, gallops or murmurs. Lungs: clear Abdomen: soft, nontender, nondistended. No hepatosplenomegaly. No bruits or masses. Good bowel  sounds. Extremities: no cyanosis, clubbing, rash, edema Neuro: alert & orientedx3, cranial nerves grossly intact. Moves all 4 extremities w/o difficulty. Affect pleasant.  EKG: SR 84 bpm QRS 98 ms.  ASSESSMENT & PLAN: 1. Chronic  Systolic Heart Failure - 07/2020 new diagnosis, Echo LVEF 20-25%, global HK, RV mildly reduced  - cath 07/19/20 EF 15% normal cors. Preserved output. LVEDP 22 - CMRI 07/21/20  EF < 25 RV moderately reduced.  NYHA I. Volume status stable.  He has been out of HF meds for the last 5 days due to cost and never started Toprol XL for the same reason.   - Hold off on bb and consider next visit. Favor coreg when added due to cost.   - Today I am going restart entresto 24-26 mg twice a day, spironolactone 25 mg daily, and farxiga 10 mg daily. We discussed how to take medications and the importance of compliance.  - Can take lasix as needed. Can stay off potassium.  -Plan to repeat ECHO after HF optimized . QRS  narrow, CRT-D not indicated.  2. HTN:  - Elevated . Restarting meds as noted above.  - Sleep study recommended last visit. Will follow up.  3. 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 Noncompliance with meds -Financial barrier. Referred to HFSW to assistance.   Stressed the importance of medication compliance. Will try to get meds from HF med program and reach out to Pharmacy team for patient assistance farxiga/entesto.  Greater than 50% of the (total minutes 25) visit spent in counseling/coordination of care regarding the above.    HF SW for PCP Follow up 2-3 weeks with pharmacy. Follow up 3 months with Dr Gala Romney and an ECHO   Tony Menon NP-C  10:46 AM

## 2020-08-29 ENCOUNTER — Ambulatory Visit (HOSPITAL_COMMUNITY)
Admission: RE | Admit: 2020-08-29 | Discharge: 2020-08-29 | Disposition: A | Discharge status: Home / Self Care | Payer: 59 | Source: Ambulatory Visit | Attending: Adult Health | Admitting: Adult Health

## 2020-08-29 ENCOUNTER — Other Ambulatory Visit (HOSPITAL_COMMUNITY): Payer: Self-pay

## 2020-08-29 ENCOUNTER — Encounter (HOSPITAL_COMMUNITY): Payer: Self-pay

## 2020-08-29 ENCOUNTER — Other Ambulatory Visit: Payer: Self-pay

## 2020-08-29 VITALS — BP 166/118 | HR 83 | Ht 75.0 in | Wt 228.0 lb

## 2020-08-29 DIAGNOSIS — I130 Hypertensive heart and chronic kidney disease with heart failure and stage 1 through stage 4 chronic kidney disease, or unspecified chronic kidney disease: Secondary | ICD-10-CM | POA: Insufficient documentation

## 2020-08-29 DIAGNOSIS — Z79899 Other long term (current) drug therapy: Secondary | ICD-10-CM | POA: Diagnosis not present

## 2020-08-29 DIAGNOSIS — I5022 Chronic systolic (congestive) heart failure: Secondary | ICD-10-CM | POA: Insufficient documentation

## 2020-08-29 DIAGNOSIS — I428 Other cardiomyopathies: Secondary | ICD-10-CM | POA: Insufficient documentation

## 2020-08-29 DIAGNOSIS — I509 Heart failure, unspecified: Secondary | ICD-10-CM

## 2020-08-29 DIAGNOSIS — Z9114 Patient's other noncompliance with medication regimen: Secondary | ICD-10-CM | POA: Insufficient documentation

## 2020-08-29 DIAGNOSIS — Z8616 Personal history of COVID-19: Secondary | ICD-10-CM | POA: Insufficient documentation

## 2020-08-29 DIAGNOSIS — I10 Essential (primary) hypertension: Secondary | ICD-10-CM

## 2020-08-29 MED ORDER — FUROSEMIDE 40 MG PO TABS: 40.0000 mg | ORAL_TABLET | Freq: Every day | ORAL | 6 refills | Status: DC | PRN

## 2020-08-29 MED ORDER — SACUBITRIL-VALSARTAN 24-26 MG PO TABS: 1.0000 | ORAL_TABLET | Freq: Two times a day (BID) | ORAL | 1 refills | Status: DC

## 2020-08-29 MED ORDER — SPIRONOLACTONE 25 MG PO TABS: 25.0000 mg | ORAL_TABLET | Freq: Every day | ORAL | 3 refills | Status: DC

## 2020-08-29 MED ORDER — DAPAGLIFLOZIN PROPANEDIOL 10 MG PO TABS: 10.0000 mg | ORAL_TABLET | Freq: Every day | ORAL | 3 refills | Status: DC

## 2020-08-29 MED ORDER — POTASSIUM CHLORIDE CRYS ER 20 MEQ PO TBCR: 20.0000 meq | EXTENDED_RELEASE_TABLET | Freq: Every day | ORAL | 6 refills | Status: DC | PRN

## 2020-08-29 NOTE — Progress Notes (Signed)
Reviewed all medications, directions, and importance of taking them properly as well as cost and how to use/activate copay cards. Pt very appreciative for assistance.

## 2020-08-29 NOTE — Progress Notes (Signed)
Patient provided Farxiga samples, farxiga and entresto copay cards, and [employer] giftcards to help with expenses  Medication Samples have been provided to the patient.  Drug name: [name]       Strength: 10mg         Qty: 2  LOT  Exp.Date: 11/13/22  Dosing instructions: take 1 tab Daily  The patient has been instructed regarding the correct time, dose, and frequency of taking this medication, including desired effects and most common side effects.   Tony Meyer 11:00 AM 08/29/2020

## 2020-08-29 NOTE — Progress Notes (Addendum)
CSW met with pt to check on financial concerns as he has been out of work for a month.  Clinic staff is assisting him with getting [employer] gift cards to help with current copays so should be able to get his medications after visit today.  Pt reports he is able to afford sufficient food and pay for housing expenses with help from family members.  No concerns expressed at this time- encouraged pt to reach out if he needs anything in the future but thinks he will be ok because he states he will be able to go back to work now.  Pt also without a PCP- CSW provided pt with list of in network providers and instructed him to reach out to one convenient for him to get established  Will continue to follow through clinic and assist as needed  Tony Meyer, [place] [facility] Desk#: [phone] Cell#: [phone]

## 2020-08-29 NOTE — Patient Instructions (Addendum)
Medications:   Spironolactone 25 mg Daily  Entresto 24/26 mg Twice daily **use copay card**  Farxiga 10 mg Daily **we have given you samples for 2 weeks, then use copay card**  Furosemide **only take AS NEEDED**  Potassium **only take if you take Furosemide**   Please follow up with our heart failure pharmacist in 3-4 weeks  Your physician recommends that you schedule a follow-up appointment in: 6 weeks  If you have any questions or concerns before your next appointment please send [place] a message through [place] or call our office at [phone].    TO LEAVE A MESSAGE FOR THE NURSE SELECT OPTION 2, PLEASE LEAVE A MESSAGE INCLUDING: . YOUR NAME . DATE OF BIRTH . CALL BACK NUMBER . REASON FOR CALL**this is important as we prioritize the call backs  YOU WILL RECEIVE A CALL BACK THE SAME DAY AS LONG AS YOU CALL BEFORE 4:00 PM  At the Advanced [facility] Failure Clinic, you and your health needs are our priority. As part of our continuing mission to provide you with exceptional heart care, we have created designated Provider Care Teams. These Care Teams include your primary Cardiologist (physician) and Advanced Practice Providers (APPs- Physician Assistants and Nurse Practitioners) who all work together to provide you with the care you need, when you need it.   You may see any of the following providers on your designated Care Team at your next follow up: Marland Kitchen Dr Arvilla Meres . Dr Marca Ancona . Dr Thornell Mule . Tonye Becket, NP . Robbie Lis, PA . Shanda Bumps [place],NP . Karle Plumber, PharmD   Please be sure to bring in all your medications bottles to every appointment.

## 2020-10-03 NOTE — Progress Notes (Signed)
ADVANCED HEART FAILURE CLINIC NOTE  PCP: None  Primary HF Cardiologist: Dr. Gala Romney   CC: Heart Failure   HPI: [name] is a [name] w/ h/o COVID 19 infection 03/2020, HTN, chronic renal insuffiency and new diagnosis of systolic HF.  Presented to [facility] on 07/13/20 with increased dyapnea and edema. Dyspnea started shortly after being diagnosed w/ COVID. ECHO showed severely reduced EF 20-25% and RV mildly reduced. Started on IV lasix. Transferred to [facility] on 07/18/20. R/LHC showed normal coronaries, severe NICM EF 15% w/ volume overload and preserved CO. GDMT further titrated. cMRI LVEF < 25% and moderately reduced RV -->c/w NICM. Discharged 07/20/20.   He was seen in HF clinic on 07/27/20 and was started on metoprolol xl but never started due to cost.  He continued on Entresto, Farxiga, and spironolactone. Also recommended to get sleep study. Limited income for medications.   Today he returns for HF follow up. Last seen 5/22 and had ran out of meds for a week due to cost. Overall feeling fine. Denies increasing SOB, CP, dizziness, edema, or PND/Orthopnea. Appetite ok. No fever or chills. Weight at home 221 pounds. Taking all medications.   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  Lives with his cousin. Says he keeps fluids <2L/day, does not eat much fast food.  Cardiac Studies   - Echo (4/22): EF 20-25% w/ global HK, mild LVH, RV moderately enlarged and systolic function mildly reduced. No significant valvular disease. Only mild MR and mild TR. RVSP normal at 23 mmHg.     R/LHC 07/19/20 Ao = 103/79 (89) LV = 102/22 RA =  11 RV = 50/12 PA = 42/17 (26) PCW = 14 Fick cardiac output/index = 7.9/3.4 PVR = 1.5 WU SVR = 791 Ao sat = 98% PA sat = 75%, 76% SVC sat - 75%  Assessment:  1. Normal coronary arteries 2. Severe NICM EF 15% 3. Volume overload with preserved CO   cMRI 07/20/20 1.  Moderately dilated LV with mild LVH, EF 23%.  2.  Moderately  dilated RV with EF 21%.  3. Nonspecific inferior RV insertion site LGE suggestive of pressure/volume overload.  4. Extracellular volume percentage not significantly elevated and T2 signal not high.  ROS: All systems negative except as listed in HPI, PMH and Problem List.  SH:  Social History   Socioeconomic History   Marital status: Single    Spouse name: Not on file   Number of children: Not on file   Years of education: Not on file   Highest education level: Not on file  Occupational History   Not on file  Tobacco Use   Smoking status: Never   Smokeless tobacco: Never  Vaping Use   Vaping Use: Never used  Substance and Sexual Activity   Alcohol use: Never   Drug use: Never   Sexual activity: Not on file  Other Topics Concern   Not on file  Social History Narrative   Not on file   Social Determinants of Health   Financial Resource Strain: Medium Risk   Difficulty of Paying Living Expenses: Somewhat hard  Food Insecurity: Not on file  Transportation Needs: No Transportation Needs   Lack of Transportation (Medical): No   Lack of Transportation (Non-Medical): No  Physical Activity: Not on file  Stress: Not on file  Social Connections: Not on file  Intimate Partner Violence: Not on file   FH:  Family  History  Problem Relation Age of Onset   Heart failure Neg Hx     Past Medical History:  Diagnosis Date   COVID-19 virus infection 03/2020   Hypertension    Renal insufficiency    Systolic heart failure (HCC) 06/2020   Current Outpatient Medications  Medication Sig Dispense Refill   dapagliflozin propanediol (FARXIGA) 10 MG TABS tablet Take 1 tablet (10 mg total) by mouth daily. 90 tablet 3   furosemide (LASIX) 40 MG tablet Take 1 tablet (40 mg total) by mouth daily as needed. 30 tablet 6   potassium chloride SA (KLOR-CON) 20 MEQ tablet Take 1 tablet (20 mEq total) by mouth daily as needed (when you take furosemide). 30 tablet 6   sacubitril-valsartan  (ENTRESTO) 24-26 MG Take 1 tablet by mouth 2 (two) times daily. 180 tablet 1   spironolactone (ALDACTONE) 25 MG tablet Take 1 tablet (25 mg total) by mouth daily. 90 tablet 3   No current facility-administered medications for this encounter.   Vitals:   10/04/20 0833  BP: 140/80  Pulse: 74  SpO2: 99%  Weight: 106.1 kg (233 lb 12.8 oz)   Wt Readings from Last 3 Encounters:  10/04/20 106.1 kg (233 lb 12.8 oz)  08/29/20 103.4 kg (228 lb)  07/27/20 99.3 kg (219 lb)   PHYSICAL EXAM: General:  NAD. No resp difficulty HEENT: Normal Neck: Supple. No JVD. Carotids 2+ bilat; no bruits. No lymphadenopathy or thryomegaly appreciated. Cor: PMI nondisplaced. Regular rate & rhythm. No rubs, gallops or murmurs. Lungs: Clear Abdomen: Soft, nontender, nondistended. No hepatosplenomegaly. No bruits or masses. Good bowel sounds. Extremities: No cyanosis, clubbing, rash, edema Neuro: Alert & oriented x 3, cranial nerves grossly intact. Moves all 4 extremities w/o difficulty. Affect pleasant.  Reds: 47%  ASSESSMENT & PLAN: 1. Chronic Systolic Heart Failure - 07/2020 new diagnosis, Echo EF 20-25%, global HK, RV mildly reduced  - Cath 07/19/20 EF 15% normal cors. Preserved output. LVEDP 22 - CMRI 07/21/20  EF < 25 RV moderately reduced.  - NYHA I. Volume status stable on exam, but weight up and Reds 47% - Start lasix 40 mg daily. Await BMET for KCl supp. - Hold off on starting carvedilol. - Continue Entresto 24/26 mg bid. - Continue Farxiga 10 mg daily. - Continue spironolactone 25 mg daily. - Plan to repeat ECHO after HF optimized (QRS narrow, CRT-D not indicated).  - BMET today, repeat 7-10 days.   2. HTN:  - Elevated. - Needs sleep study, but has no insurance.  3. Renal Insuffiencey - Suspect possible HTN nephropathy. - Labs today.  4. Noncompliance with meds - Financial barrier. Referred to HFSW to assistance.  - Stressed the importance of medication compliance.  - Has PCP info, stressed  importance of calling and making appt.  Greater than 50% of the (total minutes 25) visit spent in counseling/coordination of care regarding the above.   Follow up in 2 weeks with APP to reassess volume. Anticipate adding carvedilol at that time.  Prince Rome, FNP-BC 10/04/20

## 2020-10-04 ENCOUNTER — Other Ambulatory Visit: Payer: Self-pay

## 2020-10-04 ENCOUNTER — Ambulatory Visit (HOSPITAL_COMMUNITY)
Admission: RE | Admit: 2020-10-04 | Discharge: 2020-10-04 | Disposition: A | Discharge status: Home / Self Care | Payer: 59 | Source: Ambulatory Visit | Attending: Family Medicine | Admitting: Family Medicine

## 2020-10-04 ENCOUNTER — Encounter (HOSPITAL_COMMUNITY): Payer: Self-pay

## 2020-10-04 VITALS — BP 140/80 | HR 74 | Ht 75.0 in | Wt 233.8 lb

## 2020-10-04 DIAGNOSIS — Z7984 Long term (current) use of oral hypoglycemic drugs: Secondary | ICD-10-CM | POA: Insufficient documentation

## 2020-10-04 DIAGNOSIS — I5022 Chronic systolic (congestive) heart failure: Secondary | ICD-10-CM | POA: Diagnosis not present

## 2020-10-04 DIAGNOSIS — I10 Essential (primary) hypertension: Secondary | ICD-10-CM

## 2020-10-04 DIAGNOSIS — N182 Chronic kidney disease, stage 2 (mild): Secondary | ICD-10-CM

## 2020-10-04 DIAGNOSIS — I130 Hypertensive heart and chronic kidney disease with heart failure and stage 1 through stage 4 chronic kidney disease, or unspecified chronic kidney disease: Secondary | ICD-10-CM | POA: Diagnosis not present

## 2020-10-04 DIAGNOSIS — Z79899 Other long term (current) drug therapy: Secondary | ICD-10-CM | POA: Insufficient documentation

## 2020-10-04 DIAGNOSIS — Z09 Encounter for follow-up examination after completed treatment for conditions other than malignant neoplasm: Secondary | ICD-10-CM | POA: Diagnosis not present

## 2020-10-04 DIAGNOSIS — Z8616 Personal history of COVID-19: Secondary | ICD-10-CM | POA: Insufficient documentation

## 2020-10-04 DIAGNOSIS — I428 Other cardiomyopathies: Secondary | ICD-10-CM | POA: Diagnosis not present

## 2020-10-04 DIAGNOSIS — N189 Chronic kidney disease, unspecified: Secondary | ICD-10-CM | POA: Diagnosis not present

## 2020-10-04 DIAGNOSIS — Z9114 Patient's other noncompliance with medication regimen: Secondary | ICD-10-CM | POA: Diagnosis not present

## 2020-10-04 LAB — BASIC METABOLIC PANEL
Anion gap: 6 (ref 5–15)
BUN: 12 mg/dL (ref 6–20)
CO2: 27 mmol/L (ref 22–32)
Calcium: 9.5 mg/dL (ref 8.9–10.3)
Chloride: 104 mmol/L (ref 98–111)
Creatinine, Ser: 1.35 mg/dL — ABNORMAL HIGH (ref 0.61–1.24)
GFR, Estimated: >60 mL/min (ref >60)
Glucose, Bld: 97 mg/dL (ref 70–99)
Potassium: 4.3 mmol/L (ref 3.5–5.1)
Sodium: 137 mmol/L (ref 135–145)

## 2020-10-04 MED ORDER — FUROSEMIDE 40 MG PO TABS: 40.0000 mg | ORAL_TABLET | Freq: Every day | ORAL | 2 refills | Status: DC

## 2020-10-04 NOTE — Progress Notes (Signed)
ReDS Vest / Clip - 10/04/20 0900       ReDS Vest / Clip   Station Marker C    Ruler Value 30    ReDS Value Range High volume overload    ReDS Actual Value 47

## 2020-10-04 NOTE — Patient Instructions (Signed)
CHANGE Lasix to 40 mg, daily  Labs today We will only contact you if something comes back abnormal or we need to make some changes. Otherwise no news is good news!  Your physician has recommended that you have a sleep study. This test records several body functions during sleep, including: brain activity, eye movement, oxygen and carbon dioxide blood levels, heart rate and rhythm, breathing rate and rhythm, the flow of air through your mouth and nose, snoring, body muscle movements, and chest and belly movement.  Your physician recommends that you schedule a follow-up appointment in: 2 weeks  in the Advanced Practitioners (PA/NP) Clinic and in 4 weeks with the pharmacy team  Do the following things EVERYDAY: Weigh yourself in the morning before breakfast. Write it down and keep it in a log. Take your medicines as prescribed Eat low salt foods--Limit salt (sodium) to 2000 mg per day.  Stay as active as you can everyday Limit all fluids for the day to less than 2 liters  At the Advanced [facility] Failure Clinic, you and your health needs are our priority. As part of our continuing mission to provide you with exceptional heart care, we have created designated Provider Care Teams. These Care Teams include your primary Cardiologist (physician) and Advanced Practice Providers (APPs- Physician Assistants and Nurse Practitioners) who all work together to provide you with the care you need, when you need it.   You may see any of the following providers on your designated Care Team at your next follow up: Dr Arvilla Meres Dr Marca Ancona Dr Brandon Melnick, NP Robbie Lis, [place] Mikki Santee Karle Plumber, PharmD   Please be sure to bring in all your medications bottles to every appointment.   If you have any questions or concerns before your next appointment please send [place] a message through [place] or call our office at [phone].    TO LEAVE A MESSAGE FOR THE NURSE SELECT  OPTION 2, PLEASE LEAVE A MESSAGE INCLUDING: YOUR NAME DATE OF BIRTH CALL BACK NUMBER REASON FOR CALL**this is important as we prioritize the call backs  YOU WILL RECEIVE A CALL BACK THE SAME DAY AS LONG AS YOU CALL BEFORE 4:00 PM

## 2020-10-04 NOTE — Progress Notes (Signed)
Patient Name: [facility]        DOB: 12/30/1970      Height: 6'3"    Weight:273lb  Office Name:[facility]         Referring Provider:Jessica Wende Mott, NP/Daniel Bensimhon, MD  Today's Date: 10/04/20  Date:   STOP BANG RISK ASSESSMENT S (snore) Have you been told that you snore?     YES   T (tired) Are you often tired, fatigued, or sleepy during the day?   NO  O (obstruction) Do you stop breathing, choke, or gasp during sleep? NO   P (pressure) Do you have or are you being treated for high blood pressure? YES   B (BMI) Is your body index greater than 35 kg/m? NO   A (age) Are you 60 years old or older? YES   N (neck) Do you have a neck circumference greater than 16 inches?   YES   G (gender) Are you a male? YES   TOTAL STOP/BANG "YES" ANSWERS                                                                        For Office Use Only              Procedure Order Form    YES to 3+ Stop Bang questions OR two clinical symptoms - patient qualifies for WatchPAT (CPT 95800)             Clinical Notes: Will consult Sleep Specialist and refer for management of therapy due to patient increased risk of Sleep Apnea. Ordering a sleep study due to the following two clinical symptoms:  / Loud snoring R06.83 History of high blood pressure R03.0    I understand that I am proceeding with a home sleep apnea test as ordered by my treating physician. I understand that untreated sleep apnea is a serious cardiovascular risk factor and it is my responsibility to perform the test and seek management for sleep apnea. I will be contacted with the results and be managed for sleep apnea by a local sleep physician. I will be receiving equipment and further instructions from [facility]. I shall promptly ship back the equipment via the included mailing label. I understand my insurance will be billed for the test and as the patient I am responsible for any insurance related  out-of-pocket costs incurred. I have been provided with written instructions and can call for additional video or telephonic instruction, with 24-hour availability of qualified personnel to answer any questions: Patient Help Desk [phone].  Patient Signature ______________________________________________________   Date______________________ Patient Telemedicine Verbal Consent

## 2020-10-14 NOTE — Progress Notes (Addendum)
ADVANCED HEART FAILURE CLINIC NOTE  PCP: None  Primary HF Cardiologist: Dr. Gala Romney   CC: Heart Failure   HPI: [name] is a [name] w/ h/o COVID 19 infection 03/2020, HTN, chronic renal insuffiency and new diagnosis of systolic HF.  Presented to [facility] on 07/13/20 with increased dyapnea and edema. Dyspnea started shortly after being diagnosed w/ COVID. ECHO showed severely reduced EF 20-25% and RV mildly reduced. Started on IV lasix. Transferred to [facility] on 07/18/20. R/LHC showed normal coronaries, severe NICM EF 15% w/ volume overload and preserved CO. GDMT further titrated. cMRI LVEF < 25% and moderately reduced RV -->c/w NICM. Discharged 07/20/20.   He was seen in HF clinic on 07/27/20 and was started on metoprolol xl but never started due to cost.  He continued on Entresto, Farxiga, and spironolactone. Also recommended to get sleep study. Limited income for medications.   He returned 6/22 for HF follow up. Last visit he had been out of meds for a week due to cost. Weight at home 221 pounds. NYHA I symptoms but volume was up. Daily lasix started.  Today he returns for HF follow up. Overall feeling fine. 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 Denies increasing SOB, CP, dizziness, edema, or PND/Orthopnea. Appetite ok. No fever or chills. Weight at home 223 pounds. Taking all medications. 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  Lives with his cousin.   Cardiac Studies   - Echo (4/22): EF 20-25% w/ global HK, mild LVH, RV moderately enlarged and systolic function mildly reduced. No significant valvular disease. Only mild MR and mild TR. RVSP normal at 23 mmHg.   R/LHC 07/19/20 Ao = 103/79 (89) LV = 102/22 RA =  11 RV = 50/12 PA = 42/17 (26) PCW = 14 Fick cardiac output/index = 7.9/3.4 PVR = 1.5 WU SVR = 791 Ao sat = 98% PA sat = 75%, 76% SVC sat - 75%  Assessment:  1. Normal coronary arteries 2.  Severe NICM EF 15% 3. Volume overload with preserved CO   cMRI 07/20/20 1. Moderately dilated LV with mild LVH, EF 23%.  2. Moderately dilated RV with EF 21%.  3. Nonspecific inferior RV insertion site LGE suggestive of pressure/volume overload.  4. Extracellular volume percentage not significantly elevated and T2 signal not high.  ROS: All systems negative except as listed in HPI, PMH and Problem List.  SH:  Social History   Socioeconomic History   Marital status: Single    Spouse name: Not on file   Number of children: Not on file   Years of education: Not on file   Highest education level: Not on file  Occupational History   Not on file  Tobacco Use   Smoking status: Never   Smokeless tobacco: Never  Vaping Use   Vaping Use: Never used  Substance and Sexual Activity   Alcohol use: Never   Drug use: Never   Sexual activity: Not on file  Other Topics Concern   Not on file  Social History Narrative   Not on file   Social Determinants of Health   Financial Resource Strain: Medium Risk   Difficulty of Paying Living Expenses: Somewhat hard  Food Insecurity: Not on file  Transportation Needs: No Transportation Needs   Lack of Transportation (Medical): No   Lack of Transportation (Non-Medical): No  Physical Activity: Not on file  Stress: Not on  file  Social Connections: Not on file  Intimate Partner Violence: Not on file   FH:  Family History  Problem Relation Age of Onset   Heart failure Neg Hx     Past Medical History:  Diagnosis Date   COVID-19 virus infection 03/2020   Hypertension    Renal insufficiency    Systolic heart failure (HCC) 06/2020   Current Outpatient Medications  Medication Sig Dispense Refill   dapagliflozin propanediol (FARXIGA) 10 MG TABS tablet Take 1 tablet (10 mg total) by mouth daily. 90 tablet 3   furosemide (LASIX) 40 MG tablet Take 1 tablet (40 mg total) by mouth daily. 30 tablet 2   potassium chloride SA (KLOR-CON) 20 MEQ  tablet Take 1 tablet (20 mEq total) by mouth daily as needed (when you take furosemide). 30 tablet 6   sacubitril-valsartan (ENTRESTO) 24-26 MG Take 1 tablet by mouth 2 (two) times daily. 180 tablet 1   spironolactone (ALDACTONE) 25 MG tablet Take 1 tablet (25 mg total) by mouth daily. 90 tablet 3   No current facility-administered medications for this encounter.   Blood pressure 126/80, pulse 72, weight 103.1 kg (227 lb 3.2 oz), SpO2 98 %. s  Wt Readings from Last 3 Encounters:  10/18/20 103.1 kg (227 lb 3.2 oz)  10/04/20 106.1 kg (233 lb 12.8 oz)  08/29/20 103.4 kg (228 lb)   PHYSICAL EXAM: General:  NAD. No resp difficulty. HEENT: Normal Neck: Supple. No JVD. Carotids 2+ bilat; no bruits. No lymphadenopathy or thryomegaly appreciated. Cor: PMI nondisplaced. Regular rate & rhythm. No rubs, gallops or murmurs. Lungs: Clear Abdomen: Soft, nontender, nondistended. No hepatosplenomegaly. No bruits or masses. Good bowel sounds. Extremities: No cyanosis, clubbing, rash, edema Neuro: Alert & oriented x 3, cranial nerves grossly intact. Moves all 4 extremities w/o difficulty. Affect pleasant.  Reds: 44%  ASSESSMENT & PLAN: 1. Chronic Systolic Heart Failure - 4/22 new diagnosis, Echo EF 20-25%, global HK, RV mildly reduced  - Cath 07/19/20 EF 15% normal cors. Preserved output. LVEDP 22 - CMRI 07/21/20  EF < 25 RV moderately reduced.  - NYHA I. Volume status stable on exam, weight down 6 lbs. REDs better at 44%, does not appear to be volume overloaded, ? Musculature chest/shoulders for elevatedREDs reading. - Start carvedilol 3.125 mg bid. - Continue lasix 40 mg daily. - Continue Entresto 24/26 mg bid. - Continue Farxiga 10 mg daily. - Continue spironolactone 25 mg daily. - Plan to repeat ECHO after HF optimized (QRS narrow, CRT-D not indicated).  - BMET today.   2. HTN:  - Controlled today. Add carvedilol as above. - Needs sleep study, but has no insurance.  3. Renal Insuffiencey -  Suspect possible HTN nephropathy. - Labs today.  4. Noncompliance with meds - Financial barrier. Referred to HFSW to assistance.  - Stressed the importance of medication compliance.  - Has PCP info, stressed importance of calling and making appt.  Greater than 50% of the (total minutes 25) visit spent in counseling/coordination of care regarding the above.   Follow up in 3-4 weeks with PharmD for further medication titration, likely can increase Entresto and change lasix to PRN; then follow back in 8 weeks with APP.  Prince Rome, FNP-BC 10/18/20

## 2020-10-18 ENCOUNTER — Encounter (HOSPITAL_COMMUNITY): Payer: Self-pay

## 2020-10-18 ENCOUNTER — Ambulatory Visit (HOSPITAL_COMMUNITY)
Admission: RE | Admit: 2020-10-18 | Discharge: 2020-10-18 | Disposition: A | Discharge status: Home / Self Care | Payer: PRIVATE HEALTH INSURANCE | Source: Ambulatory Visit | Attending: Family Medicine | Admitting: Family Medicine

## 2020-10-18 ENCOUNTER — Other Ambulatory Visit: Payer: Self-pay

## 2020-10-18 VITALS — BP 126/80 | HR 72 | Wt 227.2 lb

## 2020-10-18 DIAGNOSIS — N189 Chronic kidney disease, unspecified: Secondary | ICD-10-CM | POA: Diagnosis not present

## 2020-10-18 DIAGNOSIS — Z9114 Patient's other noncompliance with medication regimen: Secondary | ICD-10-CM | POA: Diagnosis not present

## 2020-10-18 DIAGNOSIS — I428 Other cardiomyopathies: Secondary | ICD-10-CM | POA: Diagnosis not present

## 2020-10-18 DIAGNOSIS — Z596 Low income: Secondary | ICD-10-CM | POA: Diagnosis not present

## 2020-10-18 DIAGNOSIS — I5022 Chronic systolic (congestive) heart failure: Secondary | ICD-10-CM | POA: Insufficient documentation

## 2020-10-18 DIAGNOSIS — Z8616 Personal history of COVID-19: Secondary | ICD-10-CM | POA: Insufficient documentation

## 2020-10-18 DIAGNOSIS — I129 Hypertensive chronic kidney disease with stage 1 through stage 4 chronic kidney disease, or unspecified chronic kidney disease: Secondary | ICD-10-CM | POA: Diagnosis not present

## 2020-10-18 DIAGNOSIS — Z79899 Other long term (current) drug therapy: Secondary | ICD-10-CM | POA: Diagnosis not present

## 2020-10-18 DIAGNOSIS — N289 Disorder of kidney and ureter, unspecified: Secondary | ICD-10-CM | POA: Diagnosis not present

## 2020-10-18 DIAGNOSIS — I10 Essential (primary) hypertension: Secondary | ICD-10-CM

## 2020-10-18 LAB — BASIC METABOLIC PANEL
Anion gap: 8 (ref 5–15)
BUN: 20 mg/dL (ref 6–20)
CO2: 27 mmol/L (ref 22–32)
Calcium: 9.7 mg/dL (ref 8.9–10.3)
Chloride: 102 mmol/L (ref 98–111)
Creatinine, Ser: 1.69 mg/dL — ABNORMAL HIGH (ref 0.61–1.24)
GFR, Estimated: 49 mL/min — ABNORMAL LOW (ref >60)
Glucose, Bld: 102 mg/dL — ABNORMAL HIGH (ref 70–99)
Potassium: 4.6 mmol/L (ref 3.5–5.1)
Sodium: 137 mmol/L (ref 135–145)

## 2020-10-18 MED ORDER — CARVEDILOL 3.125 MG PO TABS: 3.1250 mg | ORAL_TABLET | Freq: Two times a day (BID) | ORAL | 11 refills | Status: DC

## 2020-10-18 NOTE — Progress Notes (Signed)
ReDS Vest / Clip - 10/18/20 0800       ReDS Vest / Clip   Station Marker D    Ruler Value 32    ReDS Value Range High volume overload    ReDS Actual Value 44

## 2020-10-18 NOTE — Patient Instructions (Signed)
START Coreg 3.125mg  one tab twice daily  Labs today We will only contact you if something comes back abnormal or we need to make some changes. Otherwise no news is good news!  Your physician recommends that you schedule a follow-up appointment in: 8 weeks  in the Advanced Practitioners (PA/NP) Clinic    Do the following things EVERYDAY: Weigh yourself in the morning before breakfast. Write it down and keep it in a log. Take your medicines as prescribed Eat low salt foods--Limit salt (sodium) to 2000 mg per day.  Stay as active as you can everyday Limit all fluids for the day to less than 2 liters  At the [facility] Clinic, you and your health needs are our priority. As part of our continuing mission to provide you with exceptional heart care, we have created designated Provider Care Teams. These Care Teams include your primary Cardiologist (physician) and Advanced Practice Providers (APPs- Physician Assistants and Nurse Practitioners) who all work together to provide you with the care you need, when you need it.   You may see any of the following providers on your designated Care Team at your next follow up: Dr Arvilla Meres Dr Marca Ancona Dr Brandon Melnick, NP Robbie Lis, [place] Mikki Santee Karle Plumber, PharmD   Please be sure to bring in all your medications bottles to every appointment.

## 2020-11-09 ENCOUNTER — Encounter (HOSPITAL_COMMUNITY): Payer: Self-pay

## 2020-11-09 ENCOUNTER — Ambulatory Visit (HOSPITAL_COMMUNITY)
Admission: RE | Admit: 2020-11-09 | Discharge: 2020-11-09 | Disposition: A | Discharge status: Home / Self Care | Payer: 59 | Source: Ambulatory Visit | Attending: Cardiology | Admitting: Cardiology

## 2020-11-09 ENCOUNTER — Other Ambulatory Visit (HOSPITAL_COMMUNITY): Payer: Self-pay

## 2020-11-09 ENCOUNTER — Other Ambulatory Visit: Payer: Self-pay

## 2020-11-09 DIAGNOSIS — N289 Disorder of kidney and ureter, unspecified: Secondary | ICD-10-CM | POA: Insufficient documentation

## 2020-11-09 DIAGNOSIS — I110 Hypertensive heart disease with heart failure: Secondary | ICD-10-CM | POA: Diagnosis not present

## 2020-11-09 DIAGNOSIS — I509 Heart failure, unspecified: Secondary | ICD-10-CM | POA: Diagnosis present

## 2020-11-09 DIAGNOSIS — Z8616 Personal history of COVID-19: Secondary | ICD-10-CM | POA: Insufficient documentation

## 2020-11-09 DIAGNOSIS — I5022 Chronic systolic (congestive) heart failure: Secondary | ICD-10-CM | POA: Insufficient documentation

## 2020-11-09 HISTORY — DX: Chronic systolic (congestive) heart failure: I50.22

## 2020-11-09 LAB — BASIC METABOLIC PANEL
Anion gap: 9 (ref 5–15)
BUN: 21 mg/dL — ABNORMAL HIGH (ref 6–20)
CO2: 27 mmol/L (ref 22–32)
Calcium: 9.5 mg/dL (ref 8.9–10.3)
Chloride: 103 mmol/L (ref 98–111)
Creatinine, Ser: 1.6 mg/dL — ABNORMAL HIGH (ref 0.61–1.24)
GFR, Estimated: 52 mL/min — ABNORMAL LOW (ref >60)
Glucose, Bld: 117 mg/dL — ABNORMAL HIGH (ref 70–99)
Potassium: 4.6 mmol/L (ref 3.5–5.1)
Sodium: 139 mmol/L (ref 135–145)

## 2020-11-09 MED ORDER — ENTRESTO 49-51 MG PO TABS: 1.0000 | ORAL_TABLET | Freq: Two times a day (BID) | ORAL | 5 refills | Status: DC

## 2020-11-09 MED ORDER — FUROSEMIDE 40 MG PO TABS
40.0000 mg | ORAL_TABLET | Freq: Every day | ORAL | 2 refills | Status: DC | PRN
Start: 2020-11-09 — End: 2021-11-21

## 2020-11-09 NOTE — Progress Notes (Signed)
PCP: None Primary HF Cardiologist: Dr. Gala Romney  HPI:  [name] is a [name] w/ h/o COVID 19 infection 03/2020, HTN, chronic renal insufficiency and new diagnosis of systolic HF.   Presented to [facility] on 07/13/20 with increased dyapnea and edema. Dyspnea started shortly after being diagnosed w/ COVID. ECHO showed severely reduced EF 20-25% and RV mildly reduced. Started on IV lasix. Transferred to [facility] on 07/18/20. R/LHC showed normal coronaries, severe NICM EF 15% w/ volume overload and preserved CO. GDMT further titrated. cMRI LVEF < 25% and moderately reduced RV -->c/w NICM. Discharged 07/20/20.   He was seen in HF clinic on 07/27/20 and was started on metoprolol xl but never started due to cost. He continued on Entresto, Farxiga, and spironolactone. Also recommended to get sleep study. Limited income for medications.   He returned 6/22 for HF follow up. Weight at home 221 pounds. NYHA I symptoms but volume was up. Daily lasix started.   On 7/6 he returned for HF follow up. Overall felt fine. 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 Denied increasing SOB, CP, dizziness, edema, or PND/Orthopnea. Appetite ok. No fever or chills. Weight at home 223 pounds. Taking all medications. 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  Lives with his cousin.  Today he returns to HF clinic for pharmacist medication titration. At last visit with NP, carvedilol 3.125 mg BID was started. Overall, he is feeling good. Denied SOB, dizziness, lightheadedness, fatigue, chest pain, or palpitations. No PND, LEE, or orthopnea. Home weight has been stable. Appetite is good. Patient did not take meds this morning because he was just getting off work. He has been taking the furosemide as prn because he states that when it takes it every day, it gives him back pain. He only takes the KCl when he takes his furosemide. He does have insurance with [employer] now  and says he has had no problem with getting medications since then.   HF Medications: Carvedilol 3.125 mg BID Entresto 24-26 mg BID Spironolactone 25 mg daily  Farxiga 10 mg daily Furosemide 40 mg daily  Has the patient been experiencing any side effects to the medications prescribed? Yes - back pain when taking furosemide daily  Does the patient have any problems obtaining medications due to transportation or finances? No - has Nurse, learning disability through [employer]. Provided with commercial copay cards for [name] and Sherryll Burger to bring to pharmacy  Understanding of regimen: excellent Understanding of indications: excellent Potential of compliance: fair Patient understands to avoid NSAIDs. Patient understands to avoid decongestants.   Pertinent Lab Values: Serum creatinine 1.60, BUN 21, Potassium 4.6, Sodium 139  Vital Signs: Weight: 227 lbs (last clinic weight: 227 lbs) Blood pressure: 140/96 mmHg Heart rate: 73 bpm  Assessment/Plan: 1. Chronic Systolic Heart Failure - 4/22 new diagnosis, Echo EF 20-25%, global HK, RV mildly reduced - Cath 07/19/20 EF 15% normal cors. Preserved output. LVEDP 22 - CMRI 07/21/20  EF < 25 RV moderately reduced. - NYHA I. Volume status stable on exam. Does not appear to be volume overloaded - Continue carvedilol 3.125 mg BID - Increase to Entresto to 49-51 mg BID. BMET today. - Continue spironolactone 25 mg daily - Continue Farxiga 10 mg daily - Change furosemide to 40 mg daily PRN - Continue KCl 20 mEq daily PRN when taking furosemide - Plan to repeat ECHO after HF optimized (QRS narrow, CRT-D not indicated). -  Provided commercial copay cards for Sherryll Burger and [name] to bring to pharmacy    2. HTN: - Uncontrolled today. Did not take his medications this morning.  - Increase Entresto as above. - Continue carvedilol, spironolactone   3. 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  Follow up in 1 month with APP  Asencion Gowda PharmD/MBA Candidate [facility], Class of 2023  Sharen Hones, PharmD, BCPS Advanced [facility] Clinic Pharmacist [phone]

## 2020-11-09 NOTE — Patient Instructions (Signed)
It was a pleasure seeing you today!  MEDICATIONS: -We are changing your medications today -Increase Entresto to 1 tablet (49/51 mg) TWICE daily -Start taking lasix 1 tablet (40 mg) daily AS NEEDED  -Call if you have questions about your medications.  LABS: -We will call you if your labs need attention.  NEXT APPOINTMENT: Return to clinic in 1 month with HF NP/PA.  In general, to take care of your heart failure: -Limit your fluid intake to 2 Liters (half-gallon) per day.   -Limit your salt intake to ideally 2-3 grams (2000-3000 mg) per day. -Weigh yourself daily and record, and bring that "weight diary" to your next appointment.  (Weight gain of 2-3 pounds in 1 day typically means fluid weight.) -The medications for your heart are to help your heart and help you live longer.   -Please contact us before stopping any of your heart medications.  Call the clinic at [phone] with questions or to reschedule future appointments.

## 2020-12-12 NOTE — Progress Notes (Signed)
ADVANCED HEART FAILURE CLINIC NOTE  PCP: None  Primary HF Cardiologist: Dr. Gala Romney   CC: Heart Failure   HPI: Tony Meyer is a [name] w/ h/o COVID 19 infection 03/2020, HTN, chronic renal insuffiency and new diagnosis of systolic HF.  Presented to [facility] on 07/13/20 with increased dyapnea and edema. Dyspnea started shortly after being diagnosed w/ COVID. ECHO showed severely reduced EF 20-25% and RV mildly reduced. Started on IV lasix. Transferred to [facility] on 07/18/20. R/LHC showed normal coronaries, severe NICM EF 15% w/ volume overload and preserved CO. GDMT further titrated. cMRI LVEF < 25% and moderately reduced RV -->c/w NICM. Discharged 07/20/20.   At his follow up 4/22, he did not start his metoprolol due to cost. AHF follow up 6/22 he had been out of meds for a week due to cost.  Today he returns for HF follow up. He has cut back at work to now 6 days a week. 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 Denies dizziness, edema, or PND/Orthopnea. Appetite ok. Weight at home 225 pounds. Lives with his cousin. He now has insurance. He has been off of his medication for the past week because he got a text from his pharmacy that cost is $700. Takes prn lasix about once a week.  Cardiac Studies   - Echo (4/22): EF 20-25% w/ global HK, mild LVH, RV moderately enlarged and systolic function mildly reduced. No significant valvular disease. Only mild Tony and mild TR. RVSP normal at 23 mmHg.   R/LHC 07/19/20 Ao = 103/79 (89) LV = 102/22 RA =  11 RV = 50/12 PA = 42/17 (26) PCW = 14 Fick cardiac output/index = 7.9/3.4 PVR = 1.5 WU SVR = 791 Ao sat = 98% PA sat = 75%, 76% SVC sat - 75%   Assessment:  1. Normal coronary arteries 2. Severe NICM EF 15% 3. Volume overload with preserved CO   cMRI 07/20/20 1. Moderately dilated LV  with mild LVH, EF 23%.  2. Moderately dilated RV with EF 21%.  3. Nonspecific inferior RV insertion site LGE suggestive of pressure/volume overload.  4. Extracellular volume percentage not significantly elevated and T2 signal not high.  ROS: All systems negative except as listed in HPI, PMH and Problem List.  SH:  Social History   Socioeconomic History   Marital status: Single    Spouse name: Not on file   Number of children: Not on file   Years of education: Not on file   Highest education level: Not on file  Occupational History   Not on file  Tobacco Use   Smoking status: Never   Smokeless tobacco: Never  Vaping Use   Vaping Use: Never used  Substance and Sexual Activity   Alcohol use: Never   Drug use: Never   Sexual activity: Not on file  Other Topics Concern   Not on file  Social History Narrative   Not on file   Social Determinants of Health   Financial Resource Strain: Medium Risk   Difficulty of Paying Living Expenses: Somewhat hard  Food Insecurity: Not on file  Transportation Needs: No Transportation Needs   Lack of Transportation (Medical): No   Lack of Transportation (Non-Medical): No  Physical Activity: Not on file  Stress: Not on  file  Social Connections: Not on file  Intimate Partner Violence: Not on file   FH:  Family History  Problem Relation Age of Onset   Heart failure Neg Hx    Past Medical History:  Diagnosis Date   COVID-19 virus infection 03/2020   Hypertension    Renal insufficiency    Systolic heart failure (HCC) 06/2020   Current Outpatient Medications  Medication Sig Dispense Refill   carvedilol (COREG) 3.125 MG tablet Take 1 tablet (3.125 mg total) by mouth 2 (two) times daily. 60 tablet 11   dapagliflozin propanediol (FARXIGA) 10 MG TABS tablet Take 1 tablet (10 mg total) by mouth daily. 90 tablet 3   furosemide (LASIX) 40 MG tablet Take 1 tablet (40 mg total) by mouth daily as needed for fluid. 30 tablet 2   potassium  chloride SA (KLOR-CON) 20 MEQ tablet Take 1 tablet (20 mEq total) by mouth daily as needed (when you take furosemide). 30 tablet 6   sacubitril-valsartan (ENTRESTO) 49-51 MG Take 1 tablet by mouth 2 (two) times daily. 60 tablet 5   spironolactone (ALDACTONE) 25 MG tablet Take 1 tablet (25 mg total) by mouth daily. 90 tablet 3   No current facility-administered medications for this encounter.   Blood pressure (!) 118/98, pulse 62, weight 103.6 kg (228 lb 6.4 oz), SpO2 97 %.   Wt Readings from Last 3 Encounters:  12/13/20 103.6 kg (228 lb 6.4 oz)  11/09/20 103 kg (227 lb)  10/18/20 103.1 kg (227 lb 3.2 oz)   PHYSICAL EXAM: General:  NAD. No resp difficulty HEENT: Normal Neck: Supple. No JVD. Carotids 2+ bilat; no bruits. No lymphadenopathy or thryomegaly appreciated. Cor: PMI nondisplaced. Regular rate & rhythm. No rubs, gallops or murmurs. Lungs: Clear Abdomen: Soft, nontender, nondistended. No hepatosplenomegaly. No bruits or masses. Good bowel sounds. Extremities: No cyanosis, clubbing, rash, edema Neuro: Alert & oriented x 3, cranial nerves grossly intact. Moves all 4 extremities w/o difficulty. Affect pleasant.  ASSESSMENT & PLAN: 1. Chronic Systolic Heart Failure - 4/22 new diagnosis, Echo EF 20-25%, global HK, RV mildly reduced  - Cath 07/19/20 EF 15% normal cors. Preserved output. LVEDP 22 - cMRI 07/21/20  EF < 25%, RV moderately reduced.  - NYHA I-II. Volume good today. - Continue carvedilol 3.125 mg bid. - Continue lasix 40 mg PRN. - Continue Entresto 49/51 mg bid. - Continue Farxiga 10 mg daily. - Continue spironolactone 25 mg daily. - Repeat echo (QRS narrow, CRT-D not indicated).  - BMET today.  2. Atypical chest pain - Normal cors by cath (4/22). 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 - Sounds like MSK vs costochondritis. - Can try Tylenol for pain relief.  3. 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 - Baseline SCr 1.5. - Labs today.  4. HTN  -  Controlled today.  - Now has insurance, will arrange for sleep study.  5. Noncompliance with meds - He now has insurance. - I have reached out to our pharmacy tech for assistance. He was given samples & GoodRx card today.  - We discussed picking up all medications (that he can afford) and notifying clinic of any medications he cannot afford. - Stressed the importance of medication compliance.  - Has PCP info, stressed importance of calling and making appt.  Follow up with Dr. Gala Romney with echo in 2-3 months.  Tony Rome, FNP-BC 12/13/20 9:12 AM

## 2020-12-13 ENCOUNTER — Other Ambulatory Visit: Payer: Self-pay

## 2020-12-13 ENCOUNTER — Ambulatory Visit (HOSPITAL_COMMUNITY)
Admission: RE | Admit: 2020-12-13 | Discharge: 2020-12-13 | Disposition: A | Discharge status: Home / Self Care | Payer: 59 | Source: Ambulatory Visit | Attending: Family Medicine | Admitting: Family Medicine

## 2020-12-13 ENCOUNTER — Other Ambulatory Visit (HOSPITAL_COMMUNITY): Payer: Self-pay

## 2020-12-13 ENCOUNTER — Encounter (HOSPITAL_COMMUNITY): Payer: Self-pay

## 2020-12-13 VITALS — BP 118/98 | HR 62 | Wt 228.4 lb

## 2020-12-13 DIAGNOSIS — Z9114 Patient's other noncompliance with medication regimen: Secondary | ICD-10-CM | POA: Diagnosis not present

## 2020-12-13 DIAGNOSIS — N1831 Chronic kidney disease, stage 3a: Secondary | ICD-10-CM

## 2020-12-13 DIAGNOSIS — Z8249 Family history of ischemic heart disease and other diseases of the circulatory system: Secondary | ICD-10-CM | POA: Diagnosis not present

## 2020-12-13 DIAGNOSIS — I5022 Chronic systolic (congestive) heart failure: Secondary | ICD-10-CM

## 2020-12-13 DIAGNOSIS — Z8616 Personal history of COVID-19: Secondary | ICD-10-CM | POA: Diagnosis not present

## 2020-12-13 DIAGNOSIS — I10 Essential (primary) hypertension: Secondary | ICD-10-CM

## 2020-12-13 DIAGNOSIS — I130 Hypertensive heart and chronic kidney disease with heart failure and stage 1 through stage 4 chronic kidney disease, or unspecified chronic kidney disease: Secondary | ICD-10-CM | POA: Diagnosis present

## 2020-12-13 DIAGNOSIS — Z79899 Other long term (current) drug therapy: Secondary | ICD-10-CM | POA: Diagnosis not present

## 2020-12-13 DIAGNOSIS — R0789 Other chest pain: Secondary | ICD-10-CM

## 2020-12-13 DIAGNOSIS — Z7984 Long term (current) use of oral hypoglycemic drugs: Secondary | ICD-10-CM | POA: Insufficient documentation

## 2020-12-13 LAB — BASIC METABOLIC PANEL
Anion gap: 9 (ref 5–15)
BUN: 20 mg/dL (ref 6–20)
CO2: 26 mmol/L (ref 22–32)
Calcium: 9.6 mg/dL (ref 8.9–10.3)
Chloride: 104 mmol/L (ref 98–111)
Creatinine, Ser: 1.64 mg/dL — ABNORMAL HIGH (ref 0.61–1.24)
GFR, Estimated: 50 mL/min — ABNORMAL LOW (ref >60)
Glucose, Bld: 103 mg/dL — ABNORMAL HIGH (ref 70–99)
Potassium: 4 mmol/L (ref 3.5–5.1)
Sodium: 139 mmol/L (ref 135–145)

## 2020-12-13 NOTE — Progress Notes (Signed)
Medication Samples have been provided to the patient.  Drug name: entresto       Strength: 49/51        Qty: 28  LOT: [id number]  Exp.Date: 10/2022  Dosing instructions: one tab twice daily  The patient has been instructed regarding the correct time, dose, and frequency of taking this medication, including desired effects and most common side effects.   Tony Meyer 9:11 AM 12/13/2020   Medication Samples have been provided to the patient.  Drug name: farxiga       Strength: 10 mg         Qty: 28  LOT: [id number]  Exp.Date: 03/15/2023  Dosing instructions: one tab daily  The patient has been instructed regarding the correct time, dose, and frequency of taking this medication, including desired effects and most common side effects.   Tony Meyer 9:11 AM 12/13/2020

## 2020-12-13 NOTE — Patient Instructions (Signed)
It was great to see you today! No medication changes are needed at this time.  Labs today We will only contact you if something comes back abnormal or we need to make some changes. Otherwise no news is good news!  Your physician recommends that you schedule a follow-up appointment in: 3 months with Dr Gala Romney and echo  Your physician has requested that you have an echocardiogram. Echocardiography is a painless test that uses sound waves to create images of your heart. It provides your doctor with information about the size and shape of your heart and how well your heart's chambers and valves are working. This procedure takes approximately one hour. There are no restrictions for this procedure.   Do the following things EVERYDAY: Weigh yourself in the morning before breakfast. Write it down and keep it in a log. Take your medicines as prescribed Eat low salt foods--Limit salt (sodium) to 2000 mg per day.  Stay as active as you can everyday Limit all fluids for the day to less than 2 liters  At the [facility], you and your health needs are our priority. As part of our continuing mission to provide you with exceptional heart care, we have created designated Provider Care Teams. These Care Teams include your primary Cardiologist (physician) and Advanced Practice Providers (APPs- Physician Assistants and Nurse Practitioners) who all work together to provide you with the care you need, when you need it.   You may see any of the following providers on your designated Care Team at your next follow up: Dr Arvilla Meres Dr Marca Ancona Dr Brandon Melnick, NP Robbie Lis, [place] Mikki Santee Karle Plumber, PharmD   Please be sure to bring in all your medications bottles to every appointment.

## 2020-12-14 ENCOUNTER — Other Ambulatory Visit (HOSPITAL_COMMUNITY): Payer: Self-pay

## 2021-03-12 ENCOUNTER — Other Ambulatory Visit (HOSPITAL_COMMUNITY): Payer: Self-pay

## 2021-03-12 MED ORDER — DAPAGLIFLOZIN PROPANEDIOL 10 MG PO TABS: 10.0000 mg | ORAL_TABLET | Freq: Every day | ORAL | 3 refills | Status: DC

## 2021-03-15 ENCOUNTER — Other Ambulatory Visit: Payer: Self-pay

## 2021-03-15 ENCOUNTER — Ambulatory Visit (HOSPITAL_BASED_OUTPATIENT_CLINIC_OR_DEPARTMENT_OTHER)
Admission: RE | Admit: 2021-03-15 | Discharge: 2021-03-15 | Disposition: A | Discharge status: Home / Self Care | Payer: 59 | Source: Ambulatory Visit | Attending: Internal Medicine | Admitting: Internal Medicine

## 2021-03-15 ENCOUNTER — Ambulatory Visit (HOSPITAL_COMMUNITY)
Admission: RE | Admit: 2021-03-15 | Discharge: 2021-03-15 | Disposition: A | Discharge status: Home / Self Care | Payer: 59 | Source: Ambulatory Visit | Attending: Internal Medicine | Admitting: Internal Medicine

## 2021-03-15 DIAGNOSIS — I130 Hypertensive heart and chronic kidney disease with heart failure and stage 1 through stage 4 chronic kidney disease, or unspecified chronic kidney disease: Secondary | ICD-10-CM | POA: Diagnosis not present

## 2021-03-15 DIAGNOSIS — I5022 Chronic systolic (congestive) heart failure: Secondary | ICD-10-CM | POA: Insufficient documentation

## 2021-03-15 LAB — ECHOCARDIOGRAM COMPLETE
Area-P 1/2: 4.06 cm2
S' Lateral: 4.3 cm

## 2021-03-15 NOTE — Progress Notes (Signed)
Did not show for visit. Note left for templating purposes only           ADVANCED HEART FAILURE CLINIC NOTE  PCP: None  Primary HF Cardiologist: Dr. Gala Romney   CC: Heart Failure   HPI: Tony Meyer is a [name] w/ h/o COVID 19 infection 03/2020, HTN, chronic renal insuffiency and new diagnosis of systolic HF.  Presented to [facility] on 07/13/20 with increased dyapnea and edema. Dyspnea started shortly after being diagnosed w/ COVID. ECHO showed severely reduced EF 20-25% and RV mildly reduced. Started on IV lasix. Transferred to [facility] on 07/18/20. R/LHC showed normal coronaries, severe NICM EF 15% w/ volume overload and preserved CO. GDMT further titrated. cMRI LVEF < 25% and moderately reduced RV -->c/w NICM. Discharged 07/20/20.   At his follow up 4/22, he did not start his metoprolol due to cost. AHF follow up 6/22 he had been out of meds for a week due to cost.  Today he returns for HF follow up.   Echo today 03/15/21 EF 40-45% Personally reviewed   Cardiac Studies   - Echo (4/22): EF 20-25% w/ global HK, mild LVH, RV moderately enlarged and systolic function mildly reduced. No significant valvular disease. Only mild Tony and mild TR. RVSP normal at 23 mmHg.   R/LHC 07/19/20 Ao = 103/79 (89) LV = 102/22 RA =  11 RV = 50/12 PA = 42/17 (26) PCW = 14 Fick cardiac output/index = 7.9/3.4 PVR = 1.5 WU SVR = 791 Ao sat = 98% PA sat = 75%, 76% SVC sat - 75%   Assessment:  1. Normal coronary arteries 2. Severe NICM EF 15% 3. Volume overload with preserved CO   cMRI 07/20/20 1. Moderately dilated LV with mild LVH, EF 23%.  2. Moderately dilated RV with EF 21%.  3. Nonspecific inferior RV insertion site LGE suggestive of pressure/volume overload.  4. Extracellular volume percentage not significantly elevated and T2 signal not high.  ROS: All systems negative except as listed in HPI, PMH and Problem List.  SH:  Social History   Socioeconomic History   Marital status: Single     Spouse name: Not on file   Number of children: Not on file   Years of education: Not on file   Highest education level: Not on file  Occupational History   Not on file  Tobacco Use   Smoking status: Never   Smokeless tobacco: Never  Vaping Use   Vaping Use: Never used  Substance and Sexual Activity   Alcohol use: Never   Drug use: Never   Sexual activity: Not on file  Other Topics Concern   Not on file  Social History Narrative   Not on file   Social Determinants of Health   Financial Resource Strain: Medium Risk   Difficulty of Paying Living Expenses: Somewhat hard  Food Insecurity: Not on file  Transportation Needs: No Transportation Needs   Lack of Transportation (Medical): No   Lack of Transportation (Non-Medical): No  Physical Activity: Not on file  Stress: Not on file  Social Connections: Not on file  Intimate Partner Violence: Not on file   FH:  Family History  Problem Relation Age of Onset   Heart failure Neg Hx    Past Medical History:  Diagnosis Date   COVID-19 virus infection 03/2020   Hypertension    Renal insufficiency    Systolic heart failure (HCC) 06/2020   Current Outpatient Medications  Medication Sig Dispense  Refill   carvedilol (COREG) 3.125 MG tablet Take 1 tablet (3.125 mg total) by mouth 2 (two) times daily. 60 tablet 11   dapagliflozin propanediol (FARXIGA) 10 MG TABS tablet Take 1 tablet (10 mg total) by mouth daily. 90 tablet 3   furosemide (LASIX) 40 MG tablet Take 1 tablet (40 mg total) by mouth daily as needed for fluid. 30 tablet 2   potassium chloride SA (KLOR-CON) 20 MEQ tablet Take 1 tablet (20 mEq total) by mouth daily as needed (when you take furosemide). 30 tablet 6   sacubitril-valsartan (ENTRESTO) 49-51 MG Take 1 tablet by mouth 2 (two) times daily. 60 tablet 5   spironolactone (ALDACTONE) 25 MG tablet Take 1 tablet (25 mg total) by mouth daily. 90 tablet 3   No current facility-administered medications for this encounter.    There were no vitals taken for this visit.   Wt Readings from Last 3 Encounters:  12/13/20 103.6 kg (228 lb 6.4 oz)  11/09/20 103 kg (227 lb)  10/18/20 103.1 kg (227 lb 3.2 oz)   PHYSICAL EXAM: General:  Well appearing. No resp difficulty HEENT: normal Neck: supple. no JVD. Carotids 2+ bilat; no bruits. No lymphadenopathy or thryomegaly appreciated. Cor: PMI nondisplaced. Regular rate & rhythm. No rubs, gallops or murmurs. Lungs: clear Abdomen: soft, nontender, nondistended. No hepatosplenomegaly. No bruits or masses. Good bowel sounds. Extremities: no cyanosis, clubbing, rash, edema Neuro: alert & orientedx3, cranial nerves grossly intact. moves all 4 extremities w/o difficulty. Affect pleasant   ASSESSMENT & PLAN: 1. Chronic Systolic Heart Failure - 4/22 new diagnosis, Echo EF 20-25%, global HK, RV mildly reduced  - Cath 07/19/20 EF 15% normal cors. Preserved output. LVEDP 22 - cMRI 07/21/20  EF < 25%, RV moderately reduced.  - Echo today 03/15/21: EF 40-45% - NYHA I-II. Volume good today. - Continue carvedilol 3.125 mg bid. - Continue lasix 40 mg PRN. - Continue Entresto 49/51 mg bid. - Continue Farxiga 10 mg daily. - Continue spironolactone 25 mg daily. - Labs today  2. Atypical chest pain - Normal cors by cath (4/22). - Suspect musculoskeletal  3. 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 - Baseline SCr 1.5-1.6. - Continue ARNI & SGLT - Labs today  4. HTN  - Controlled today.  - Now has insurance, will arrange for sleep study.  5. Noncompliance with meds - He now has insurance. - I have reached out to our pharmacy tech for assistance. He was given samples & GoodRx card today.  - We discussed picking up all medications (that he can afford) and notifying clinic of any medications he cannot afford. - Stressed the importance of medication compliance.  - Has PCP info, stressed importance of calling and making appt.   Tony Meres, MD  10:40 AM  03/15/21 10:40  AM

## 2021-03-28 ENCOUNTER — Telehealth (HOSPITAL_COMMUNITY): Payer: Self-pay | Admitting: *Deleted

## 2021-03-28 NOTE — Telephone Encounter (Signed)
New auth for home sleep study faxed to [place]

## 2021-04-10 ENCOUNTER — Telehealth (HOSPITAL_COMMUNITY): Payer: Self-pay | Admitting: Pharmacist

## 2021-04-10 NOTE — Telephone Encounter (Signed)
Patient Advocate Encounter   Received notification from [place] that prior authorization for [name] is required.   PA submitted on CoverMyMeds Key [id number] Status is pending   Will continue to follow.   Karle Plumber, PharmD, BCPS, BCCP, CPP Heart Failure Clinic Pharmacist [phone]

## 2021-04-11 ENCOUNTER — Other Ambulatory Visit (HOSPITAL_COMMUNITY): Payer: Self-pay

## 2021-04-11 NOTE — Telephone Encounter (Signed)
Advanced Heart Failure Patient Advocate Encounter  Prior Authorization for [name] has been approved.    PA# [id number] Effective dates: 04/10/21 through 04/14/2098  Karle Plumber, PharmD, BCPS, BCCP, CPP Heart Failure Clinic Pharmacist [phone]

## 2021-08-08 ENCOUNTER — Other Ambulatory Visit (HOSPITAL_COMMUNITY): Payer: Self-pay | Admitting: Cardiology

## 2021-08-08 MED ORDER — CARVEDILOL 3.125 MG PO TABS: 3.1250 mg | ORAL_TABLET | Freq: Two times a day (BID) | ORAL | 0 refills | Status: DC

## 2021-08-08 MED ORDER — ENTRESTO 49-51 MG PO TABS: 1.0000 | ORAL_TABLET | Freq: Two times a day (BID) | ORAL | 0 refills | Status: DC

## 2021-10-09 ENCOUNTER — Other Ambulatory Visit (HOSPITAL_COMMUNITY): Payer: Self-pay | Admitting: Adult Health

## 2021-10-16 IMAGING — MR MR CARD MORPHOLOGY WO/W CM
45 of 48 series · 45 of 48 positions shown · IV contrast (Contrast agent)
Comparison: none

CLINICAL DATA: Cardiomyopathy of uncertain etiology

EXAM:
CARDIAC MRI
TECHNIQUE: The patient was scanned on a 1.5 Tesla GE magnet. A dedicated
cardiac coil was used. Functional imaging was done using Fiesta
sequences. [DATE], and 4 chamber views were done to assess for RWMA's.
Modified Campero rule using a short axis stack was used to
calculate an ejection fraction on a dedicated work station using
Circle software. The patient received 10 cc of Gadavist. After 10
minutes inversion recovery sequences were used to assess for
infiltration and scar tissue.
CONTRAST:  Gadavist 10 cc

[Series 4: [id number] · axial · 8.0mm · 1.41mm/px · 1 of 17 slices shown]
[im 1/17]
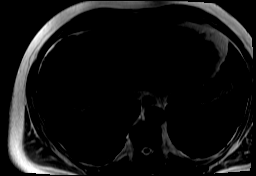

[Series 8: bSSFP · oblique · 8.0mm · 1.61mm/px · 1 of 25 slices shown (1 of 24)]
[im 1/25]
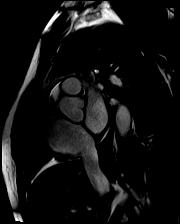

[Series 9: bSSFP · oblique · 8.0mm · 1.61mm/px · 1 of 25 slices shown (2 of 24)]
[im 1/25]
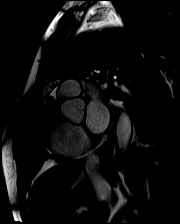

[Series 10: bSSFP · oblique · 8.0mm · 1.61mm/px · 1 of 25 slices shown (3 of 24)]
[im 1/25]
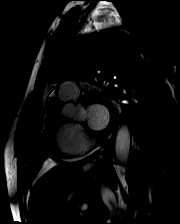

[Series 11: bSSFP · oblique · 8.0mm · 1.61mm/px · 1 of 25 slices shown (4 of 24)]
[im 1/25]
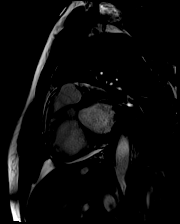

[Series 12: bSSFP · oblique · 8.0mm · 1.61mm/px · 1 of 25 slices shown (5 of 24)]
[im 1/25]
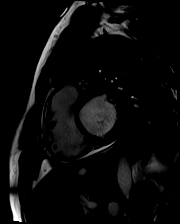

[Series 13: bSSFP · oblique · 8.0mm · 1.61mm/px · 1 of 25 slices shown (6 of 24)]
[im 1/25]
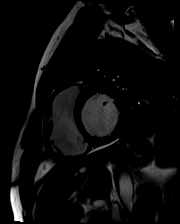

[Series 14: bSSFP · oblique · 8.0mm · 1.61mm/px · 1 of 25 slices shown (7 of 24)]
[im 1/25]
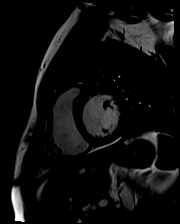

[Series 15: bSSFP · oblique · 8.0mm · 1.61mm/px · 1 of 25 slices shown (8 of 24)]
[im 1/25]
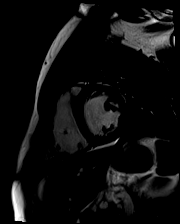

[Series 16: bSSFP · oblique · 8.0mm · 1.61mm/px · 1 of 25 slices shown (9 of 24)]
[im 1/25]
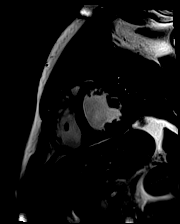

[Series 17: bSSFP · oblique · 8.0mm · 1.61mm/px · 1 of 25 slices shown (10 of 24)]
[im 1/25]
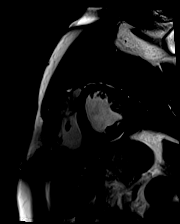

[Series 18: bSSFP · oblique · 8.0mm · 1.61mm/px · 1 of 25 slices shown (11 of 24)]
[im 1/25]
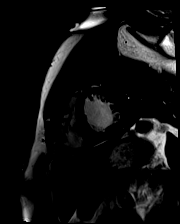

[Series 19: bSSFP · oblique · 8.0mm · 1.61mm/px · 1 of 25 slices shown (12 of 24)]
[im 1/25]
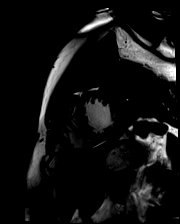

[Series 20: bSSFP · oblique · 8.0mm · 1.61mm/px · 1 of 25 slices shown (13 of 24)]
[im 1/25]
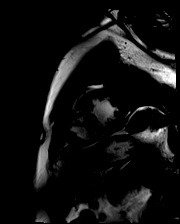

[Series 21: bSSFP · oblique · 8.0mm · 1.61mm/px · 1 of 25 slices shown (14 of 24)]
[im 1/25]
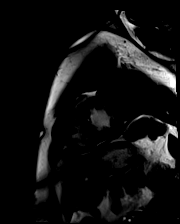

[Series 22: bSSFP · oblique · 8.0mm · 1.61mm/px · 1 of 25 slices shown (15 of 24)]
[im 1/25]
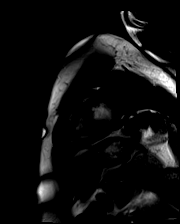

[Series 23: bSSFP · oblique · 8.0mm · 1.61mm/px · 1 of 25 slices shown (16 of 24)]
[im 1/25]
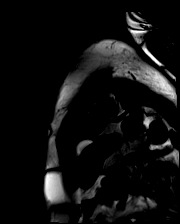

[Series 24: bSSFP · oblique · 8.0mm · 1.61mm/px · 1 of 25 slices shown (17 of 24)]
[im 1/25]
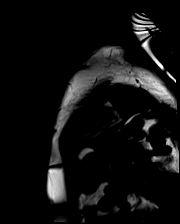

[Series 25: bSSFP · oblique · 8.0mm · 1.61mm/px · 1 of 25 slices shown (18 of 24)]
[im 1/25]
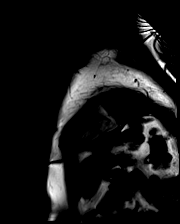

[Series 26: bSSFP · oblique · 8.0mm · 1.61mm/px · 1 of 25 slices shown (19 of 24)]
[im 1/25]
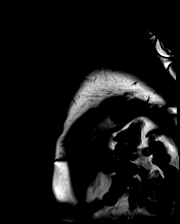

[Series 27: bSSFP · oblique · 8.0mm · 1.61mm/px · 1 of 25 slices shown (20 of 24)]
[im 1/25]
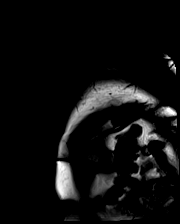

[Series 28: bSSFP · oblique · 6.0mm · 1.41mm/px · 1 of 25 slices shown (21 of 24)]
[im 1/25]
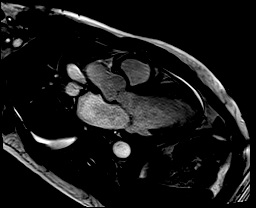

[Series 29: bSSFP · axial · 6.0mm · 1.41mm/px · 1 of 25 slices shown (22 of 24)]
[im 1/25]
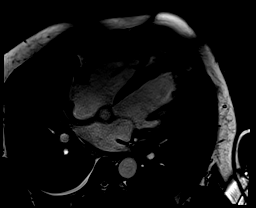

[Series 30: bSSFP · oblique · 6.0mm · 1.41mm/px · 1 of 25 slices shown (23 of 24)]
[im 1/25]
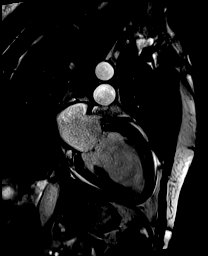

[Series 31: (id)_long_t1 · oblique · 8.0mm · 1.56mm/px · 1 of 24 slices shown]
[im 1/24]
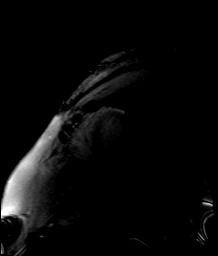

[Series 32: (id)_long_t1_moco · oblique · 8.0mm · 1.56mm/px · 1 of 24 slices shown]
[im 1/24]
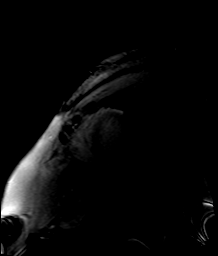

[Series 35: (id)_trufi · oblique · 8.0mm · 2.08mm/px · 1 of 9 slices shown]
[im 1/9]
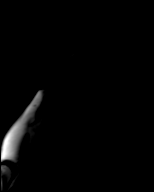

[Series 36: (id)_trufi_moco · oblique · 8.0mm · 2.08mm/px · 1 of 9 slices shown]
[im 1/9]
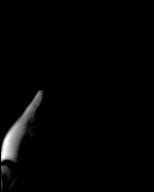

[Series 39: pre short axis · oblique · non-contrast · 8.0mm · 2.25mm/px · 1 of 10 slices shown (1 of 6)]
[im 1/10]
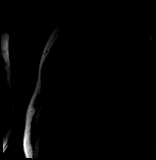

[Series 40: pre short axis · oblique · non-contrast · 8.0mm · 2.25mm/px · 1 of 10 slices shown (2 of 6)]
[im 1/10]
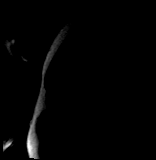

[Series 41: pre short axis · oblique · non-contrast · 8.0mm · 2.25mm/px · 1 of 10 slices shown (3 of 6)]
[im 1/10]
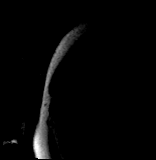

[Series 42: pre short axis · oblique · non-contrast · 8.0mm · 2.25mm/px · 1 of 10 slices shown (4 of 6)]
[im 1/10]
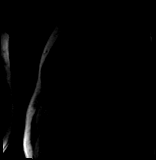

[Series 43: pre short axis · oblique · non-contrast · 8.0mm · 2.25mm/px · 1 of 10 slices shown (5 of 6)]
[im 1/10]
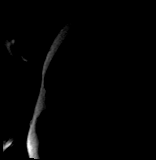

[Series 44: pre short axis · oblique · non-contrast · 8.0mm · 2.25mm/px · 1 of 10 slices shown (6 of 6)]
[im 1/10]
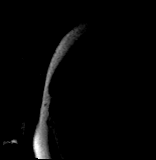

[Series 45: rest short axis · oblique · 8.0mm · 2.25mm/px · 1 of 60 slices shown (1 of 6)]
[im 1/60]
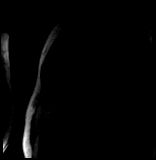

[Series 46: rest short axis · oblique · 8.0mm · 2.25mm/px · 1 of 60 slices shown (2 of 6)]
[im 1/60]
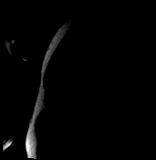

[Series 47: rest short axis · oblique · 8.0mm · 2.25mm/px · 1 of 60 slices shown (3 of 6)]
[im 1/60]
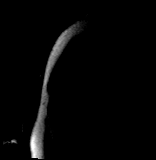

[Series 48: rest short axis · oblique · 8.0mm · 2.25mm/px · 1 of 60 slices shown (4 of 6)]
[im 1/60]
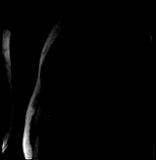

[Series 49: rest short axis · oblique · 8.0mm · 2.25mm/px · 1 of 60 slices shown (5 of 6)]
[im 1/60]
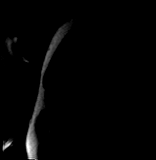

[Series 50: rest short axis · oblique · 8.0mm · 2.25mm/px · 1 of 60 slices shown (6 of 6)]
[im 1/60]
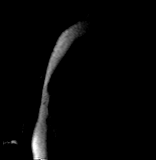

[Series 51: bSSFP · coronal · 6.0mm · 1.41mm/px · 1 of 25 slices shown (24 of 24)]
[im 1/25]
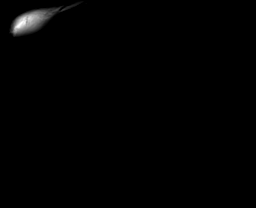

[Series 52: cine rvit · coronal · 6.0mm · 1.41mm/px · 1 of 25 slices shown]
[im 1/25]
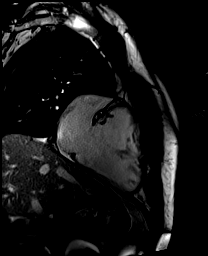

[Series 53: aortic valve cine · oblique · 6.0mm · 1.41mm/px · 1 of 25 slices shown]
[im 1/25]
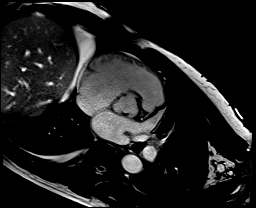

[Series 54: cine rvot · sagittal · 6.0mm · 1.41mm/px · 1 of 25 slices shown]
[im 1/25]
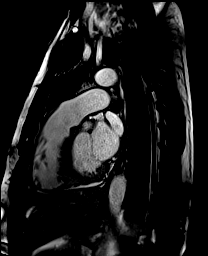

[Series 56: lge_single shot sa · oblique · 8.0mm · 2.08mm/px · 1 of 18 slices shown]
[im 1/18]
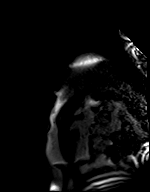

[45 of 48 positions shown; findings below may reference images not displayed]

FINDINGS: Trivial bilateral pleural effusions.

Moderately dilated left ventricle with mild concentric LV
hypertrophy. Diffuse LV hypokinesis, EF 23%. No LV thrombus.
Moderately dilated right ventricle, EF 21%. Mild biatrial
enlargement. Trileaflet aortic valve, no significant stenosis or
regurgitation. No significant mitral regurgitation noted.

On delayed enhancement imaging, there was a small area of
subepicardial late gadolinium enhancement (LGE) at the inferoseptal
RV insertion site.

Measurements:

LVEDV 296 mL

LVSV 67 mL
LVEF 23%

RVEDV 311 mL
RVSV 66 mL
RVEF 21%

T2 45 in septum and lateral wall (normal range)

ECV 30%
IMPRESSION: 1.  Moderately dilated LV with mild LVH, EF 23%.

2.  Moderately dilated RV with EF 21%.

3. Nonspecific inferior RV insertion site LGE suggestive of
pressure/volume overload.

4. Extracellular volume percentage not significantly elevated and T2
signal not high.

Romie Locklear

## 2021-11-20 ENCOUNTER — Other Ambulatory Visit (HOSPITAL_COMMUNITY): Payer: Self-pay | Admitting: Cardiology

## 2021-11-20 ENCOUNTER — Other Ambulatory Visit (HOSPITAL_COMMUNITY): Payer: Self-pay | Admitting: Adult Health

## 2021-12-17 ENCOUNTER — Other Ambulatory Visit (HOSPITAL_COMMUNITY): Payer: Self-pay | Admitting: Cardiology

## 2022-01-29 ENCOUNTER — Other Ambulatory Visit (HOSPITAL_COMMUNITY): Payer: Self-pay | Admitting: Cardiology

## 2022-02-03 ENCOUNTER — Other Ambulatory Visit (HOSPITAL_COMMUNITY): Payer: Self-pay | Admitting: Cardiology

## 2022-02-03 ENCOUNTER — Other Ambulatory Visit (HOSPITAL_COMMUNITY): Payer: Self-pay | Admitting: Family Medicine

## 2022-03-13 ENCOUNTER — Other Ambulatory Visit (HOSPITAL_COMMUNITY): Payer: Self-pay | Admitting: Cardiology

## 2022-03-13 ENCOUNTER — Other Ambulatory Visit (HOSPITAL_COMMUNITY): Payer: Self-pay | Admitting: Adult Health

## 2022-04-10 ENCOUNTER — Other Ambulatory Visit (HOSPITAL_COMMUNITY): Payer: Self-pay | Admitting: Family Medicine

## 2022-04-10 ENCOUNTER — Other Ambulatory Visit (HOSPITAL_COMMUNITY): Payer: Self-pay | Admitting: Cardiology

## 2022-05-04 ENCOUNTER — Other Ambulatory Visit (HOSPITAL_COMMUNITY): Payer: Self-pay | Admitting: Cardiology

## 2022-05-22 ENCOUNTER — Other Ambulatory Visit (HOSPITAL_COMMUNITY): Payer: Self-pay | Admitting: Cardiology

## 2022-06-21 ENCOUNTER — Other Ambulatory Visit (HOSPITAL_COMMUNITY): Payer: Self-pay | Admitting: Family Medicine

## 2022-06-21 ENCOUNTER — Other Ambulatory Visit (HOSPITAL_COMMUNITY): Payer: Self-pay | Admitting: Cardiology

## 2022-06-21 ENCOUNTER — Other Ambulatory Visit (HOSPITAL_COMMUNITY): Payer: Self-pay | Admitting: Adult Health

## 2022-07-17 ENCOUNTER — Other Ambulatory Visit (HOSPITAL_COMMUNITY): Payer: Self-pay | Admitting: Cardiology

## 2022-07-17 ENCOUNTER — Other Ambulatory Visit (HOSPITAL_COMMUNITY): Payer: Self-pay | Admitting: Family Medicine

## 2022-07-31 ENCOUNTER — Other Ambulatory Visit (HOSPITAL_COMMUNITY): Payer: Self-pay | Admitting: Cardiology

## 2022-08-26 ENCOUNTER — Other Ambulatory Visit (HOSPITAL_COMMUNITY): Payer: Self-pay | Admitting: Cardiology

## 2022-09-23 ENCOUNTER — Other Ambulatory Visit (HOSPITAL_COMMUNITY): Payer: Self-pay | Admitting: Cardiology

## 2022-11-16 ENCOUNTER — Other Ambulatory Visit (HOSPITAL_COMMUNITY): Payer: Self-pay | Admitting: Cardiology

## 2023-01-19 ENCOUNTER — Other Ambulatory Visit (HOSPITAL_COMMUNITY): Payer: Self-pay | Admitting: Cardiology

## 2023-02-27 ENCOUNTER — Other Ambulatory Visit (HOSPITAL_COMMUNITY): Payer: Self-pay

## 2023-04-20 ENCOUNTER — Other Ambulatory Visit (HOSPITAL_COMMUNITY): Payer: Self-pay | Admitting: Cardiology

## 2023-04-21 ENCOUNTER — Other Ambulatory Visit (HOSPITAL_COMMUNITY): Payer: Self-pay | Admitting: Cardiology

## 2023-04-26 ENCOUNTER — Inpatient Hospital Stay (HOSPITAL_COMMUNITY)
Admission: EM | Admit: 2023-04-26 | Discharge: 2023-04-30 | DRG: 286 | Disposition: A | Discharge status: Home / Self Care | Payer: 59 | Attending: Family Medicine | Admitting: Family Medicine

## 2023-04-26 ENCOUNTER — Emergency Department (HOSPITAL_COMMUNITY): Payer: 59

## 2023-04-26 ENCOUNTER — Encounter: Payer: Self-pay | Admitting: Family Medicine

## 2023-04-26 ENCOUNTER — Encounter (HOSPITAL_COMMUNITY): Payer: Self-pay | Admitting: *Deleted

## 2023-04-26 ENCOUNTER — Other Ambulatory Visit: Payer: Self-pay

## 2023-04-26 DIAGNOSIS — R7309 Other abnormal glucose: Secondary | ICD-10-CM

## 2023-04-26 DIAGNOSIS — E861 Hypovolemia: Secondary | ICD-10-CM | POA: Diagnosis present

## 2023-04-26 DIAGNOSIS — J180 Bronchopneumonia, unspecified organism: Secondary | ICD-10-CM

## 2023-04-26 DIAGNOSIS — Z5986 Financial insecurity: Secondary | ICD-10-CM

## 2023-04-26 DIAGNOSIS — N179 Acute kidney failure, unspecified: Secondary | ICD-10-CM | POA: Diagnosis present

## 2023-04-26 DIAGNOSIS — Z7984 Long term (current) use of oral hypoglycemic drugs: Secondary | ICD-10-CM

## 2023-04-26 DIAGNOSIS — R0600 Dyspnea, unspecified: Secondary | ICD-10-CM | POA: Diagnosis present

## 2023-04-26 DIAGNOSIS — I130 Hypertensive heart and chronic kidney disease with heart failure and stage 1 through stage 4 chronic kidney disease, or unspecified chronic kidney disease: Secondary | ICD-10-CM | POA: Diagnosis not present

## 2023-04-26 DIAGNOSIS — I959 Hypotension, unspecified: Secondary | ICD-10-CM | POA: Diagnosis present

## 2023-04-26 DIAGNOSIS — Z79899 Other long term (current) drug therapy: Secondary | ICD-10-CM

## 2023-04-26 DIAGNOSIS — N281 Cyst of kidney, acquired: Secondary | ICD-10-CM | POA: Diagnosis present

## 2023-04-26 DIAGNOSIS — I5023 Acute on chronic systolic (congestive) heart failure: Secondary | ICD-10-CM | POA: Diagnosis not present

## 2023-04-26 DIAGNOSIS — E1122 Type 2 diabetes mellitus with diabetic chronic kidney disease: Secondary | ICD-10-CM | POA: Diagnosis present

## 2023-04-26 DIAGNOSIS — J101 Influenza due to other identified influenza virus with other respiratory manifestations: Secondary | ICD-10-CM | POA: Diagnosis present

## 2023-04-26 DIAGNOSIS — Z1152 Encounter for screening for COVID-19: Secondary | ICD-10-CM

## 2023-04-26 DIAGNOSIS — J9601 Acute respiratory failure with hypoxia: Secondary | ICD-10-CM | POA: Diagnosis present

## 2023-04-26 DIAGNOSIS — N1831 Chronic kidney disease, stage 3a: Secondary | ICD-10-CM | POA: Diagnosis present

## 2023-04-26 DIAGNOSIS — M545 Low back pain, unspecified: Secondary | ICD-10-CM | POA: Diagnosis present

## 2023-04-26 DIAGNOSIS — J1008 Influenza due to other identified influenza virus with other specified pneumonia: Secondary | ICD-10-CM | POA: Diagnosis present

## 2023-04-26 DIAGNOSIS — I428 Other cardiomyopathies: Secondary | ICD-10-CM | POA: Insufficient documentation

## 2023-04-26 DIAGNOSIS — E1165 Type 2 diabetes mellitus with hyperglycemia: Secondary | ICD-10-CM | POA: Diagnosis present

## 2023-04-26 DIAGNOSIS — R7989 Other specified abnormal findings of blood chemistry: Secondary | ICD-10-CM | POA: Diagnosis present

## 2023-04-26 DIAGNOSIS — Z8616 Personal history of COVID-19: Secondary | ICD-10-CM

## 2023-04-26 DIAGNOSIS — I083 Combined rheumatic disorders of mitral, aortic and tricuspid valves: Secondary | ICD-10-CM | POA: Diagnosis present

## 2023-04-26 DIAGNOSIS — I5A Non-ischemic myocardial injury (non-traumatic): Secondary | ICD-10-CM | POA: Diagnosis present

## 2023-04-26 DIAGNOSIS — Z66 Do not resuscitate: Secondary | ICD-10-CM | POA: Diagnosis present

## 2023-04-26 HISTORY — DX: Other abnormal glucose: R73.09

## 2023-04-26 HISTORY — DX: Chronic kidney disease, unspecified: N18.9

## 2023-04-26 HISTORY — DX: Other cardiomyopathies: I42.8

## 2023-04-26 LAB — CBC
HCT: 44.3 % (ref 39.0–52.0)
Hemoglobin: 14.9 g/dL (ref 13.0–17.0)
MCH: 29.7 pg (ref 26.0–34.0)
MCHC: 33.6 g/dL (ref 30.0–36.0)
MCV: 88.2 fL (ref 80.0–100.0)
Platelets: 158 10*3/uL (ref 150–400)
RBC: 5.02 MIL/uL (ref 4.22–5.81)
RDW: 14.1 % (ref 11.5–15.5)
WBC: 5.4 10*3/uL (ref 4.0–10.5)
nRBC: 0 % (ref 0.0–0.2)

## 2023-04-26 LAB — RESP PANEL BY RT-PCR (RSV, FLU A&B, COVID)  RVPGX2
Influenza A by PCR: POSITIVE — AB
Influenza B by PCR: NEGATIVE
Resp Syncytial Virus by PCR: NEGATIVE
SARS Coronavirus 2 by RT PCR: NEGATIVE

## 2023-04-26 LAB — BASIC METABOLIC PANEL
Anion gap: 12 (ref 5–15)
BUN: 24 mg/dL — ABNORMAL HIGH (ref 6–20)
CO2: 20 mmol/L — ABNORMAL LOW (ref 22–32)
Calcium: 8.4 mg/dL — ABNORMAL LOW (ref 8.9–10.3)
Chloride: 101 mmol/L (ref 98–111)
Creatinine, Ser: 2.01 mg/dL — ABNORMAL HIGH (ref 0.61–1.24)
GFR, Estimated: 39 mL/min — ABNORMAL LOW (ref >60)
Glucose, Bld: 132 mg/dL — ABNORMAL HIGH (ref 70–99)
Potassium: 4.4 mmol/L (ref 3.5–5.1)
Sodium: 133 mmol/L — ABNORMAL LOW (ref 135–145)

## 2023-04-26 LAB — HEPATIC FUNCTION PANEL
ALT: 288 U/L — ABNORMAL HIGH (ref 0–44)
AST: 159 U/L — ABNORMAL HIGH (ref 15–41)
Albumin: 3.2 g/dL — ABNORMAL LOW (ref 3.5–5.0)
Alkaline Phosphatase: 44 U/L (ref 38–126)
Bilirubin, Direct: 0.2 mg/dL (ref 0.0–0.2)
Indirect Bilirubin: 0.5 mg/dL (ref 0.3–0.9)
Total Bilirubin: 0.7 mg/dL (ref 0.0–1.2)
Total Protein: 6.4 g/dL — ABNORMAL LOW (ref 6.5–8.1)

## 2023-04-26 LAB — BRAIN NATRIURETIC PEPTIDE: B Natriuretic Peptide: 584.7 pg/mL — ABNORMAL HIGH (ref 0.0–100.0)

## 2023-04-26 LAB — TROPONIN I (HIGH SENSITIVITY): Troponin I (High Sensitivity): 97 ng/L — ABNORMAL HIGH (ref <18)

## 2023-04-26 LAB — LACTIC ACID, PLASMA: Lactic Acid, Venous: 1.3 mmol/L (ref 0.5–1.9)

## 2023-04-26 LAB — I-STAT CG4 LACTIC ACID, ED: Lactic Acid, Venous: 1.2 mmol/L (ref 0.5–1.9)

## 2023-04-26 LAB — HIV ANTIBODY (ROUTINE TESTING W REFLEX): HIV Screen 4th Generation wRfx: NONREACTIVE

## 2023-04-26 MED ORDER — IPRATROPIUM-ALBUTEROL 0.5-2.5 (3) MG/3ML IN SOLN
3.0000 mL | Freq: Once | RESPIRATORY_TRACT | Status: AC
  Administered 2023-04-26: 3 mL via RESPIRATORY_TRACT
  Filled 2023-04-26: qty 3

## 2023-04-26 MED ORDER — HYDRALAZINE HCL 10 MG PO TABS: 10.0000 mg | ORAL_TABLET | Freq: Three times a day (TID) | ORAL | Status: DC

## 2023-04-26 MED ORDER — FUROSEMIDE 10 MG/ML IJ SOLN
60.0000 mg | Freq: Two times a day (BID) | INTRAMUSCULAR | Status: DC
  Administered 2023-04-26 – 2023-04-28 (×4): 60 mg via INTRAVENOUS
  Filled 2023-04-26 (×4): qty 6

## 2023-04-26 MED ORDER — ENOXAPARIN SODIUM 40 MG/0.4ML IJ SOSY
40.0000 mg | PREFILLED_SYRINGE | INTRAMUSCULAR | Status: DC
  Administered 2023-04-26 – 2023-04-29 (×4): 40 mg via SUBCUTANEOUS
  Filled 2023-04-26 (×4): qty 0.4

## 2023-04-26 MED ORDER — FUROSEMIDE 10 MG/ML IJ SOLN
40.0000 mg | Freq: Once | INTRAMUSCULAR | Status: AC
  Administered 2023-04-26: 40 mg via INTRAVENOUS
  Filled 2023-04-26: qty 4

## 2023-04-26 MED ORDER — DAPAGLIFLOZIN PROPANEDIOL 10 MG PO TABS
10.0000 mg | ORAL_TABLET | Freq: Every day | ORAL | Status: DC
  Administered 2023-04-27: 10 mg via ORAL
  Filled 2023-04-26: qty 1

## 2023-04-26 MED ORDER — HYDRALAZINE HCL 10 MG PO TABS: 10.0000 mg | ORAL_TABLET | Freq: Once | ORAL | Status: DC

## 2023-04-26 NOTE — Assessment & Plan Note (Signed)
 LFTs elevated likely d/t vascular congestion. -AM CMP -Hepatitis panel

## 2023-04-26 NOTE — Hospital Course (Addendum)
 Tony Meyer is a 53 y.o.male with a history of HFrEF, HTN, and T2DM who was admitted to the [facility] at [facility] for shortness of breath 2/2 acute CHF in setting of influenza. His hospital course is detailed below:  Acute on Chronic CHF Exacerbation Presented with increased shortness of breath with exertion and at rest, orthopnea, and distended abdomen.  BNP elevated to 584.7, troponin 97>109, then trended flat.  CXR demonstrated patchy perihilar and bibasilar airspace opacities (pulmonary edema vs multifocal PNA).  Diuresed with IV Lasix  60 mg twice daily as tolerated.  Treated with DuoNebs every 4 hours as needed.  Held Coreg , spironolactone , and Entresto , but continued Farxiga  in the setting of soft blood pressures and AKI.  Cardiology was consulted and recommended diuresis, holding off on GDMT due to renal function and soft BPs. Echo demonstrated LVEF 30-35%, LV global hypokinesis, mild concentric LVH, G2DD, and mild-moderate MVR. R heart cath demonstrated moderate-severely reduced cardiac index. Cardiology advised starting Losartan  12.5 mg nightly, Lasix  as needed, and restart spironolactone  outpatient due to hypovolemia.   AKI Creatinine 2.04 upon admission, likely 2/2 third spacing and continuing diuresis.  RUQ US  performed due to distended abdomen and low back pain, this revealed simple cysts within the right kidney.  Influenza A Influenza positive on RVP.  Tamiflu  30 mg twice daily (1/12-1/17).  Other chronic conditions were medically managed with home medications and formulary alternatives as necessary: T2DM: A1c 6.5.  Monitored with CBGs  PCP Follow-up Recommendations: If Respiratory status improves and then acutely worsens, recommend starting antibiotics for bacterial pneumonia (recent Influenza infection).  Restart spironolactone  outpatient due to hypovolemia.

## 2023-04-26 NOTE — ED Provider Notes (Signed)
 [place] EMERGENCY DEPARTMENT AT [facility] Provider Note   CSN: [id number] Arrival date & time: 04/26/23  1011     History  Chief Complaint  Patient presents with   Shortness of Breath    Tony Meyer is a 52 y.o. male.  53 yo M with a chief complaints of shortness of breath.  This been going on for couple years.  He feels like its gotten a bit worse over the past few days.  He denies chest pain or pressure.    Shortness of Breath      Home Medications Prior to Admission medications   Medication Sig Start Date End Date Taking? Authorizing Provider  carvedilol  (COREG ) 3.125 MG tablet TAKE 1 TABLET BY MOUTH TWICE DAILY WITH A MEAL . APPOINTMENT REQUIRED FOR FUTURE REFILLS 04/22/23   Bensimhon, Toribio SAUNDERS, MD  ENTRESTO  49-51 MG TAKE 1 TABLET BY MOUTH TWICE DAILY. APPOINTMENT REQUIRED FOR FUTURE REFILLS 09/23/22   Rolan Barrack RAMAN, MD  FARXIGA  10 MG TABS tablet TAKE 1 TABLET BY MOUTH ONCE DAILY. APPOINTMENT NEEDED FOR FUTURE REFILLS 04/22/23   Bensimhon, Toribio SAUNDERS, MD  furosemide  (LASIX ) 40 MG tablet TAKE 1 TABLET BY MOUTH ONCE DAILY AS NEEDED 01/30/22   Rolan Barrack RAMAN, MD  furosemide  (LASIX ) 40 MG tablet TAKE 1 TABLET BY MOUTH ONCE DAILY . APPOINTMENT REQUIRED FOR FUTURE REFILLS 04/22/23   Bensimhon, Toribio SAUNDERS, MD  potassium chloride  SA (KLOR-CON  M) 20 MEQ tablet TAKE 1 TABLET BY MOUTH ONCE DAILY AS NEEDED WHEN  TAKING  FUROSEMIDE . NEED APPOINTMENT FOR FURTHER REFILLS. 08/27/22   Rolan Barrack RAMAN, MD  spironolactone  (ALDACTONE ) 25 MG tablet TAKE 1 TABLET BY MOUTH ONCE DAILY. PATIENT NEEDS TO BE SEEN IN OFFICE FOR FURTHER REFILLS 04/22/23   Bensimhon, Toribio SAUNDERS, MD      Allergies    Patient has no known allergies.    Review of Systems   Review of Systems  Respiratory:  Positive for shortness of breath.     Physical Exam Updated Vital Signs BP 107/62   Pulse 100   Temp 98.3 F (36.8 C)   Resp (!) 32   Ht 6' 2 (1.88 m)   Wt 113.4 kg   SpO2 92%   BMI 32.10 kg/m   Physical Exam Vitals and nursing note reviewed.  Constitutional:      Appearance: He is well-developed.  HENT:     Head: Normocephalic and atraumatic.  Eyes:     Pupils: Pupils are equal, round, and reactive to light.  Neck:     Vascular: No JVD.  Cardiovascular:     Rate and Rhythm: Normal rate and regular rhythm.     Heart sounds: No murmur heard.    No friction rub. No gallop.  Pulmonary:     Effort: No respiratory distress.     Breath sounds: Examination of the right-lower field reveals rales. Examination of the left-lower field reveals rales. Wheezing and rales present.  Abdominal:     General: There is no distension.     Tenderness: There is no abdominal tenderness. There is no guarding or rebound.  Musculoskeletal:        General: Normal range of motion.     Cervical back: Normal range of motion and neck supple.     Right lower leg: Edema present.     Left lower leg: Edema present.  Skin:    Coloration: Skin is not pale.     Findings: No rash.  Neurological:  Mental Status: He is alert and oriented to person, place, and time.  Psychiatric:        Behavior: Behavior normal.     ED Results / Procedures / Treatments   Labs (all labs ordered are listed, but only abnormal results are displayed) Labs Reviewed  BASIC METABOLIC PANEL - Abnormal; Notable for the following components:      Result Value   Sodium 133 (*)    CO2 20 (*)    Glucose, Bld 132 (*)    BUN 24 (*)    Creatinine, Ser 2.01 (*)    Calcium  8.4 (*)    GFR, Estimated 39 (*)    All other components within normal limits  BRAIN NATRIURETIC PEPTIDE - Abnormal; Notable for the following components:   B Natriuretic Peptide 584.7 (*)    All other components within normal limits  TROPONIN I (HIGH SENSITIVITY) - Abnormal; Notable for the following components:   Troponin I (High Sensitivity) 97 (*)    All other components within normal limits  CBC    EKG EKG Interpretation Date/Time:  Saturday  April 26 2023 10:18:31 EST Ventricular Rate:  101 PR Interval:  176 QRS Duration:  94 QT Interval:  330 QTC Calculation: 427 R Axis:   65  Text Interpretation: Sinus tachycardia Right atrial enlargement Minimal voltage criteria for LVH, may be normal variant ( Sokolow-Lyon ) Borderline ECG Since last tracing rate faster Otherwise no significant change Confirmed by Emil Share [phone]) on 04/26/2023 12:57:45 PM  Radiology DG Chest 2 View Result Date: 04/26/2023 CLINICAL DATA:  Shortness of breath EXAM: CHEST - 2 VIEW COMPARISON:  07/17/2020 FINDINGS: Stable cardiomegaly. Patchy perihilar and bibasilar airspace opacities. No pleural effusion or pneumothorax. IMPRESSION: Patchy perihilar and bibasilar airspace opacities, which may represent pulmonary edema or multifocal pneumonia. Electronically Signed   By: Mabel Converse D.O.   On: 04/26/2023 10:55    Procedures Procedures    Medications Ordered in ED Medications  ipratropium-albuterol  (DUONEB) 0.5-2.5 (3) MG/3ML nebulizer solution 3 mL (3 mLs Nebulization Given 04/26/23 1127)  furosemide  (LASIX ) injection 40 mg (40 mg Intravenous Given 04/26/23 1215)    ED Course/ Medical Decision Making/ A&P                                 Medical Decision Making Amount and/or Complexity of Data Reviewed Labs: ordered. Radiology: ordered.  Risk Prescription drug management. Decision regarding hospitalization.   53 yo M with a chief complaint of difficulty breathing.  Going on for a couple years he tells me.  No tachypnea at rest.  Tells me is very short of breath when he gets up and ambulates.  He does have signs on exam of both heart failure and perhaps bronchospasm.  Will give neb here.  Lasix .  Chest x-ray independently interpreted by me with likely pulmonary edema.  Radiology read with possible multifocal pneumonia.  Not febrile not coughing.  Think infection is less likely.  I reassessed the patient he is having good urine output after a  bolus dose of Lasix .  He still feels very short of breath and in fact is quite winded just sitting up in the bed.  I think he benefit from more diuresis as an inpatient.  I discussed with cardiology Dr. Sheena, will formally consult.  Discussed with medicine for admission.  The patients results and plan were reviewed and discussed.   Any x-rays performed were independently  reviewed by myself.   Differential diagnosis were considered with the presenting HPI.  Medications  ipratropium-albuterol  (DUONEB) 0.5-2.5 (3) MG/3ML nebulizer solution 3 mL (3 mLs Nebulization Given 04/26/23 1127)  furosemide  (LASIX ) injection 40 mg (40 mg Intravenous Given 04/26/23 1215)    Vitals:   04/26/23 1058 04/26/23 1300 04/26/23 1400 04/26/23 1415  BP: (!) 92/42 122/81 107/62   Pulse: 60 (!) 104 100 100  Resp: (!) 30 (!) 24 18 (!) 32  Temp:   98.3 F (36.8 C)   SpO2: 98% 93% 97% 92%  Weight:      Height:        Final diagnoses:  Acute on chronic systolic congestive heart failure (HCC)    Admission/ observation were discussed with the admitting physician, patient and/or family and they are comfortable with the plan.          Final Clinical Impression(s) / ED Diagnoses Final diagnoses:  Acute on chronic systolic congestive heart failure [facility])    Rx / DC Orders ED Discharge Orders     None         Emil Share, DO 04/26/23 1429

## 2023-04-26 NOTE — Assessment & Plan Note (Addendum)
 On presentation, initial troponin elevated to 97. EKG without ischemic findings. No current chest pain.  - Trend troponin - Echo ordered - Serial EKG as needed

## 2023-04-26 NOTE — Assessment & Plan Note (Addendum)
 Chronic, worsening dyspnea most likely secondary to poorly-controlled heart failure. On presentation, BNP elevated to 584.7 (although prior values have been in the 1000s), CXR with possible edema. Intermittent hypoxia to 85-86 on RA in room. Previous R/L cath with EF<15%, LVEDP 22 and cMRI with moderately reduced RV in April 2022. Last echo with improved EF 40-45% in Dec 2022. Patient received IV lasix  in the ED with some moderate output. Home meds of Coreg  3.125 BID, Farxiga  10 daily, Lasix  40 daily (not taking daily), Potassium 20 with Lasix , Spironolactone  25 mg daily, and Entresto  49-51 mg BID. - Admit to Progressive with FMTS and attending Dr McDiarmid - ED consulted cardiology, greatly appreciate their recs - S/p 40 IV lasix , redose as indicated - Holding home GDMT at this time d/t soft BPs - Echo ordered - Continuous cardiac monitoring - Strict I/Os - Daily weights - Vital signs per floor  - AM BMP, Mg with goal K>4, Mg>2

## 2023-04-26 NOTE — ED Triage Notes (Signed)
 C/o sob  when ask how long its has been going on all he will say is a long time , I just can't take it any more very poor historian. Ask what was different today he said he works all the time hasn't had a chance to come

## 2023-04-26 NOTE — Assessment & Plan Note (Addendum)
 On presentation, Cr 2.01. Baseline around 1.5-1.6. Most likely secondary to fluid overload.  - Diurese as tolerated  - AM BMP, Mg with goal K>4, Mg>2

## 2023-04-26 NOTE — Consult Note (Addendum)
 Cardiology Consultation   Patient ID: REISS MOWREY MRN: [id number]; DOB: December 09, 1970  Admit date: 04/26/2023 Date of Consult: 04/26/2023  PCP:  Patient, No Pcp Per   [facility] Providers Cardiologist:  Alvan Carrier, MD        Patient Profile:   Tony Meyer is a 53 y.o. 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  History of Present Illness:   Tony Meyer was diagnosed with systolic heart failure in March 2022 after presenting to [facility] with shortness of breath and edema.  Echo at that time showed severely reduced LVEF of 2025%.  He was transferred to [facility] on 07/18/2020.  Right and left heart catheterization were performed which showed normal coronaries, elevated intracardiac filling pressures and preserved cardiac output.  He was started on GDMT.  Cardiac MRI was performed which showed moderately dilated LV with EF 23%, no significant LGE, consistent with a nonischemic etiology.  Patient was last seen by Dr. Cherrie in December 2022.  He has been lost to follow-up since.  He states that he continues to take his heart failure medications, except for the Entresto  which was too expensive.  Over the past 1 year he reports gradual clinical decline.  He has been short of breath for approximately 1 year in duration, but now is short of breath even at rest and with very minimal activity.  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  He states that he accumulates fluid in his abdomen.  He takes Lasix  as needed.   Past Medical History:  Diagnosis Date   Acute heart failure (HCC) 07/14/2020   Acute systolic heart failure (HCC) 07/18/2020   AKI (acute kidney injury) (HCC) 03/15/2020   CHF exacerbation (HCC) 07/13/2020    Chronic kidney disease    Chronic systolic heart failure (HCC) 11/09/2020   COVID-19 virus infection 03/2020   Elevated hemoglobin A1c measurement 04/26/2023   6.7% in 2022     Essential hypertension 03/15/2020   Hypertension    Nonischemic cardiomyopathy (HCC) 04/26/2023   Pneumonia due to COVID-19 virus 03/14/2020   Renal insufficiency    Systolic heart failure (HCC) 06/2020    Past Surgical History:  Procedure Laterality Date   HERNIA REPAIR     RIGHT/LEFT HEART CATH AND CORONARY ANGIOGRAPHY N/A 07/19/2020   Procedure: RIGHT/LEFT HEART CATH AND CORONARY ANGIOGRAPHY;  Surgeon: Cherrie Toribio SAUNDERS, MD;  Location: [facility] INVASIVE CV LAB;  Service: Cardiovascular;  Laterality: N/A;     Inpatient Medications: Scheduled Meds:  enoxaparin  (LOVENOX ) injection  40 mg Subcutaneous Q24H   furosemide   60 mg Intravenous Q12H   Continuous Infusions:  PRN Meds:   Allergies:   No Known Allergies  Social History:   Social History   Socioeconomic History   Marital status: Single    Spouse name: Not on file   Number of children: Not on file   Years of education: Not on file   Highest education level: Not on file  Occupational History   Not on file  Tobacco Use   Smoking status: Never   Smokeless tobacco: Never  Vaping Use   Vaping status: Never Used  Substance and Sexual Activity   Alcohol use: Never   Drug use: Never   Sexual  activity: Not on file  Other Topics Concern   Not on file  Social History Narrative   Not on file   Social Drivers of Health   Financial Resource Strain: Medium Risk (07/19/2020)   Overall Financial Resource Strain (CARDIA)    Difficulty of Paying Living Expenses: Somewhat hard  Food Insecurity: Not on file  Transportation Needs: No Transportation Needs (07/19/2020)   PRAPARE - Administrator, Civil Service (Medical): No    Lack of Transportation (Non-Medical): No  Physical Activity: Not on file  Stress: Not on file  Social Connections: Not  on file  Intimate Partner Violence: Not on file    Family History:   Family History  Problem Relation Age of Onset   Heart failure Neg Hx      ROS:  Please see the history of present illness.   All other ROS reviewed and negative.     Physical Exam/Data:   Vitals:   04/26/23 1300 04/26/23 1400 04/26/23 1415 04/26/23 1446  BP: 122/81 107/62  106/70  Pulse: (!) 104 100 100 (!) 102  Resp: (!) 24 18 (!) 32 (!) 24  Temp:  98.3 F (36.8 C)    SpO2: 93% 97% 92% 90%  Weight:      Height:        Intake/Output Summary (Last 24 hours) at 04/26/2023 1558 Last data filed at 04/26/2023 1451 Gross per 24 hour  Intake --  Output 500 ml  Net -500 ml      04/26/2023   10:20 AM 12/13/2020    8:37 AM 11/09/2020   11:17 AM  Last 3 Weights  Weight (lbs) 250 lb 228 lb 6.4 oz 227 lb  Weight (kg) 113.399 kg 103.602 kg 102.967 kg     Body mass index is 32.1 kg/m.  General:  Well nourished, well developed, in no acute distress HEENT: normal Neck: JVD elevated Cardiac: Normal rate and regular rhythm Lungs: Mildly increased work of breathing, bibasilar rales Abd: soft, no hepatomegaly  Ext: no edema Musculoskeletal:  No deformities, BUE and BLE strength normal and equal Skin: warm and dry  Neuro:  CNs 2-12 intact, no focal abnormalities noted Psych:  Normal affect   EKG:  The EKG was personally reviewed and demonstrates: Sinus tachycardia.   Relevant CV Studies: 03/2021 Echo:   1. Left ventricular ejection fraction, by estimation, is 40 to 45%. The  left ventricle has mildly decreased function. The left ventricle has no  regional wall motion abnormalities. There is mild left ventricular  hypertrophy. Left ventricular diastolic  parameters are consistent with Grade I diastolic dysfunction (impaired  relaxation).   2. Right ventricular systolic function is normal. The right ventricular  size is normal.   3. The mitral valve is normal in structure. No evidence of mitral valve   regurgitation. No evidence of mitral stenosis.   4. The aortic valve is normal in structure. Aortic valve regurgitation is  trivial. No aortic stenosis is present.   5. The inferior vena cava is normal in size with greater than 50%  respiratory variability, suggesting right atrial pressure of 3 mmHg.   07/20/20 cMRI:  IMPRESSION: 1.  Moderately dilated LV with mild LVH, EF 23%.   2.  Moderately dilated RV with EF 21%.   3. Nonspecific inferior RV insertion site LGE suggestive of pressure/volume overload.   4. Extracellular volume percentage not significantly elevated and T2 signal not high.  RHC/LHC 07/20/2020: 1. Normal coronary arteries 2. Severe NICM EF  15% 3. Volume overload with preserved CO  Laboratory Data:  High Sensitivity Troponin:   Recent Labs  Lab 04/26/23 1023  TROPONINIHS 97*     Chemistry Recent Labs  Lab 04/26/23 1023  NA 133*  K 4.4  CL 101  CO2 20*  GLUCOSE 132*  BUN 24*  CREATININE 2.01*  CALCIUM  8.4*  GFRNONAA 39*  ANIONGAP 12    No results for input(s): PROT, ALBUMIN, AST, ALT, ALKPHOS, BILITOT in the last 168 hours. Lipids No results for input(s): CHOL, TRIG, HDL, LABVLDL, LDLCALC, CHOLHDL in the last 168 hours.  Hematology Recent Labs  Lab 04/26/23 1023  WBC 5.4  RBC 5.02  HGB 14.9  HCT 44.3  MCV 88.2  MCH 29.7  MCHC 33.6  RDW 14.1  PLT 158   Thyroid  No results for input(s): TSH, FREET4 in the last 168 hours.  BNP Recent Labs  Lab 04/26/23 1023  BNP 584.7*    DDimer No results for input(s): DDIMER in the last 168 hours.   Radiology/Studies:  DG Chest 2 View Result Date: 04/26/2023 CLINICAL DATA:  Shortness of breath EXAM: CHEST - 2 VIEW COMPARISON:  07/17/2020 FINDINGS: Stable cardiomegaly. Patchy perihilar and bibasilar airspace opacities. No pleural effusion or pneumothorax. IMPRESSION: Patchy perihilar and bibasilar airspace opacities, which may represent pulmonary edema or multifocal  pneumonia. Electronically Signed   By: Mabel Converse D.O.   On: 04/26/2023 10:55     Assessment and Plan:  Tony Meyer is a 53 y.o. 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  Unfortunately, patient was lost to follow-up and has not been seen since 03/2021.  His EF had initially improved with GDMT from 23% to 40%.  Is unclear how adherent to GDMT he has been over the past 2 years.  He has definitively stopped Entresto  due to cost.  He presents today with likely acute decompensated heart failure.  He does not appear to have significant volume overload (no peripheral edema), although does endorse some abdominal distention.  Additionally, he is tachycardic with soft blood pressures, which is concerning for a low output state.  #.  Acute on chronic systolic heart failure: #.  Nonischemic cardiomyopathy: -We will check LFTs and lactate to look for signs of endorgan dysfunction.  Creatinine is slightly elevated from baseline but could just be secondary to vascular congestion. -Diuresis with IV Lasix  to achieve euvolemia.  Monitor urine output and uptitrate accordingly. -Will initiate and uptitrate GDMT as BP allows.  Would start first with afterload reduction.  Would hold off on beta-blocker for now given soft BPs would hold off on spironolactone  for now given AKI.  We can continue Farxiga . -Consideration for right heart cath early next week. -Repeat echocardiogram.  #.  Elevated troponin: Low suspicion for ACS.  Likely related to acute decompensated heart failure. -Trend high-sensitivity troponin.   For questions or updates, please contact [facility] Please consult [email or web address] for contact info under    Signed, Fonda Kitty, MD  04/26/2023 3:58 PM

## 2023-04-26 NOTE — ED Notes (Signed)
 Called lab to add BNP and troponin.

## 2023-04-26 NOTE — H&P (Addendum)
 Hospital Admission History and Physical Service Pager: [phone]  Patient name: Tony Meyer Medical record number: [id number] Date of Birth: 1970-10-28 Age: 53 y.o. Gender: male  Primary Care Provider: Patient, No Pcp Per Consultants: Cardiology Code Status: DNR/DNI Preferred Emergency Contact: Tony Meyer (Friend): [phone]  Chief Complaint: Shortness of breath   Assessment and Plan: Tony Meyer is a 53 y.o. male PMH HFmrEF, HTN presenting with worsening shortness of breath. Differential for presentation of this includes:   ADHF: Most likely etiology given limited compliance with GDMT, orthopnea, elevated BNP and CXR findings suggestive of edema.  PNA: Possible component given opacities on CXR, though afebrile without leukocytosis. States he had a cough however none on exam today.  URI: Possible component given rhinorrhea and sore throat. Add viral swab. COPD: Unlikely given no smoking history or personal hx of asthma/COPD Assessment & Plan Dyspnea  HmrEF Chronic, worsening dyspnea most likely secondary to poorly-controlled heart failure. On presentation, BNP elevated to 584.7 (although prior values have been in the 1000s), CXR with possible edema. Intermittent hypoxia to 85-86 on RA in room. Previous R/L cath with EF<15%, LVEDP 22 and cMRI with moderately reduced RV in April 2022. Last echo with improved EF 40-45% in Dec 2022. Patient received IV lasix  in the ED with some moderate output. Home meds of Coreg  3.125 BID, Farxiga  10 daily, Lasix  40 daily (not taking daily), Potassium 20 with Lasix , Spironolactone  25 mg daily, and Entresto  49-51 mg BID. - Admit to Progressive with FMTS and attending Dr McDiarmid - ED consulted cardiology, greatly appreciate their recs - S/p 40 IV lasix , redose as indicated - Holding home GDMT at this time d/t soft BPs - Echo ordered - Continuous cardiac monitoring - Strict I/Os - Daily weights - Vital signs per floor  - AM BMP, Mg  with goal K>4, Mg>2 Elevated troponin On presentation, initial troponin elevated to 97. EKG without ischemic findings. No current chest pain.  - Trend troponin - Echo ordered - Serial EKG as needed  AKI on CKD3a On presentation, Cr 2.01. Baseline around 1.5-1.6. Most likely secondary to fluid overload.  - Diurese as tolerated  - AM BMP, Mg with goal K>4, Mg>2 Elevated LFTs LFTs elevated likely d/t vascular congestion. -AM CMP -Hepatitis panel  Chronic and Stable Problems:  HTN-GDMT meds as described in 1st problem (holding d/t BP)  FEN/GI: Heart healthy VTE Prophylaxis: lovenox   Disposition: Admit to FMTS on Progressive with attending Dr McDiarmid  History of Present Illness:  Tony Meyer is a 53 y.o. male presenting with dyspnea worsening over the past 3 years. Presenting today due to inability to tolerate symptoms.   Patient has been getting increasingly short of breath lately, noting he is short of breath even at rest. He works in [employer] requiring heavy lifting and exertion, and it has become increasingly difficult to maintain this work. He reports left sided chest pain when exerting (like pushing boxes at work) but none at rest. No radiation of chest pain elsewhere. No current chest pain. He uses two pillows to sleep at night, and notes his abdomen has been feeling more swollen. Yesterday he had episodes of vomiting, with some dark red blood content, no coffee ground appearance. No recent fevers or sick contacts. Has had intermittent congestion, sore throat, and cough productive of nonbloody mucus. Denies any missed doses of medicines. States he does not take lasix  every day as it makes his back hurt.   In the ED, patient  afebrile, stable on room air, with soft BPs and intermittent desats to mid 80s. BNP 584.7, Troponin 97 > , Cr 2.01, CXR with bibasilar opacities, EKG without ischemic findings. Received 40 IV lasix  and Duonebs x1.   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   Pertinent Past Surgical History: R/L cath (2022) Hernia repair - long time ago  Remainder reviewed in history tab.   Pertinent Social History: Tobacco use: never Alcohol use: none Other Substance use: none Lives with cousins  Pertinent Family History: HTN-mom and dad's side  Remainder reviewed in history tab.   Important Outpatient Medications: Carvedilol  3.125 BID Entresto  49-51 BID - not taking due to cost Farxiga  10 daily  Lasix  40 PRN - takes about 1-2 a week Spironolactone  25 daily   Remainder reviewed in medication history.   Objective: BP 122/81   Pulse (!) 104   Temp 98.2 F (36.8 C)   Resp (!) 24   Ht 6' 2 (1.88 m)   Wt 113.4 kg   SpO2 93%   BMI 32.10 kg/m  Exam: General: No acute distress. Shallow breathing.  CV: Soft S1/S2. No extra heart sounds. Warm and well-perfused. No significant JVD. Pulm: Shallow breathing on room air. Diffuse crackles throughout. Abdominal breathing present.  Abd: BS present. Distended. Nontender.  Ext: No extremity edema, erythema, or tenderness.  Skin:  Warm, dry. Psych: Pleasant and appropriate.    Labs:  CBC BMET  Recent Labs  Lab 04/26/23 1023  WBC 5.4  HGB 14.9  HCT 44.3  PLT 158   Recent Labs  Lab 04/26/23 1023  NA 133*  K 4.4  CL 101  CO2 20*  BUN 24*  CREATININE 2.01*  GLUCOSE 132*  CALCIUM  8.4*     BNP    Component Value Date/Time   BNP 584.7 (H) 04/26/2023 1023   Troponin HS: 97 >   EKG: No ischemic findings.   Imaging Studies Performed: CXR: Patchy perihilar and bibasilar airspace opacities, which may represent pulmonary edema or multifocal pneumonia.   Diona Perkins, MD 04/26/2023, 1:59 PM PGY-1, [facility]  FPTS Intern pager: [phone], text pages welcome Secure chat group [facility] Teaching Service    Upper Level Addendum:  I have seen and evaluated this patient along with Dr. Diona and reviewed the above note, making necessary revisions as appropriate.  I agree with the medical decision making and physical exam as noted above.  Wendel Lesch, MD PGY-3 [facility]

## 2023-04-27 ENCOUNTER — Encounter (HOSPITAL_COMMUNITY): Payer: Self-pay | Admitting: Student

## 2023-04-27 ENCOUNTER — Observation Stay (HOSPITAL_COMMUNITY): Payer: 59

## 2023-04-27 ENCOUNTER — Other Ambulatory Visit: Payer: Self-pay

## 2023-04-27 DIAGNOSIS — Z7984 Long term (current) use of oral hypoglycemic drugs: Secondary | ICD-10-CM | POA: Diagnosis not present

## 2023-04-27 DIAGNOSIS — E1122 Type 2 diabetes mellitus with diabetic chronic kidney disease: Secondary | ICD-10-CM | POA: Diagnosis present

## 2023-04-27 DIAGNOSIS — N179 Acute kidney failure, unspecified: Secondary | ICD-10-CM | POA: Diagnosis present

## 2023-04-27 DIAGNOSIS — Z79899 Other long term (current) drug therapy: Secondary | ICD-10-CM | POA: Diagnosis not present

## 2023-04-27 DIAGNOSIS — Z794 Long term (current) use of insulin: Secondary | ICD-10-CM | POA: Diagnosis not present

## 2023-04-27 DIAGNOSIS — Z5986 Financial insecurity: Secondary | ICD-10-CM | POA: Diagnosis not present

## 2023-04-27 DIAGNOSIS — J101 Influenza due to other identified influenza virus with other respiratory manifestations: Secondary | ICD-10-CM | POA: Diagnosis not present

## 2023-04-27 DIAGNOSIS — J180 Bronchopneumonia, unspecified organism: Secondary | ICD-10-CM | POA: Diagnosis present

## 2023-04-27 DIAGNOSIS — R0609 Other forms of dyspnea: Secondary | ICD-10-CM | POA: Diagnosis not present

## 2023-04-27 DIAGNOSIS — R0600 Dyspnea, unspecified: Secondary | ICD-10-CM | POA: Diagnosis present

## 2023-04-27 DIAGNOSIS — M545 Low back pain, unspecified: Secondary | ICD-10-CM | POA: Diagnosis present

## 2023-04-27 DIAGNOSIS — I083 Combined rheumatic disorders of mitral, aortic and tricuspid valves: Secondary | ICD-10-CM | POA: Diagnosis present

## 2023-04-27 DIAGNOSIS — I130 Hypertensive heart and chronic kidney disease with heart failure and stage 1 through stage 4 chronic kidney disease, or unspecified chronic kidney disease: Secondary | ICD-10-CM | POA: Diagnosis present

## 2023-04-27 DIAGNOSIS — Z8616 Personal history of COVID-19: Secondary | ICD-10-CM | POA: Diagnosis not present

## 2023-04-27 DIAGNOSIS — I5A Non-ischemic myocardial injury (non-traumatic): Secondary | ICD-10-CM | POA: Diagnosis present

## 2023-04-27 DIAGNOSIS — J9601 Acute respiratory failure with hypoxia: Secondary | ICD-10-CM | POA: Diagnosis present

## 2023-04-27 DIAGNOSIS — Z1152 Encounter for screening for COVID-19: Secondary | ICD-10-CM | POA: Diagnosis not present

## 2023-04-27 DIAGNOSIS — Z66 Do not resuscitate: Secondary | ICD-10-CM | POA: Diagnosis present

## 2023-04-27 DIAGNOSIS — J1008 Influenza due to other identified influenza virus with other specified pneumonia: Secondary | ICD-10-CM | POA: Diagnosis present

## 2023-04-27 DIAGNOSIS — E1165 Type 2 diabetes mellitus with hyperglycemia: Secondary | ICD-10-CM | POA: Diagnosis present

## 2023-04-27 DIAGNOSIS — I428 Other cardiomyopathies: Secondary | ICD-10-CM | POA: Diagnosis present

## 2023-04-27 DIAGNOSIS — I5023 Acute on chronic systolic (congestive) heart failure: Secondary | ICD-10-CM

## 2023-04-27 DIAGNOSIS — N281 Cyst of kidney, acquired: Secondary | ICD-10-CM | POA: Diagnosis present

## 2023-04-27 DIAGNOSIS — I959 Hypotension, unspecified: Secondary | ICD-10-CM | POA: Diagnosis present

## 2023-04-27 DIAGNOSIS — N1831 Chronic kidney disease, stage 3a: Secondary | ICD-10-CM | POA: Diagnosis present

## 2023-04-27 DIAGNOSIS — E861 Hypovolemia: Secondary | ICD-10-CM | POA: Diagnosis present

## 2023-04-27 LAB — GLUCOSE, CAPILLARY
Glucose-Capillary: 128 mg/dL — ABNORMAL HIGH (ref 70–99)
Glucose-Capillary: 124 mg/dL — ABNORMAL HIGH (ref 70–99)

## 2023-04-27 LAB — COMPREHENSIVE METABOLIC PANEL
ALT: 228 U/L — ABNORMAL HIGH (ref 0–44)
AST: 120 U/L — ABNORMAL HIGH (ref 15–41)
Albumin: 2.8 g/dL — ABNORMAL LOW (ref 3.5–5.0)
Alkaline Phosphatase: 39 U/L (ref 38–126)
Anion gap: 12 (ref 5–15)
BUN: 29 mg/dL — ABNORMAL HIGH (ref 6–20)
CO2: 21 mmol/L — ABNORMAL LOW (ref 22–32)
Calcium: 7.9 mg/dL — ABNORMAL LOW (ref 8.9–10.3)
Chloride: 102 mmol/L (ref 98–111)
Creatinine, Ser: 2.04 mg/dL — ABNORMAL HIGH (ref 0.61–1.24)
GFR, Estimated: 38 mL/min — ABNORMAL LOW (ref >60)
Glucose, Bld: 122 mg/dL — ABNORMAL HIGH (ref 70–99)
Potassium: 4 mmol/L (ref 3.5–5.1)
Sodium: 135 mmol/L (ref 135–145)
Total Bilirubin: 0.6 mg/dL (ref 0.0–1.2)
Total Protein: 6.2 g/dL — ABNORMAL LOW (ref 6.5–8.1)

## 2023-04-27 LAB — URINALYSIS, COMPLETE (UACMP) WITH MICROSCOPIC
Bacteria, UA: NONE SEEN
Bilirubin Urine: NEGATIVE
Glucose, UA: >=500 mg/dL — AB
Ketones, ur: NEGATIVE mg/dL
Leukocytes,Ua: NEGATIVE
Nitrite: NEGATIVE
Protein, ur: 100 mg/dL — AB
Specific Gravity, Urine: 1.017 (ref 1.005–1.030)
pH: 5 (ref 5.0–8.0)

## 2023-04-27 LAB — ECHOCARDIOGRAM COMPLETE
AR max vel: 2.76 cm2
AV Area VTI: 2.71 cm2
AV Area mean vel: 2.7 cm2
AV Mean grad: 3 mm[Hg]
AV Peak grad: 5.4 mm[Hg]
Ao pk vel: 1.16 m/s
Area-P 1/2: 8.92 cm2
Calc EF: 33.1 %
Height: 74 in
S' Lateral: 5.4 cm
Single Plane A2C EF: 26.6 %
Single Plane A4C EF: 36.7 %
Weight: 3830.4 [oz_av]

## 2023-04-27 LAB — HEPATITIS PANEL, ACUTE
HCV Ab: NONREACTIVE
Hep A IgM: NONREACTIVE
Hep B C IgM: NONREACTIVE
Hepatitis B Surface Ag: NONREACTIVE

## 2023-04-27 LAB — TROPONIN I (HIGH SENSITIVITY)
Troponin I (High Sensitivity): 109 ng/L (ref <18)
Troponin I (High Sensitivity): 107 ng/L (ref <18)
Troponin I (High Sensitivity): 101 ng/L (ref <18)

## 2023-04-27 LAB — COOXEMETRY PANEL
Carboxyhemoglobin: 2 % — ABNORMAL HIGH (ref 0.5–1.5)
Methemoglobin: <0.7 % (ref 0.0–1.5)
O2 Saturation: 67.8 %
Total hemoglobin: 15.5 g/dL (ref 12.0–16.0)

## 2023-04-27 LAB — HEMOGLOBIN A1C
Hgb A1c MFr Bld: 6.5 % — ABNORMAL HIGH (ref 4.8–5.6)
Mean Plasma Glucose: 139.85 mg/dL

## 2023-04-27 LAB — PROTIME-INR
INR: 1.1 (ref 0.8–1.2)
Prothrombin Time: 14.3 s (ref 11.4–15.2)

## 2023-04-27 LAB — LACTIC ACID, PLASMA: Lactic Acid, Venous: 1.4 mmol/L (ref 0.5–1.9)

## 2023-04-27 LAB — MAGNESIUM: Magnesium: 2.4 mg/dL (ref 1.7–2.4)

## 2023-04-27 LAB — APTT: aPTT: 33 s (ref 24–36)

## 2023-04-27 MED ORDER — CHLORHEXIDINE GLUCONATE CLOTH 2 % EX PADS
6.0000 | MEDICATED_PAD | Freq: Every day | CUTANEOUS | Status: DC
  Administered 2023-04-28 – 2023-04-29 (×2): 6 via TOPICAL

## 2023-04-27 MED ORDER — OSELTAMIVIR PHOSPHATE 30 MG PO CAPS
30.0000 mg | ORAL_CAPSULE | Freq: Two times a day (BID) | ORAL | Status: DC
  Administered 2023-04-27 – 2023-04-29 (×5): 30 mg via ORAL
  Filled 2023-04-27 (×7): qty 1

## 2023-04-27 MED ORDER — SODIUM CHLORIDE 0.9% FLUSH: 10.0000 mL | INTRAVENOUS | Status: DC | PRN

## 2023-04-27 MED ORDER — SODIUM CHLORIDE 0.9 % IV SOLN
INTRAVENOUS | Status: DC
Start: 2023-04-27 — End: 2023-04-28

## 2023-04-27 MED ORDER — OSELTAMIVIR PHOSPHATE 75 MG PO CAPS
75.0000 mg | ORAL_CAPSULE | Freq: Once | ORAL | Status: AC
  Administered 2023-04-27: 75 mg via ORAL
  Filled 2023-04-27: qty 1

## 2023-04-27 MED ORDER — SODIUM CHLORIDE 0.9% FLUSH
10.0000 mL | Freq: Two times a day (BID) | INTRAVENOUS | Status: DC
  Administered 2023-04-27 – 2023-04-28 (×2): 10 mL
  Administered 2023-04-28: 20 mL
  Administered 2023-04-29 – 2023-04-30 (×3): 10 mL

## 2023-04-27 MED ORDER — IPRATROPIUM-ALBUTEROL 0.5-2.5 (3) MG/3ML IN SOLN
3.0000 mL | RESPIRATORY_TRACT | Status: DC | PRN
  Administered 2023-04-29: 3 mL via RESPIRATORY_TRACT
  Filled 2023-04-27: qty 3

## 2023-04-27 NOTE — Assessment & Plan Note (Signed)
 Troponins trended flat, patient has mild chest pain.  If chest pain worsens, repeat troponins and obtain EKG.

## 2023-04-27 NOTE — Progress Notes (Signed)
*  PRELIMINARY RESULTS* Echocardiogram 2D Echocardiogram has been performed.  Tony Meyer 04/27/2023, 11:18 AM

## 2023-04-27 NOTE — Progress Notes (Addendum)
 Informed Consent   Shared Decision Making/Informed Consent The risks [stroke (1 in 1000), death (1 in 1000), kidney failure [usually temporary] (1 in 500), bleeding (1 in 200), allergic reaction [possibly serious] (1 in 200)], benefits (diagnostic support and management of coronary artery disease) and alternatives of a cardiac catheterization were discussed in detail with Tony Meyer and he is willing to proceed.     Dr. Kennyth discussed. Placing orders for [facility] only and put on board. Asking [facility] if they would want to perform and take over care. Pending decision.

## 2023-04-27 NOTE — Assessment & Plan Note (Signed)
 Suspect 2/2 vascular congestion.  Patient does have risk factors for MASLD (obesity, type 2 diabetes, hypertension).  Low albumin.  Will assess for liver disease. -RUQ U/S, PT/INR

## 2023-04-27 NOTE — Progress Notes (Signed)
 MD notified that patient forgot that he is NPO for U/S. Will update radiology team to perform U/S at a later time.

## 2023-04-27 NOTE — Assessment & Plan Note (Signed)
 Creatinine stable at 2.04, likely 2/2 third spacing and continue diuresis.  Given distended abdomen with low back pain, will obtain UA to assess for proteinuria. - P.o. fluids - Diurese as tolerated

## 2023-04-27 NOTE — Subjective & Objective (Deleted)
 I have seen and examined this patient, and reviewed their chart. I have discussed this patient with the resident. I agree with the resident's findings, assessment and care plan.  Please see my brief admit note attached to Dr Karen Kitchens 04/26/23 admit note

## 2023-04-27 NOTE — Progress Notes (Addendum)
 Patient Name: Tony Meyer Date of Encounter: 04/27/2023 [facility] Cardiologist: Alvan Carrier, MD   Interval Summary  .    No acute overnight events.  Patient continues to report feeling generally unwell.  He continues to endorse shortness of breath.  He is able to lay reasonably flat in bed and appears comfortable.  Vital Signs .    Vitals:   04/27/23 0544 04/27/23 0745 04/27/23 1138 04/27/23 1150  BP: 110/83 (!) 113/57 93/68 93/67   Pulse: 96 (!) 102 90 93  Resp: 18 16 20  (!) 26  Temp: 99.7 F (37.6 C) 99.9 F (37.7 C) 98.2 F (36.8 C) 98.7 F (37.1 C)  TempSrc: Oral Oral Oral Oral  SpO2: 97% 95% 95% 98%  Weight: 108.6 kg     Height:        Intake/Output Summary (Last 24 hours) at 04/27/2023 1317 Last data filed at 04/27/2023 0829 Gross per 24 hour  Intake 660 ml  Output 2150 ml  Net -1490 ml      04/27/2023    5:44 AM 04/26/2023    7:09 PM 04/26/2023   10:20 AM  Last 3 Weights  Weight (lbs) 239 lb 6.4 oz 238 lb 1.6 oz 250 lb  Weight (kg) 108.591 kg 108 kg 113.399 kg      Telemetry/ECG    Sinus rhythm/sinus tachycardia at times.- Personally Reviewed  Physical Exam .   GEN: No acute distress.   Neck: No JVD Cardiac: Normal rate, regular rhythm. Respiratory: Clear to auscultation bilaterally. GI: Soft, nontender, non-distended  MS: No edema  Assessment & Plan .     Tony Meyer is a 53 y.o. 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   Unfortunately, patient was lost to follow-up and has not been seen since 03/2021.  His EF had initially improved with GDMT from 23% to 40%.  Is unclear how adherent to GDMT he has been over the past 2 years.  He has definitively stopped Entresto  due to cost.  He presents today with likely acute decompensated heart failure and an influenza A infection.  He does not appear to have  significant volume overload (no peripheral edema), although does endorse abdominal distention that he attributes to fluid retention.  Additionally, he is tachycardic with soft BP and narrow pulse pressure, which is concerning for a low output state.  His LFTs and creatinine are elevated, but lactate has been normal.  Unclear how much of his symptoms are related to heart failure status versus acute influenza infection.   #.  Acute on chronic systolic heart failure: #.  Nonischemic cardiomyopathy: -Lactate was normal. -Continue diuresis with IV Lasix  to achieve euvolemia.  Patient feels that he still has abdominal swelling related to fluid retention.  Monitor urine output and uptitrate accordingly. -Will plan for right heart catheterization tomorrow at the latest given uncertainty over volume status and concern for borderline low output state. -Repeat echocardiogram is pending. -Initiation of GDMT has been limited by hypotension and slight bump in serum creatinine.  Would avoid beta-blocker at this time.  We may be forced to hold off optimization until right heart catheterization is performed and hemodynamics are known. -For now, will repeat lactate and place RUE PICC line. We will then measure a central venous O2 saturation. If low he will move to ICU for optimization. Discussed with HF  team.   #.  Elevated troponin: Low suspicion for ACS.  Likely related to acute decompensated heart failure. -High-sensitivity troponins were flat.  #. Influenza A infection: Unclear to what extent his symptoms are related to being flu positive versus heart failure. -Supportive care by primary team.  For questions or updates, please contact [facility] Please consult [email or web address] for contact info under        Signed, Fonda Kitty, MD

## 2023-04-27 NOTE — Progress Notes (Signed)
     Daily Progress Note Intern Pager: [phone]  Patient name: Tony Meyer Medical record number: [id number] Date of birth: 07-08-1970 Age: 53 y.o. Gender: male  Primary Care Provider: Patient, No Pcp Per Consultants: Cardiology Code Status: DNR/DNI  Pt Overview and Major Events to Date:  01/11: Admitted  Assessment and Plan:  53 year old male with past medical history of HFrEF, hypertension, type 2 diabetes presenting with worsening shortness of breath.  Suspect SOB 2/2 acute congestive heart failure in setting of influenza infection. Assessment & Plan Dyspnea  HmrEF Maintaining adequate SpO2, normal work of breathing on room air.  Patient reports minimal improvement with diuresis and treatments.  Influenza positive, plan to start treatment.  Wheezing on exam, diffuse crackles bilaterally.  Plan to add back GDMT as blood pressure tolerates. - Cardiology consulted, appreciate recommendations - Start Tamiflu , renal dosing - Restart DuoNebs every 4 hours as needed - Continue 60 mg IV Lasix  twice daily - Continue Farxiga  - Will hold on Coreg , spironolactone  and Entresto  in setting of soft blood pressures and AKI - Follow-up echocardiogram - Strict I's and O's - Daily weights -A.m. BMP, mag with goal K>4, Mg>2 Elevated troponin Troponins trended flat, patient has mild chest pain.  If chest pain worsens, repeat troponins and obtain EKG. AKI on CKD3a Creatinine stable at 2.04, likely 2/2 third spacing and continue diuresis.  Given distended abdomen with low back pain, will obtain UA to assess for proteinuria. - P.o. fluids - Diurese as tolerated   Elevated LFTs Suspect 2/2 vascular congestion.  Patient does have risk factors for MASLD (obesity, type 2 diabetes, hypertension).  Low albumin.  Will assess for liver disease. -RUQ U/S, PT/INR Type 2 diabetes mellitus with hyperglycemia (HCC) A1c in 2022 of 6.7. - A1c - CBG check with meals  FEN/GI: Heart healthy PPx:  Lovenox  Dispo:Pending PT recommendations  pending clinical improvement .  Subjective:  Patient reports minimal improvement in symptom of shortness of breath.  Reports some low back pain with Lasix .  Mild nonspecific chest pain.  Objective: Temp:  [98.2 F (36.8 C)-99.9 F (37.7 C)] 99.9 F (37.7 C) (01/12 0745) Pulse Rate:  [60-104] 102 (01/12 0745) Resp:  [16-32] 16 (01/12 0745) BP: (92-122)/(42-98) 113/57 (01/12 0745) SpO2:  [89 %-99 %] 95 % (01/12 0745) Weight:  [108 kg-113.4 kg] 108.6 kg (01/12 0544) Physical Exam: General: NAD, sitting up in bed Cardiovascular: RRR, no murmurs Respiratory: Wheezing bilaterally, crackles bilaterally.  Normal work of breathing on room air. Abdomen: Soft, not tender.  Protuberant abdomen. Extremities: Moving all 4 extremities, no peripheral edema  Laboratory: Most recent CBC Lab Results  Component Value Date   WBC 5.4 04/26/2023   HGB 14.9 04/26/2023   HCT 44.3 04/26/2023   MCV 88.2 04/26/2023   PLT 158 04/26/2023   Most recent BMP    Latest Ref Rng & Units 04/27/2023    2:27 AM  BMP  Glucose 70 - 99 mg/dL 877   BUN 6 - 20 mg/dL 29   Creatinine 9.38 - 1.24 mg/dL 7.95   Sodium 864 - 854 mmol/L 135   Potassium 3.5 - 5.1 mmol/L 4.0   Chloride 98 - 111 mmol/L 102   CO2 22 - 32 mmol/L 21   Calcium  8.9 - 10.3 mg/dL 7.9     Howell Lunger, DO 04/27/2023, 10:08 AM  PGY-2, [facility] FPTS Intern pager: [phone], text pages welcome Secure chat group [facility] Teaching Service

## 2023-04-27 NOTE — Plan of Care (Signed)

## 2023-04-27 NOTE — Progress Notes (Signed)
 Patient with U/S abdomen order, per u/s tech, to make pt NPO for 6 hrs. He ate around 730AM. Will reschedule for test around 1400PM. Patient aware

## 2023-04-27 NOTE — Progress Notes (Signed)
 Peripherally Inserted Central Catheter Placement  The IV Nurse, Zebedee Jaeger, has discussed with the patient and/or persons authorized to consent for the patient, the purpose of this procedure and the potential benefits and risks involved with this procedure.  The benefits include less needle sticks, lab draws from the catheter, and the patient may be discharged home with the catheter. Risks include, but not limited to, infection, bleeding, blood clot (thrombus formation), and puncture of an artery; nerve damage and irregular heartbeat and possibility to perform a PICC exchange if needed/ordered by physician.  Alternatives to this procedure were also discussed.  Bard Power PICC patient education guide, fact sheet on infection prevention and patient information card has been provided to patient /or left at bedside.    PICC Placement Documentation  PICC Double Lumen 04/27/23 Right Brachial 44 cm 0 cm (Active)  Indication for Insertion or Continuance of Line Chronic illness with exacerbations (CF, Sickle Cell, etc.) 04/27/23 1600  Exposed Catheter (cm) 0 cm 04/27/23 1600  Site Assessment Clean, Dry, Intact 04/27/23 1600  Lumen #1 Status Saline locked;Flushed;Blood return noted 04/27/23 1600  Lumen #2 Status Saline locked;Flushed;Blood return noted 04/27/23 1600  Dressing Type Transparent;Securing device 04/27/23 1600  Dressing Status Antimicrobial disc/dressing in place;Clean, Dry, Intact 04/27/23 1600  Line Care Connections checked and tightened 04/27/23 1600  Line Adjustment (NICU/IV Team Only) No 04/27/23 1600  Dressing Intervention New dressing 04/27/23 1600  Dressing Change Due 05/04/23 04/27/23 1600       Matthias Merle Chenice 04/27/2023, 4:07 PM

## 2023-04-27 NOTE — Assessment & Plan Note (Signed)
 A1c in 2022 of 6.7. - A1c - CBG check with meals

## 2023-04-27 NOTE — Assessment & Plan Note (Signed)
 Maintaining adequate SpO2, normal work of breathing on room air.  Patient reports minimal improvement with diuresis and treatments.  Influenza positive, plan to start treatment.  Wheezing on exam, diffuse crackles bilaterally.  Plan to add back GDMT as blood pressure tolerates. - Cardiology consulted, appreciate recommendations - Start Tamiflu , renal dosing - Restart DuoNebs every 4 hours as needed - Continue 60 mg IV Lasix  twice daily - Continue Farxiga  - Will hold on Coreg , spironolactone  and Entresto  in setting of soft blood pressures and AKI - Follow-up echocardiogram - Strict I's and O's - Daily weights -A.m. BMP, mag with goal K>4, Mg>2

## 2023-04-27 NOTE — Progress Notes (Signed)
 CRITICAL VALUE STICKER  CRITICAL VALUE:Troponin 109  RECEIVER (on-site recipient of call): Delwin Raspberry RN  DATE & TIME NOTIFIED: 04/26/2023 2359  MESSENGER (representative from lab):Jlasigan  MD NOTIFIED: Dr. Tharon  TIME OF NOTIFICATION:04/27/2023 0005  RESPONSE: No new orders given at this time

## 2023-04-28 ENCOUNTER — Encounter (HOSPITAL_COMMUNITY)
Admission: EM | Disposition: A | Discharge status: Home / Self Care | Payer: Self-pay | Source: Home / Self Care | Attending: Family Medicine

## 2023-04-28 ENCOUNTER — Telehealth (HOSPITAL_COMMUNITY): Payer: Self-pay | Admitting: Pharmacy Technician

## 2023-04-28 ENCOUNTER — Inpatient Hospital Stay (HOSPITAL_COMMUNITY): Payer: 59

## 2023-04-28 ENCOUNTER — Other Ambulatory Visit (HOSPITAL_COMMUNITY): Payer: Self-pay

## 2023-04-28 DIAGNOSIS — I5023 Acute on chronic systolic (congestive) heart failure: Secondary | ICD-10-CM | POA: Diagnosis not present

## 2023-04-28 DIAGNOSIS — J180 Bronchopneumonia, unspecified organism: Secondary | ICD-10-CM | POA: Diagnosis not present

## 2023-04-28 DIAGNOSIS — I428 Other cardiomyopathies: Secondary | ICD-10-CM

## 2023-04-28 DIAGNOSIS — J101 Influenza due to other identified influenza virus with other respiratory manifestations: Secondary | ICD-10-CM | POA: Diagnosis not present

## 2023-04-28 HISTORY — PX: RIGHT HEART CATH: CATH118263

## 2023-04-28 LAB — POCT I-STAT EG7
Acid-Base Excess: 0 mmol/L (ref 0.0–2.0)
Acid-Base Excess: 1 mmol/L (ref 0.0–2.0)
Bicarbonate: 26.2 mmol/L (ref 20.0–28.0)
Bicarbonate: 26.8 mmol/L (ref 20.0–28.0)
Calcium, Ion: 1.08 mmol/L — ABNORMAL LOW (ref 1.15–1.40)
Calcium, Ion: 1.09 mmol/L — ABNORMAL LOW (ref 1.15–1.40)
HCT: 48 % (ref 39.0–52.0)
HCT: 48 % (ref 39.0–52.0)
Hemoglobin: 16.3 g/dL (ref 13.0–17.0)
Hemoglobin: 16.3 g/dL (ref 13.0–17.0)
O2 Saturation: 67 %
O2 Saturation: 69 %
Potassium: 4.1 mmol/L (ref 3.5–5.1)
Potassium: 4.1 mmol/L (ref 3.5–5.1)
Sodium: 138 mmol/L (ref 135–145)
Sodium: 139 mmol/L (ref 135–145)
TCO2: 28 mmol/L (ref 22–32)
TCO2: 28 mmol/L (ref 22–32)
pCO2, Ven: 45.1 mm[Hg] (ref 44–60)
pCO2, Ven: 45.4 mm[Hg] (ref 44–60)
pH, Ven: 7.369 (ref 7.25–7.43)
pH, Ven: 7.382 (ref 7.25–7.43)
pO2, Ven: 36 mm[Hg] (ref 32–45)
pO2, Ven: 37 mm[Hg] (ref 32–45)

## 2023-04-28 LAB — HEPATIC FUNCTION PANEL
ALT: 162 U/L — ABNORMAL HIGH (ref 0–44)
AST: 78 U/L — ABNORMAL HIGH (ref 15–41)
Albumin: 2.8 g/dL — ABNORMAL LOW (ref 3.5–5.0)
Alkaline Phosphatase: 37 U/L — ABNORMAL LOW (ref 38–126)
Bilirubin, Direct: 0.2 mg/dL (ref 0.0–0.2)
Indirect Bilirubin: 0.6 mg/dL (ref 0.3–0.9)
Total Bilirubin: 0.8 mg/dL (ref 0.0–1.2)
Total Protein: 6.4 g/dL — ABNORMAL LOW (ref 6.5–8.1)

## 2023-04-28 LAB — BASIC METABOLIC PANEL
Anion gap: 13 (ref 5–15)
BUN: 41 mg/dL — ABNORMAL HIGH (ref 6–20)
CO2: 21 mmol/L — ABNORMAL LOW (ref 22–32)
Calcium: 8.1 mg/dL — ABNORMAL LOW (ref 8.9–10.3)
Chloride: 101 mmol/L (ref 98–111)
Creatinine, Ser: 2.16 mg/dL — ABNORMAL HIGH (ref 0.61–1.24)
GFR, Estimated: 35 mL/min — ABNORMAL LOW (ref >60)
Glucose, Bld: 106 mg/dL — ABNORMAL HIGH (ref 70–99)
Potassium: 4.1 mmol/L (ref 3.5–5.1)
Sodium: 135 mmol/L (ref 135–145)

## 2023-04-28 LAB — GLUCOSE, CAPILLARY
Glucose-Capillary: 110 mg/dL — ABNORMAL HIGH (ref 70–99)
Glucose-Capillary: 114 mg/dL — ABNORMAL HIGH (ref 70–99)
Glucose-Capillary: 194 mg/dL — ABNORMAL HIGH (ref 70–99)

## 2023-04-28 LAB — MAGNESIUM: Magnesium: 2.5 mg/dL — ABNORMAL HIGH (ref 1.7–2.4)

## 2023-04-28 SURGERY — RIGHT HEART CATH

## 2023-04-28 MED ORDER — HYDRALAZINE HCL 10 MG PO TABS
10.0000 mg | ORAL_TABLET | Freq: Three times a day (TID) | ORAL | Status: DC
  Administered 2023-04-28 – 2023-04-29 (×2): 10 mg via ORAL
  Filled 2023-04-28 (×2): qty 1

## 2023-04-28 MED ORDER — FENTANYL CITRATE (PF) 100 MCG/2ML IJ SOLN
INTRAMUSCULAR | Status: AC
  Filled 2023-04-28: qty 2

## 2023-04-28 MED ORDER — LIDOCAINE HCL (PF) 1 % IJ SOLN
INTRAMUSCULAR | Status: AC
Start: 2023-04-28 — End: ?
  Filled 2023-04-28: qty 30

## 2023-04-28 MED ORDER — FENTANYL CITRATE (PF) 100 MCG/2ML IJ SOLN
INTRAMUSCULAR | Status: DC | PRN
  Administered 2023-04-28: 25 ug via INTRAVENOUS

## 2023-04-28 MED ORDER — SODIUM CHLORIDE 0.9 % IV SOLN: INTRAVENOUS | Status: DC

## 2023-04-28 MED ORDER — LIDOCAINE HCL (PF) 1 % IJ SOLN
INTRAMUSCULAR | Status: DC | PRN
  Administered 2023-04-28: 10 mL

## 2023-04-28 MED ORDER — DAPAGLIFLOZIN PROPANEDIOL 10 MG PO TABS
10.0000 mg | ORAL_TABLET | Freq: Every day | ORAL | Status: DC
  Administered 2023-04-29 – 2023-04-30 (×2): 10 mg via ORAL
  Filled 2023-04-28 (×2): qty 1

## 2023-04-28 MED ORDER — ISOSORB DINITRATE-HYDRALAZINE 20-37.5 MG PO TABS: 0.5000 | ORAL_TABLET | Freq: Three times a day (TID) | ORAL | Status: DC

## 2023-04-28 SURGICAL SUPPLY — 6 items
CATH SWAN GANZ 7F STRAIGHT (CATHETERS) IMPLANT
KIT MICROPUNCTURE NIT STIFF (SHEATH) IMPLANT
PACK CARDIAC CATHETERIZATION (CUSTOM PROCEDURE TRAY) ×2 IMPLANT
SHEATH PINNACLE 7F 10CM (SHEATH) IMPLANT
TRANSDUCER W/STOPCOCK (MISCELLANEOUS) IMPLANT
TUBING ART PRESS 72 MALE/FEM (TUBING) IMPLANT

## 2023-04-28 NOTE — Plan of Care (Signed)
 Went to see patient s/p cath, he is doing well and feeling pretty good. Endorses no concerns at this time. Was weaned to 1.5L O2 and is satting in the lower 90s. Still endorses the same difficulty with breathing and continues to have crackles on exam. Will order incentive spirometry for him.  Kathrine Melena, DO [facility] Family [facility], PGY-1 04/28/23 3:40 PM  Service pager [phone]

## 2023-04-28 NOTE — Plan of Care (Addendum)
 Patient remains on MC-3E at time of writing. PICC to RUE. CVP monitoring q 4 hours. Adult oral care protocol initiated by this RN, per protocol. No acute changes overnight as of time of writing.   Problem: Education: Goal: Knowledge of General Education information will improve Description: Including pain rating scale, medication(s)/side effects and non-pharmacologic comfort measures Outcome: Progressing   Problem: Health Behavior/Discharge Planning: Goal: Ability to manage health-related needs will improve Outcome: Progressing   Problem: Clinical Measurements: Goal: Ability to maintain clinical measurements within normal limits will improve Outcome: Progressing Goal: Will remain free from infection Outcome: Progressing Goal: Diagnostic test results will improve Outcome: Progressing Goal: Respiratory complications will improve Outcome: Progressing Goal: Cardiovascular complication will be avoided Outcome: Progressing   Problem: Activity: Goal: Risk for activity intolerance will decrease Outcome: Progressing   Problem: Nutrition: Goal: Adequate nutrition will be maintained Outcome: Progressing   Problem: Coping: Goal: Level of anxiety will decrease Outcome: Progressing   Problem: Elimination: Goal: Will not experience complications related to bowel motility Outcome: Progressing Goal: Will not experience complications related to urinary retention Outcome: Progressing   Problem: Pain Management: Goal: General experience of comfort will improve Outcome: Progressing   Problem: Safety: Goal: Ability to remain free from injury will improve Outcome: Progressing   Problem: Skin Integrity: Goal: Risk for impaired skin integrity will decrease Outcome: Progressing   Problem: Education: Goal: Ability to demonstrate management of disease process will improve Outcome: Progressing Goal: Ability to verbalize understanding of medication therapies will improve Outcome:  Progressing Goal: Individualized Educational Video(s) Outcome: Progressing   Problem: Activity: Goal: Capacity to carry out activities will improve Outcome: Progressing   Problem: Cardiac: Goal: Ability to achieve and maintain adequate cardiopulmonary perfusion will improve Outcome: Progressing   Problem: Education: Goal: Understanding of CV disease, CV risk reduction, and recovery process will improve Outcome: Progressing Goal: Individualized Educational Video(s) Outcome: Progressing   Problem: Activity: Goal: Ability to return to baseline activity level will improve Outcome: Progressing   Problem: Cardiovascular: Goal: Ability to achieve and maintain adequate cardiovascular perfusion will improve Outcome: Progressing Goal: Vascular access site(s) Level 0-1 will be maintained Outcome: Progressing   Problem: Health Behavior/Discharge Planning: Goal: Ability to safely manage health-related needs after discharge will improve Outcome: Progressing

## 2023-04-28 NOTE — Progress Notes (Deleted)
   Patient Name: Tony Meyer Date of Encounter: 04/28/2023 [facility] Cardiologist: Alvan Carrier, MD   Interval Summary  .    Patient resting comfortably in the bed. He is on 2L oxygen via [place]. He states that he has been doing about the same. He is aware of the potential for RHC today and agrees with this plan. He reports some shortness of breath, he believes to be related to his influenza infection. He denies any chest pain, palpitations, lightheadedness  Vital Signs .    Vitals:   04/28/23 0112 04/28/23 0545 04/28/23 0853 04/28/23 0856  BP: 101/75 100/80 (!) 127/117 (!) 135/114  Pulse: 85 86    Resp: 17 18 (!) 21 (!) 21  Temp: 98 F (36.7 C) 98.1 F (36.7 C)    TempSrc: Oral Oral    SpO2: 95% 97%    Weight:  107 kg    Height:        Intake/Output Summary (Last 24 hours) at 04/28/2023 0932 Last data filed at 04/28/2023 0115 Gross per 24 hour  Intake 610 ml  Output 500 ml  Net 110 ml      04/28/2023    5:45 AM 04/27/2023    5:44 AM 04/26/2023    7:09 PM  Last 3 Weights  Weight (lbs) 235 lb 14.4 oz 239 lb 6.4 oz 238 lb 1.6 oz  Weight (kg) 107.004 kg 108.591 kg 108 kg     Telemetry/ECG    Sinus rhythm with PVCs - Personally Reviewed  Physical Exam .   GEN: No acute distress.   Neck: No JVD Cardiac: RRR, no murmurs.  Respiratory: bilateral wheezing, normal respiratory effort on 2L oxygen via [place]. GI: Soft, nontender, non-distended  MS: No edema  Assessment & Plan .     Acute on chronic systolic heart failure Nonischemic cardiomyopathy  Elevated troponins  Echocardiogram from 04/27/23 showed: LVEF 30-35%, global hypokinesis, mild concentric LVH, grade II diastolic dysfunction, mildly reduced RV systolic function, moderately enlarged RV, mildly dilated left/right atria, mild to moderate MR, mild to moderate TR, normal IVC size Echo from 03/2021 showed LVEF 40-45% Troponins 97>109>107>101 Weight today 235.9 lb down from 239 lb yesterday and 250 lb on  admission  Most recent BP 100/80, HR 86 Most recent creatinine up 2.16 from 2.04 yesterday -- Continue daily weights and strict I/Os -- Continue IV Lasix  60 mg BID -- Continue Farxiga  10 mg daily  -- Initiation of GDMT to occur post RHC due to hypotension and rise in creatinine but after RHC would consider restarting BB, potentially starting spironolactone , hydralazine  depending on vitals and renal function. Likely hold off Entresto  due to renal fiunction  Per primary Influenza A infection AKI on CKD stage 3a T2DM Elevated LFTs  For questions or updates, please contact [facility] Please consult [email or web address] for contact info under     Signed, Waddell DELENA Donath, PA-C

## 2023-04-28 NOTE — Progress Notes (Signed)
 Heart Failure Navigator Progress Note  Assessed for Heart & Vascular TOC clinic readiness.  Patient does not meet criteria due to Advanced Heart Failure Team patient of Dr. Gala Romney. .   Navigator will sign off at this time.   Rhae Hammock, BSN, Scientist, clinical (histocompatibility and immunogenetics) Only

## 2023-04-28 NOTE — Assessment & Plan Note (Addendum)
 Creatinine stable at 2.04>2.16, likely 2/2 third spacing and continue diuresis.  Given distended abdomen with low back pain, will obtain UA to assess for proteinuria. - PO fluids - Diurese as tolerated

## 2023-04-28 NOTE — Progress Notes (Addendum)
 Daily Progress Note Intern Pager: [phone]  Patient name: Tony Meyer Medical record number: [id number] Date of birth: [birth year] Age: 53 y.o. Gender: male  Primary Care Provider: Patient, No Pcp Per Consultants: Cardiology Code Status: DNR/DNI  Pt Overview and Major Events to Date:  01/11: Admitted  Assessment and Plan: 53 year old male with PMHx of HFrEF, HTN, and T2DM presenting with worsening shortness of breath.  Suspect SOB 2/2 acute CHF in setting of influenza infection. Assessment & Plan Acute on chronic systolic heart failure (HCC) Maintaining adequate SpO2, normal work of breathing on 3L.  Patient reports minimal improvement with diuresis and treatments.  Wheezing has improved, diffuse crackles bilaterally.  Plan to add back GDMT as blood pressure tolerates.  Echo demonstrated LVEF 30-35%, LV global hypokinesis, mildly-moderately dilated with mild LVH; G2DD; LA and RA mildly dilated; mild-mod MVR and TVR. - Cardiology consulted, appreciate recommendations -  R heart catheterization today demonstrating moderate-severely reduced cardiac index - Restart DuoNebs every 4 hours PRN - Continue 60 mg IV Lasix  BID - Continue Farxiga  - Will hold on Coreg , Spironolactone  and Entresto  in setting of soft blood pressures and AKI - Strict I/O - Daily weights - AM BMP, Mag with goal K>4, Mg>2 Elevated troponin Troponins trended flat, patient has mild chest pain.  If chest pain worsens, repeat troponins and obtain EKG. AKI on CKD3a Creatinine stable at 2.04>2.16, likely 2/2 third spacing and continue diuresis.  Given distended abdomen with low back pain, will obtain UA to assess for proteinuria. - PO fluids - Diurese as tolerated  Elevated LFTs Suspect 2/2 vascular congestion, down-trending. Patient does have risk factors for MASLD (obesity, type 2 diabetes, hypertension).  Low albumin.  Will assess for liver disease.RUQ US  demonstrated incidental finding of R kidney cysts.   Type 2 diabetes mellitus with hyperglycemia (HCC) A1c in 2022 of 6.7>6.5.  CBG 100s to 120s. - CBG check with meals Influenza A with respiratory manifestations Influenza positive. On Tamiflu  30 mg BID - Tamiflu , renal dosing  FEN/GI: Heart healthy PPx: Lovenox  Dispo: Pending PT recommendations and clinical improvement .   Subjective:  Remains afebrile overnight.  Patient is doing well this morning sitting up in bed.  Reports that his work of breathing is still labored but stable on 2L O2.  Objective: Temp:  [98 F (36.7 C)-98.5 F (36.9 C)] 98 F (36.7 C) (01/13 1107) Pulse Rate:  [0-95] 82 (01/13 1126) Resp:  [17-25] 18 (01/13 1126) BP: (88-135)/(66-117) 98/67 (01/13 1107) SpO2:  [92 %-99 %] 99 % (01/13 1126) Weight:  [107 kg] 107 kg (01/13 0545) Physical Exam: General: Sitting up in bed in NAD Cardiovascular: RRR. No M/R/G. Respiratory: Bibasilar crackles and no wheezing.  Normal WOB on 3L.  Abdomen: Soft, non-tender, distention. Normoactive bowel sounds. Extremities: Moving all 4 extremities, no BLE edema.  Laboratory: Most recent CBC Lab Results  Component Value Date   WBC 5.4 04/26/2023   HGB 14.9 04/26/2023   HCT 44.3 04/26/2023   MCV 88.2 04/26/2023   PLT 158 04/26/2023   Most recent BMP    Latest Ref Rng & Units 04/28/2023    6:10 AM  BMP  Glucose 70 - 99 mg/dL 893   BUN 6 - 20 mg/dL 41   Creatinine 9.38 - 1.24 mg/dL 7.83   Sodium 864 - 854 mmol/L 135   Potassium 3.5 - 5.1 mmol/L 4.1   Chloride 98 - 111 mmol/L 101   CO2 22 - 32 mmol/L 21   Calcium   8.9 - 10.3 mg/dL 8.1    Mag: 2.5  Imaging/Diagnostic Tests: US  RUQ: 1. Incidental finding of simple cysts within the right kidney. 2. Otherwise unremarkable right upper quadrant ultrasound.  Janna Ferrier, DO 04/28/2023, 12:44 PM PGY-1, [facility]  FPTS Intern pager: [phone], text pages welcome Secure chat group [facility] Teaching Service

## 2023-04-28 NOTE — Assessment & Plan Note (Addendum)
 Troponins trended flat, patient has mild chest pain.  If chest pain worsens, repeat troponins and obtain EKG.

## 2023-04-28 NOTE — Assessment & Plan Note (Addendum)
 Maintaining adequate SpO2, normal work of breathing on 3L.  Patient reports minimal improvement with diuresis and treatments.  Wheezing has improved, diffuse crackles bilaterally.  Plan to add back GDMT as blood pressure tolerates.  Echo demonstrated LVEF 30-35%, LV global hypokinesis, mildly-moderately dilated with mild LVH; G2DD; LA and RA mildly dilated; mild-mod MVR and TVR. - Cardiology consulted, appreciate recommendations -  R heart catheterization today demonstrating moderate-severely reduced cardiac index - Restart DuoNebs every 4 hours PRN - Continue 60 mg IV Lasix  BID - Continue Farxiga  - Will hold on Coreg , Spironolactone  and Entresto  in setting of soft blood pressures and AKI - Strict I/O - Daily weights - AM BMP, Mag with goal K>4, Mg>2

## 2023-04-28 NOTE — Assessment & Plan Note (Addendum)
 Suspect 2/2 vascular congestion, down-trending. Patient does have risk factors for MASLD (obesity, type 2 diabetes, hypertension).  Low albumin.  Will assess for liver disease.RUQ US demonstrated incidental finding of R kidney cysts.

## 2023-04-28 NOTE — Progress Notes (Signed)
 OT Cancellation Note  Patient Details Name: SUFYAN MEIDINGER MRN: [id number] DOB: [birth year]   Cancelled Treatment:    Reason Eval/Treat Not Completed: (P) Medical issues which prohibited therapy, bedrest, will check back tomorrow  Elouise JONELLE Bott 04/28/2023, 11:18 AM

## 2023-04-28 NOTE — TOC Initial Note (Signed)
 Transition of Care [facility]) - Initial/Assessment Note    Patient Details  Name: Tony Meyer MRN: [id number] Date of Birth: 05/27/1970  [facility] CM/SW Contact:    Arlana JINNY Nicholaus ISRAEL Phone Number: [phone] 04/28/2023, 1:45 PM  Clinical Narrative:  [facility] CSW spoke with pt over the phone. Pt stated that he lives with his two cousins who are disabled.  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 Pt stated that he has no history of HH services. Pt stated that he does not use any equipment. Pt stated that he has a scale at home. Pt stated that he does not have a PCP. CSW explained that a hospital follow up appointment will be scheduled closer to dc. Pt agrees.  Pt stated that he needs a work note although he is unsure if he will return due to medical condition. Pt inquired about disability. CSW stated that she will inform the [facility] Team and start the process.   TOC will continue following.                Expected Discharge Plan: Home/Self Care Barriers to Discharge: Continued Medical Work up   Patient Goals and CMS Choice            Expected Discharge Plan and Services       Living arrangements for the past 2 months: Single Family Home                                      Prior Living Arrangements/Services Living arrangements for the past 2 months: Single Family Home Lives with:: Relatives Patient language and need for interpreter reviewed:: Yes Do you feel safe going back to the place where you live?: Yes      Need for Family Participation in Patient Care: Yes (Comment) Care giver support system in place?: No (comment)   Criminal Activity/Legal Involvement Pertinent to Current Situation/Hospitalization: No - Comment as needed  Activities of Daily Living   ADL Screening (condition at time of admission) Independently performs ADLs?: Yes (appropriate for developmental age) Is the patient deaf or have difficulty hearing?: No Does the  patient have difficulty seeing, even when wearing glasses/contacts?: No Does the patient have difficulty concentrating, remembering, or making decisions?: No  Permission Sought/Granted                  Emotional Assessment Appearance:: Appears stated age Attitude/Demeanor/Rapport: Engaged Affect (typically observed): Appropriate Orientation: : Oriented to Self, Oriented to Place, Oriented to  Time, Oriented to Situation Alcohol / Substance Use: Not Applicable Psych Involvement: No (comment)  Admission diagnosis:  Dyspnea [R06.00] Acute on chronic systolic congestive heart failure (HCC) [I50.23] Acute on chronic systolic heart failure (HCC) [I50.23] Patient Active Problem List   Diagnosis Date Noted   Type 2 diabetes mellitus with hyperglycemia (HCC) 04/27/2023   Influenza A with respiratory manifestations 04/27/2023   Bronchopneumonia 04/27/2023   Acute on chronic systolic heart failure (HCC) 04/26/2023   Nonischemic cardiomyopathy (HCC) 04/26/2023   Elevated hemoglobin A1c measurement 04/26/2023   Elevated LFTs 04/26/2023   Chronic systolic heart failure (HCC) 11/09/2020   Chronic kidney disease    Elevated troponin 07/14/2020   Hypoxia 03/15/2020   Essential hypertension 03/15/2020   AKI on CKD3a 03/15/2020   PCP:  Patient, No Pcp Per Pharmacy:   [facility] [address] [place] - [address] [place] [address] Phone: [phone] Fax: [phone]  [facility] [address] [place] [address] Phone: [phone] Fax: [phone]     Social Drivers of Health (SDOH) Social History: SDOH Screenings   Food Insecurity: No Food Insecurity (04/26/2023)  Housing: Low Risk  (04/26/2023)  Transportation Needs: No Transportation Needs (04/26/2023)  Utilities: Not At Risk (04/26/2023)  Financial Resource Strain: Medium Risk (07/19/2020)  Tobacco Use: Low Risk  (04/26/2023)   SDOH Interventions:      Readmission Risk Interventions     No data to display

## 2023-04-28 NOTE — Telephone Encounter (Signed)
 Patient Product/process Development Scientist completed.    The patient is insured through  [employer] . Patient has [employer], may use a copay card, and/or apply for patient assistance if available.    Ran test claim for Entresto  24-26 mg and the current 30 day co-pay is $50.00.  Ran test claim for Farxiga  10 mg and was filled on 04/22/2023 for a 90 day supply  This test claim was processed through [facility]- copay amounts may vary at other pharmacies due to [employer], or as the patient moves through the different stages of their insurance plan.     Tony Meyer, CPHT Pharmacy Technician III Certified Patient Advocate [facility] Patient Advocate Team Direct Number: [phone]  Fax: [phone]

## 2023-04-28 NOTE — Consult Note (Signed)
 Advanced Heart Failure Team Consult Note   Primary Physician: Patient, No Pcp Per Cardiologist:  Alvan Carrier, MD AHF MD: Dr. Cherrie   Reason for Consultation: Acute on Chronic Systolic Heart Failure  HPI:    Tony Meyer is seen today for evaluation of acute on chronic systolic heart failure at the request of Dr. Kennyth, Cardiology.   53 y/o AAM w/ h/o COVID 19 infection 03/2020, HTN, chronic renal insuffiency and chronic systolic HF.   Presented to [facility] on 07/13/20 with increased dyapnea and edema. Dyspnea started shortly after being diagnosed w/ COVID. ECHO showed severely reduced EF 20-25% and RV mildly reduced. Started on IV lasix . Transferred to [facility] on 07/18/20. R/LHC showed normal coronaries, severe NICM EF 15% w/ volume overload and preserved CO. GDMT further titrated. cMRI LVEF < 25% and moderately reduced RV -->c/w NICM. Discharged 07/20/20.    At his follow up 4/22, he did not start his metoprolol  due to cost. AHF follow up 6/22 he had been out of meds for a week and restarted.   Last seen in clinic 03/2021. Echo showed improved EF, up to 40-45%.  Unfortunately, he was lost to f/u after that visit. Has been off HF meds for an extended period of time. Says he works a lot for [employer] Has been SOB for several months, w/ symptoms progressively worsening.   Now admitted w/ acute systolic heart failure and hypoxia. Found to be in a/c CHF w/ CXR showing pulmonary edema but also Influenza A +. SCr 2.0 on admit (previous baseline was ~1.6 but this was in 2022). He was diuresed w/ IV Lasix . Echo shows drop in EF back down to 30-35%, RV mildly reduced. Given concern for low output, AHF team was asked to see today and perform RHC.   RHC demonstrated normal pre and post capillary filling pressures  and mod-severely reduced CI   HEMODYNAMICS: RA:                  4 mmHg (mean) RV:                  31/2-4 mmHg PA:                  28/14 mmHg (21 mean) PCWP:            14 mmHg  (mean)                                      Estimated Fick CO/CI   5.7 L/min, 2.4 L/min/m2 Thermodilution CO/CI   4.5 L/min, 1.9 L/min/m2                                              TPG                 7  mmHg                                              PVR                 1.2-1.6 Wood Units  [place]  3.5     He is overall feeling better today but c/w wheezing. SBPs low 110s.    Echo 04/25/22 1. Left ventricular ejection fraction, by estimation, is 30 to 35%. The  left ventricle has moderately decreased function. The left ventricle  demonstrates global hypokinesis. The left ventricular internal cavity size  was mildly to moderately dilated.  There is mild concentric left ventricular hypertrophy. Left ventricular  diastolic parameters are consistent with Grade II diastolic dysfunction  (pseudonormalization). The average left ventricular global longitudinal  strain is -4.6 %. The global  longitudinal strain is abnormal.   2. Right ventricular systolic function is mildly reduced. The right  ventricular size is moderately enlarged.   3. Left atrial size was mildly dilated.   4. Right atrial size was mildly dilated.   5. The mitral valve is normal in structure. Mild to moderate mitral valve  regurgitation. No evidence of mitral stenosis.   6. Tricuspid valve regurgitation is mild to moderate.   7. The aortic valve is normal in structure. Aortic valve regurgitation is  not visualized. Aortic valve sclerosis/calcification is present, without  any evidence of aortic stenosis.   8. The inferior vena cava is normal in size with greater than 50%  respiratory variability, suggesting right atrial pressure of 3 mmHg.   Home Medications Prior to Admission medications   Medication Sig Start Date End Date Taking? Authorizing Provider  carvedilol  (COREG ) 3.125 MG tablet TAKE 1 TABLET BY MOUTH TWICE DAILY WITH A MEAL . APPOINTMENT REQUIRED FOR FUTURE REFILLS Patient taking  differently: Take 3.125 mg by mouth 2 (two) times daily with a meal. 04/22/23  Yes Bensimhon, Toribio SAUNDERS, MD  FARXIGA  10 MG TABS tablet TAKE 1 TABLET BY MOUTH ONCE DAILY. APPOINTMENT NEEDED FOR FUTURE REFILLS 04/22/23  Yes Bensimhon, Toribio SAUNDERS, MD  furosemide  (LASIX ) 40 MG tablet TAKE 1 TABLET BY MOUTH ONCE DAILY . APPOINTMENT REQUIRED FOR FUTURE REFILLS 04/22/23  Yes Bensimhon, Toribio SAUNDERS, MD  potassium chloride  SA (KLOR-CON  M) 20 MEQ tablet TAKE 1 TABLET BY MOUTH ONCE DAILY AS NEEDED WHEN  TAKING  FUROSEMIDE . NEED APPOINTMENT FOR FURTHER REFILLS. Patient taking differently: Take 20 mEq by mouth daily. 08/27/22  Yes Rolan Ezra RAMAN, MD  spironolactone  (ALDACTONE ) 25 MG tablet TAKE 1 TABLET BY MOUTH ONCE DAILY. PATIENT NEEDS TO BE SEEN IN OFFICE FOR FURTHER REFILLS Patient taking differently: Take 25 mg by mouth daily. 04/22/23  Yes Bensimhon, Toribio SAUNDERS, MD  ENTRESTO  49-51 MG TAKE 1 TABLET BY MOUTH TWICE DAILY. APPOINTMENT REQUIRED FOR FUTURE REFILLS Patient taking differently: Take 1 tablet by mouth 2 (two) times daily. 09/23/22   Rolan Ezra RAMAN, MD    Past Medical History: Past Medical History:  Diagnosis Date   Acute systolic heart failure (HCC) 07/18/2020   AKI (acute kidney injury) (HCC) 03/15/2020   Chronic kidney disease    Chronic systolic heart failure (HCC) 11/09/2020   Elevated hemoglobin A1c measurement 04/26/2023   6.7% in 2022     Essential hypertension 03/15/2020   Nonischemic cardiomyopathy (HCC) 04/26/2023   Pneumonia due to COVID-19 virus 03/14/2020    Past Surgical History: Past Surgical History:  Procedure Laterality Date   HERNIA REPAIR     RIGHT/LEFT HEART CATH AND CORONARY ANGIOGRAPHY N/A 07/19/2020   Procedure: RIGHT/LEFT HEART CATH AND CORONARY ANGIOGRAPHY;  Surgeon: Cherrie Toribio SAUNDERS, MD;  Location: [facility];  Service: Cardiovascular;  Laterality: N/A;    Family History: Family History  Problem Relation Age of Onset  Heart failure Neg Hx     Social  History: Social History   Socioeconomic History   Marital status: Single    Spouse name: Not on file   Number of children: Not on file   Years of education: Not on file   Highest education level: Not on file  Occupational History   Not on file  Tobacco Use   Smoking status: Never   Smokeless tobacco: Never  Vaping Use   Vaping status: Never Used  Substance and Sexual Activity   Alcohol use: Never   Drug use: Never   Sexual activity: Not on file  Other Topics Concern   Not on file  Social History Narrative   Not on file   Social Drivers of Health   Financial Resource Strain: Medium Risk (07/19/2020)   Overall Financial Resource Strain (CARDIA)    Difficulty of Paying Living Expenses: Somewhat hard  Food Insecurity: No Food Insecurity (04/26/2023)   Hunger Vital Sign    Worried About Running Out of Food in the Last Year: Never true    Ran Out of Food in the Last Year: Never true  Transportation Needs: No Transportation Needs (04/26/2023)   PRAPARE - Administrator, Civil Service (Medical): No    Lack of Transportation (Non-Medical): No  Physical Activity: Not on file  Stress: Not on file  Social Connections: Not on file    Allergies:  No Known Allergies  Objective:    Vital Signs:   Temp:  [98 F (36.7 C)-98.7 F (37.1 C)] 98.1 F (36.7 C) (01/13 0545) Pulse Rate:  [85-94] 86 (01/13 0545) Resp:  [17-26] 21 (01/13 0856) BP: (93-135)/(67-117) 135/114 (01/13 0856) SpO2:  [92 %-98 %] 97 % (01/13 0545) Weight:  [107 kg] 107 kg (01/13 0545)    Weight change: Filed Weights   04/26/23 1909 04/27/23 0544 04/28/23 0545  Weight: 108 kg 108.6 kg 107 kg    Intake/Output:   Intake/Output Summary (Last 24 hours) at 04/28/2023 0928 Last data filed at 04/28/2023 0115 Gross per 24 hour  Intake 610 ml  Output 500 ml  Net 110 ml      Physical Exam    General:  Well appearing. No resp difficulty HEENT: normal Neck: supple. JVP not elevated . Carotids 2+  bilat; no bruits. No lymphadenopathy or thyromegaly appreciated. Cor: PMI nondisplaced. Regular rate & rhythm. No rubs, gallops or murmurs. Lungs: diffuse b/l expiratory wheezing  Abdomen: soft, nontender, nondistended. No hepatosplenomegaly. No bruits or masses. Good bowel sounds. Extremities: no cyanosis, clubbing, rash, edema Neuro: alert & orientedx3, cranial nerves grossly intact. moves all 4 extremities w/o difficulty. Affect pleasant   Telemetry   NSR 80s   EKG    N/a   Labs   Basic Metabolic Panel: Recent Labs  Lab 04/26/23 1023 04/27/23 0227 04/28/23 0610  NA 133* 135 135  K 4.4 4.0 4.1  CL 101 102 101  CO2 20* 21* 21*  GLUCOSE 132* 122* 106*  BUN 24* 29* 41*  CREATININE 2.01* 2.04* 2.16*  CALCIUM  8.4* 7.9* 8.1*  MG  --  2.4 2.5*    Liver Function Tests: Recent Labs  Lab 04/26/23 1640 04/27/23 0227 04/28/23 0610  AST 159* 120* 78*  ALT 288* 228* 162*  ALKPHOS 44 39 37*  BILITOT 0.7 0.6 0.8  PROT 6.4* 6.2* 6.4*  ALBUMIN 3.2* 2.8* 2.8*   No results for input(s): LIPASE, AMYLASE in the last 168 hours. No results for input(s): AMMONIA in the  last 168 hours.  CBC: Recent Labs  Lab 04/26/23 1023  WBC 5.4  HGB 14.9  HCT 44.3  MCV 88.2  PLT 158    Cardiac Enzymes: No results for input(s): CKTOTAL, CKMB, CKMBINDEX, TROPONINI in the last 168 hours.  BNP: BNP (last 3 results) Recent Labs    04/26/23 1023  BNP 584.7*    ProBNP (last 3 results) No results for input(s): PROBNP in the last 8760 hours.   CBG: Recent Labs  Lab 04/27/23 1650 04/27/23 2158 04/28/23 0547  GLUCAP 128* 124* 110*    Coagulation Studies: Recent Labs    04/27/23 1017  LABPROT 14.3  INR 1.1     Imaging   US  Abdomen Limited RUQ (LIVER/GB) Result Date: 04/28/2023 CLINICAL DATA:  Transaminitis. EXAM: ULTRASOUND ABDOMEN LIMITED RIGHT UPPER QUADRANT COMPARISON:  None Available. FINDINGS: Gallbladder: No gallstones or wall thickening  visualized (3.2 mm). No sonographic Murphy sign noted by sonographer. Common bile duct: Diameter: 4.3 mm Liver: No focal lesion identified. Within normal limits in parenchymal echogenicity. Portal vein is patent on color Doppler imaging with normal direction of blood flow towards the liver. Other: Simple cysts are seen within the upper pole of the right kidney. The largest measures approximally 3.3 cm x 3.4 cm x 2.7 cm. IMPRESSION: 1. Incidental finding of simple cysts within the right kidney. 2. Otherwise unremarkable right upper quadrant ultrasound. Electronically Signed   By: Suzen Dials M.D.   On: 04/28/2023 02:47   ECHOCARDIOGRAM COMPLETE Result Date: 04/27/2023    ECHOCARDIOGRAM REPORT   Patient Name:   Tony Meyer Date of Exam: 04/27/2023 Medical Rec #:  [id number]        Height:       74.0 in Accession #:    [id number]       Weight:       239.4 lb Date of Birth:  [birth year]         BSA:          2.346 m Patient Age:    52 years         BP:           113/57 mmHg Patient Gender: M                HR:           100 bpm. Exam Location:  Inpatient Procedure: 2D Echo, 3D Echo, Cardiac Doppler, Color Doppler and Strain Analysis Indications:    DOE  History:        Patient has prior history of Echocardiogram examinations, most                 recent 03/15/2021. Cardiomyopathy, CKD3a, Signs/Symptoms:Dyspnea                 and Shortness of Breath; Risk Factors:Non-Smoker, Diabetes and                 Hypertension.  Sonographer:    Tillman Nora RVT RCS Referring Phys: DIONA PERKINS IMPRESSIONS  1. Left ventricular ejection fraction, by estimation, is 30 to 35%. The left ventricle has moderately decreased function. The left ventricle demonstrates global hypokinesis. The left ventricular internal cavity size was mildly to moderately dilated. There is mild concentric left ventricular hypertrophy. Left ventricular diastolic parameters are consistent with Grade II diastolic dysfunction (pseudonormalization). The  average left ventricular global longitudinal strain is -4.6 %. The global longitudinal strain is abnormal.  2. Right ventricular systolic function is mildly reduced. The right  ventricular size is moderately enlarged.  3. Left atrial size was mildly dilated.  4. Right atrial size was mildly dilated.  5. The mitral valve is normal in structure. Mild to moderate mitral valve regurgitation. No evidence of mitral stenosis.  6. Tricuspid valve regurgitation is mild to moderate.  7. The aortic valve is normal in structure. Aortic valve regurgitation is not visualized. Aortic valve sclerosis/calcification is present, without any evidence of aortic stenosis.  8. The inferior vena cava is normal in size with greater than 50% respiratory variability, suggesting right atrial pressure of 3 mmHg. Comparison(s): EF 40-45%. FINDINGS  Left Ventricle: Left ventricular ejection fraction, by estimation, is 30 to 35%. The left ventricle has moderately decreased function. The left ventricle demonstrates global hypokinesis. The average left ventricular global longitudinal strain is -4.6 %.  The global longitudinal strain is abnormal. The left ventricular internal cavity size was mildly to moderately dilated. There is mild concentric left ventricular hypertrophy. Left ventricular diastolic parameters are consistent with Grade II diastolic dysfunction (pseudonormalization). Right Ventricle: The right ventricular size is moderately enlarged. No increase in right ventricular wall thickness. Right ventricular systolic function is mildly reduced. Left Atrium: Left atrial size was mildly dilated. Right Atrium: Right atrial size was mildly dilated. Pericardium: There is no evidence of pericardial effusion. Presence of epicardial fat layer. Mitral Valve: The mitral valve is normal in structure. Mild to moderate mitral valve regurgitation. No evidence of mitral valve stenosis. Tricuspid Valve: The tricuspid valve is normal in structure. Tricuspid  valve regurgitation is mild to moderate. No evidence of tricuspid stenosis. Aortic Valve: The aortic valve is normal in structure. Aortic valve regurgitation is not visualized. Aortic valve sclerosis/calcification is present, without any evidence of aortic stenosis. Aortic valve mean gradient measures 3.0 mmHg. Aortic valve peak  gradient measures 5.4 mmHg. Aortic valve area, by VTI measures 2.71 cm. Pulmonic Valve: The pulmonic valve was normal in structure. Pulmonic valve regurgitation is not visualized. No evidence of pulmonic stenosis. Aorta: The aortic root is normal in size and structure. Venous: The inferior vena cava is normal in size with greater than 50% respiratory variability, suggesting right atrial pressure of 3 mmHg. IAS/Shunts: No atrial level shunt detected by color flow Doppler.  LEFT VENTRICLE PLAX 2D LVIDd:         6.15 cm      Diastology LVIDs:         5.40 cm      LV e' medial:    6.98 cm/s LV PW:         1.20 cm      LV E/e' medial:  10.2 LV IVS:        1.25 cm      LV e' lateral:   11.00 cm/s LVOT diam:     2.00 cm      LV E/e' lateral: 6.5 LV SV:         45 LV SV Index:   19           2D Longitudinal Strain LVOT Area:     3.14 cm     2D Strain GLS Avg:     -4.6 %  LV Volumes (MOD) LV vol d, MOD A2C: 229.0 ml 3D Volume EF: LV vol d, MOD A4C: 237.0 ml 3D EF:        39 % LV vol s, MOD A2C: 168.0 ml LV EDV:       229 ml LV vol s, MOD A4C: 150.0 ml LV ESV:  139 ml LV SV MOD A2C:     61.0 ml  LV SV:        90 ml LV SV MOD A4C:     237.0 ml LV SV MOD BP:      79.0 ml RIGHT VENTRICLE            IVC RV Basal diam:  5.60 cm    IVC diam: 1.60 cm RV Mid diam:    4.30 cm RV S prime:     9.83 cm/s TAPSE (M-mode): 1.9 cm LEFT ATRIUM             Index        RIGHT ATRIUM           Index LA diam:        4.30 cm 1.83 cm/m   RA Area:     21.10 cm LA Vol (A2C):   90.4 ml 38.53 ml/m  RA Volume:   71.90 ml  30.65 ml/m LA Vol (A4C):   83.9 ml 35.76 ml/m LA Biplane Vol: 89.8 ml 38.28 ml/m  AORTIC VALVE                     PULMONIC VALVE AV Area (Vmax):    2.76 cm     PV Vmax:       0.72 m/s AV Area (Vmean):   2.70 cm     PV Peak grad:  2.0 mmHg AV Area (VTI):     2.71 cm AV Vmax:           116.00 cm/s AV Vmean:          77.000 cm/s AV VTI:            0.166 m AV Peak Grad:      5.4 mmHg AV Mean Grad:      3.0 mmHg LVOT Vmax:         102.00 cm/s LVOT Vmean:        66.100 cm/s LVOT VTI:          0.143 m LVOT/AV VTI ratio: 0.86  AORTA Ao Root diam: 3.55 cm Ao Asc diam:  3.85 cm MITRAL VALVE MV Area (PHT): 8.92 cm    SHUNTS MV Decel Time: 85 msec     Systemic VTI:  0.14 m MV E velocity: 71.30 cm/s  Systemic Diam: 2.00 cm MV A velocity: 44.00 cm/s MV E/A ratio:  1.62 Kardie Tobb DO Electronically signed by Dub Huntsman DO Signature Date/Time: 04/27/2023/3:05:19 PM    Final    US  EKG SITE RITE Result Date: 04/27/2023 If Site Rite image not attached, placement could not be confirmed due to current cardiac rhythm.    Medications:     Current Medications:  Chlorhexidine  Gluconate Cloth  6 each Topical Daily   dapagliflozin  propanediol  10 mg Oral Daily   enoxaparin  (LOVENOX ) injection  40 mg Subcutaneous Q24H   furosemide   60 mg Intravenous Q12H   oseltamivir   30 mg Oral BID   sodium chloride  flush  10-40 mL Intracatheter Q12H    Infusions:  sodium chloride         Patient Profile   53 y/o AAM w/ h/o COVID 19 infection 03/2020, HTN, chronic renal insuffiency and chronic systolic HF due to NICM and h/o poor compliance and follow-up, admitted w/ acute hypoxic respiratory failure in the setting of a/c CHF and Influenza A.   Assessment/Plan   1. Acute on Chronic Systolic Heart Failure  - Echo  4/22 EF 20-25%, global HK, RV mildly reduced  - Cath 07/19/20 EF 15% normal cors. Preserved output. LVEDP 22 - cMRI 07/21/20  EF < 25%, RV moderately reduced.  - Echo 03/15/21: EF 40-45% - Admitted w/ NYHA Class III symptoms and pulm edea on CXR  - Echo this admit EF 30-35%, RV mildly reduced  - diuresed.  RHC today w/ RA 4, PAP 28/14 mmHg, PCW 14, TD  CO/CI 4.5 L/min, 1.9 L/min/m2, FICK 5.7 L/min, 2.4 L/min/m2  - hold evening dose of IV Lasix , anticipate transition to PO diuretics tomorrow  - GDMT currently limited by renal fx and soft BP  - add hydralazine  10 mg tid for now    2. Influenza A - supportive care - on Tamiflu    3. CKD IIIa-b - previous b/l SCr in 2022 ~1.6 - SCr 2.0 on admit, stable w/ diuresis 2.16 today  - follow BMP. Avoid hypotension    Length of Stay: 1  Landa Mullinax, PA-C  04/28/2023, 9:28 AM  Advanced Heart Failure Team Pager [phone] (M-F; 7a - 5p)  Please contact [facility] for night-coverage after hours (4p -7a ) and weekends on [email or web address]

## 2023-04-28 NOTE — Assessment & Plan Note (Addendum)
 Influenza positive. On Tamiflu 30 mg BID - Tamiflu, renal dosing

## 2023-04-28 NOTE — Assessment & Plan Note (Addendum)
 A1c in 2022 of 6.7>6.5.  CBG 100s to 120s. - CBG check with meals

## 2023-04-28 NOTE — Evaluation (Signed)
 Physical Therapy Evaluation Patient Details Name: Tony Meyer MRN: [id number] DOB: 09-Jan-1971 Today's Date: 04/28/2023  History of Present Illness  Mr. Solem is a 53 year old African-American male admitted 1/11 with progressively worsening shortness of breath and severe fatigue.  Prior to admission he reports that he was unable to walk more than 15 feet without becoming significantly short of breath and fatigue. Pt positive for influenza and CHF.  PMH: hypertension, CKD, chronic systolic heart failure currently admitted with acute on chronic systolic heart failure exacerbation.  He was diagnosed with heart failure in 2022; echocardiogram at that time with EF 15 to 20%.  Left heart cath with normal coronaries.  Cardiac MRI with a EF less than 25% and moderately reduced RV function.  By repeat appointment in December 2022 he had improvement in his EF to 40 to 45%.  Unfortunately since that time he was lost to follow-up.  Reports that he works long hours at the [employer] and has neglected his health.  Clinical Impression  Pt admitted with above diagnosis. Pt was able to ambulate in the room with supervision and no LOB with some dizziness and DOE 2/4.  O2 saturation on RA above 90% with activity.  Called nurse to report that pt sats >90% with activity as pt was on 3LO2 on arrival She agreed for pt to be titrated to 2LO2 and pt is on monitor. Pt reports he just has no energy.  Pt was orthostatic as well.  Will follow acutely. Pt currently with functional limitations due to the deficits listed below (see PT Problem List). Pt will benefit from acute skilled PT to increase their independence and safety with mobility to allow discharge.       Orthostatic BPs  Supine 118/86, 84 bpm  Sitting 89/78, 93 bpm  Standing 109/72, 95 bpm  During walking 128/86, 96 bpm      If plan is discharge home, recommend the following: A little help with walking and/or transfers;Assistance with cooking/housework;Assist for  transportation;Help with stairs or ramp for entrance   Can travel by private vehicle        Equipment Recommendations None recommended by PT  Recommendations for Other Services       Functional Status Assessment Patient has had a recent decline in their functional status and demonstrates the ability to make significant improvements in function in a reasonable and predictable amount of time.     Precautions / Restrictions Precautions Precaution Comments: DROPLET due to flu; some orthostasis Restrictions Weight Bearing Restrictions Per Provider Order: No      Mobility  Bed Mobility Overal bed mobility: Independent             General bed mobility comments: did take incr time due to dizziness    Transfers Overall transfer level: Needs assistance Equipment used: None Transfers: Sit to/from Stand Sit to Stand: Supervision           General transfer comment: Pt was able to stand without assist.  Did report dizziness intiially. See VS.    Ambulation/Gait Ambulation/Gait assistance: Contact guard assist Gait Distance (Feet): 60 Feet Assistive device: None Gait Pattern/deviations: Step-through pattern, Decreased stride length   Gait velocity interpretation: <1.31 ft/sec, indicative of household ambulator   General Gait Details: Pt with safe gait overall with reports of some mild dizziness and DOe 2/4.  Pt did not desaturate although he was winded. Ambulated on RA and notified nurse at end of session that O2 maintained well on RA therefore nurse agreed  to decr pt to 2L from 3LO2 that he was intiially on.  Stairs            Wheelchair Mobility     Tilt Bed    Modified Rankin (Stroke Patients Only)       Balance Overall balance assessment: Needs assistance Sitting-balance support: No upper extremity supported, Feet supported Sitting balance-Leahy Scale: Fair     Standing balance support: No upper extremity supported, During functional  activity Standing balance-Leahy Scale: Fair                               Pertinent Vitals/Pain Pain Assessment Pain Assessment: No/denies pain    Home Living Family/patient expects to be discharged to:: Private residence Living Arrangements: Other relatives Available Help at Discharge: Family;Available PRN/intermittently (2 cousins that are disabled) Type of Home: House Home Access: Stairs to enter   [employer] of Steps: 5   Home Layout: One level Home Equipment: None      Prior Function Prior Level of Function : Working/employed;Driving;Independent/Modified Independent             Mobility Comments: states that he was independent but had difficulty breathing and had to take rests ADLs Comments: I B/D     Extremity/Trunk Assessment   Upper Extremity Assessment Upper Extremity Assessment: Defer to OT evaluation    Lower Extremity Assessment Lower Extremity Assessment: Overall WFL for tasks assessed    Cervical / Trunk Assessment Cervical / Trunk Assessment: Normal  Communication   Communication Communication: No apparent difficulties  Cognition Arousal: Alert Behavior During Therapy: WFL for tasks assessed/performed Overall Cognitive Status: Within Functional Limits for tasks assessed                                          General Comments      Exercises     Assessment/Plan    PT Assessment Patient needs continued PT services  PT Problem List Decreased activity tolerance;Decreased balance;Decreased mobility;Decreased knowledge of use of DME;Decreased safety awareness;Cardiopulmonary status limiting activity       PT Treatment Interventions DME instruction;Gait training;Functional mobility training;Therapeutic activities;Therapeutic exercise;Balance training;Patient/family education;Stair training    PT Goals (Current goals can be found in the Care Plan section)  Acute Rehab PT Goals Patient Stated Goal:  to go home PT Goal Formulation: With patient Time For Goal Achievement: 05/12/23 Potential to Achieve Goals: Good    Frequency Min 1X/week     Co-evaluation               AM-PAC PT 6 Clicks Mobility  Outcome Measure Help needed turning from your back to your side while in a flat bed without using bedrails?: None Help needed moving from lying on your back to sitting on the side of a flat bed without using bedrails?: A Little Help needed moving to and from a bed to a chair (including a wheelchair)?: A Little Help needed standing up from a chair using your arms (e.g., wheelchair or bedside chair)?: A Little Help needed to walk in hospital room?: A Little Help needed climbing 3-5 steps with a railing? : A Lot 6 Click Score: 18    End of Session Equipment Utilized During Treatment: Gait belt;Oxygen Activity Tolerance: Patient limited by fatigue Patient left: with call bell/phone within reach (sitting on EOB) Nurse Communication: Mobility status (  Titrating O2 from 3L to 2L as pt did not desaturate) PT Visit Diagnosis: Unsteadiness on feet (R26.81);Muscle weakness (generalized) (M62.81)    Time: 8496-8470 PT Time Calculation (min) (ACUTE ONLY): 26 min   Charges:   PT Evaluation $PT Eval Moderate Complexity: 1 Mod PT Treatments $Gait Training: 8-22 mins PT General Charges $$ ACUTE PT VISIT: 1 Visit         Malayzia Laforte M,PT Acute Rehab Services [phone]   Stephane JULIANNA Bevel 04/28/2023, 5:47 PM

## 2023-04-28 NOTE — Plan of Care (Signed)
  Problem: Education: Goal: Knowledge of General Education information will improve Description: Including pain rating scale, medication(s)/side effects and non-pharmacologic comfort measures Outcome: Progressing   Problem: Health Behavior/Discharge Planning: Goal: Ability to manage health-related needs will improve Outcome: Progressing   Problem: Clinical Measurements: Goal: Ability to maintain clinical measurements within normal limits will improve Outcome: Progressing Goal: Will remain free from infection Outcome: Progressing Goal: Diagnostic test results will improve Outcome: Progressing Goal: Respiratory complications will improve Outcome: Progressing Goal: Cardiovascular complication will be avoided Outcome: Progressing   Problem: Activity: Goal: Risk for activity intolerance will decrease Outcome: Progressing   Problem: Nutrition: Goal: Adequate nutrition will be maintained Outcome: Progressing   Problem: Coping: Goal: Level of anxiety will decrease Outcome: Progressing   Problem: Elimination: Goal: Will not experience complications related to bowel motility Outcome: Progressing Goal: Will not experience complications related to urinary retention Outcome: Progressing   Problem: Pain Management: Goal: General experience of comfort will improve Outcome: Progressing   Problem: Safety: Goal: Ability to remain free from injury will improve Outcome: Progressing   Problem: Skin Integrity: Goal: Risk for impaired skin integrity will decrease Outcome: Progressing   Problem: Education: Goal: Ability to demonstrate management of disease process will improve Outcome: Progressing Goal: Ability to verbalize understanding of medication therapies will improve Outcome: Progressing Goal: Individualized Educational Video(s) Outcome: Progressing   Problem: Activity: Goal: Capacity to carry out activities will improve Outcome: Progressing   Problem: Cardiac: Goal:  Ability to achieve and maintain adequate cardiopulmonary perfusion will improve Outcome: Progressing   Problem: Education: Goal: Understanding of CV disease, CV risk reduction, and recovery process will improve Outcome: Progressing Goal: Individualized Educational Video(s) Outcome: Progressing   Problem: Activity: Goal: Ability to return to baseline activity level will improve Outcome: Progressing   Problem: Cardiovascular: Goal: Ability to achieve and maintain adequate cardiovascular perfusion will improve Outcome: Progressing Goal: Vascular access site(s) Level 0-1 will be maintained Outcome: Progressing   Problem: Health Behavior/Discharge Planning: Goal: Ability to safely manage health-related needs after discharge will improve Outcome: Progressing

## 2023-04-28 NOTE — Progress Notes (Signed)
 PT Cancellation Note  Patient Details Name: Tony Meyer MRN: [id number] DOB: 1970-07-04   Cancelled Treatment:    Reason Eval/Treat Not Completed: Medical issues which prohibited therapy (Tech in room to take pt to heart Cath as PT arrived. Will be on bedrest after cath. Will check back tomorrow.)   Stephane JULIANNA Bevel 04/28/2023, 10:41 AM Omeed Osuna M,PT Acute Rehab Services [phone]

## 2023-04-29 ENCOUNTER — Encounter (HOSPITAL_COMMUNITY): Payer: Self-pay | Admitting: Cardiology

## 2023-04-29 DIAGNOSIS — E1165 Type 2 diabetes mellitus with hyperglycemia: Secondary | ICD-10-CM

## 2023-04-29 DIAGNOSIS — Z794 Long term (current) use of insulin: Secondary | ICD-10-CM

## 2023-04-29 DIAGNOSIS — I5023 Acute on chronic systolic (congestive) heart failure: Secondary | ICD-10-CM | POA: Diagnosis not present

## 2023-04-29 DIAGNOSIS — J101 Influenza due to other identified influenza virus with other respiratory manifestations: Secondary | ICD-10-CM | POA: Diagnosis not present

## 2023-04-29 LAB — COMPREHENSIVE METABOLIC PANEL
ALT: 120 U/L — ABNORMAL HIGH (ref 0–44)
AST: 54 U/L — ABNORMAL HIGH (ref 15–41)
Albumin: 2.7 g/dL — ABNORMAL LOW (ref 3.5–5.0)
Alkaline Phosphatase: 38 U/L (ref 38–126)
Anion gap: 9 (ref 5–15)
BUN: 37 mg/dL — ABNORMAL HIGH (ref 6–20)
CO2: 25 mmol/L (ref 22–32)
Calcium: 8 mg/dL — ABNORMAL LOW (ref 8.9–10.3)
Chloride: 103 mmol/L (ref 98–111)
Creatinine, Ser: 1.83 mg/dL — ABNORMAL HIGH (ref 0.61–1.24)
GFR, Estimated: 43 mL/min — ABNORMAL LOW (ref >60)
Glucose, Bld: 123 mg/dL — ABNORMAL HIGH (ref 70–99)
Potassium: 4 mmol/L (ref 3.5–5.1)
Sodium: 137 mmol/L (ref 135–145)
Total Bilirubin: 0.5 mg/dL (ref 0.0–1.2)
Total Protein: 6.4 g/dL — ABNORMAL LOW (ref 6.5–8.1)

## 2023-04-29 LAB — CBC
HCT: 46.5 % (ref 39.0–52.0)
Hemoglobin: 15.7 g/dL (ref 13.0–17.0)
MCH: 29.5 pg (ref 26.0–34.0)
MCHC: 33.8 g/dL (ref 30.0–36.0)
MCV: 87.2 fL (ref 80.0–100.0)
Platelets: 130 10*3/uL — ABNORMAL LOW (ref 150–400)
RBC: 5.33 MIL/uL (ref 4.22–5.81)
RDW: 14.1 % (ref 11.5–15.5)
WBC: 7.2 10*3/uL (ref 4.0–10.5)
nRBC: 0 % (ref 0.0–0.2)

## 2023-04-29 LAB — MAGNESIUM: Magnesium: 2.5 mg/dL — ABNORMAL HIGH (ref 1.7–2.4)

## 2023-04-29 LAB — GLUCOSE, CAPILLARY
Glucose-Capillary: 120 mg/dL — ABNORMAL HIGH (ref 70–99)
Glucose-Capillary: 124 mg/dL — ABNORMAL HIGH (ref 70–99)

## 2023-04-29 MED ORDER — DEXTROMETHORPHAN POLISTIREX ER 30 MG/5ML PO SUER
60.0000 mg | Freq: Two times a day (BID) | ORAL | Status: DC
  Administered 2023-04-29 – 2023-04-30 (×3): 60 mg via ORAL
  Filled 2023-04-29 (×4): qty 10

## 2023-04-29 MED ORDER — METOPROLOL SUCCINATE ER 25 MG PO TB24
12.5000 mg | ORAL_TABLET | Freq: Every day | ORAL | Status: DC
  Administered 2023-04-29 – 2023-04-30 (×2): 12.5 mg via ORAL
  Filled 2023-04-29 (×2): qty 1

## 2023-04-29 MED ORDER — ORAL CARE MOUTH RINSE: 15.0000 mL | OROMUCOSAL | Status: DC | PRN

## 2023-04-29 NOTE — Assessment & Plan Note (Addendum)
 Maintaining adequate SpO2, normal work of breathing improved, now on RA. Wheezing persists with diffuse crackles bilaterally.  Plan to add back GDMT as blood pressure tolerates (restarting Farxiga  and Metoprolol  today, restart ARB tomorrow).  Echo demonstrated LVEF 30-35%, LV global hypokinesis, mildly-moderately dilated with mild LVH; G2DD; LA and RA mildly dilated; mild-mod MVR and TVR. - Cardiology consulted, appreciate recommendations -  R heart catheterization today demonstrating moderate-severely reduced cardiac index - DuoNebs every 4 hours PRN - Hold diuresis - Start Toprol  XL 12.5 mg daily and continue Farxiga  10 mg, restart ARB tomorrow - Will hold on Coreg , Spironolactone  and Entresto  in setting of soft blood pressures - Strict I/O - Daily weights - AM BMP, Mag with goal K>4, Mg>2

## 2023-04-29 NOTE — Progress Notes (Addendum)
 Advanced Heart Failure Rounding Note  Cardiologist: Alvan Carrier, MD  Chief Complaint:  Subjective:    1/13: RHC with low filling pressures>>adequately diuresed. Cardiac index 1.9 by thermodilution and 2.4 by Fick. Diuretics held.   Today, Wt stable off diuretics. SBPs low 100s on hydralazine . Scr much improved, 2.16>>1.83.  Repeat CXR yesterday showed improving heterogeneous bilateral airspace opacities.  Sitting up on side of bed. Still feels a bit SOB and fatigued but wheezing overall improved.     RHC 1/13 RA:                  4 mmHg (mean) RV:                  31/2-4 mmHg PA:                  28/14 mmHg (21 mean) PCWP:            14 mmHg (mean)                                      Estimated Fick CO/CI   5.7 L/min, 2.4 L/min/m2 Thermodilution CO/CI   4.5 L/min, 1.9 L/min/m2                                              TPG                 7  mmHg                                              PVR                 1.2-1.6 Wood Units  PAPi                3.5        Objective:   Weight Range: 106.9 kg Body mass index is 30.26 kg/m.   Vital Signs:   Temp:  [97.5 F (36.4 C)-98.1 F (36.7 C)] 98 F (36.7 C) (01/14 0334) Pulse Rate:  [0-95] 61 (01/14 0735) Resp:  [17-26] 26 (01/14 0735) BP: (88-135)/(66-117) 104/79 (01/14 0735) SpO2:  [92 %-100 %] 96 % (01/14 0735) Weight:  [106.9 kg] 106.9 kg (01/14 0622) Last BM Date : 04/28/23  Weight change: Filed Weights   04/27/23 0544 04/28/23 0545 04/29/23 0622  Weight: 108.6 kg 107 kg 106.9 kg    Intake/Output:   Intake/Output Summary (Last 24 hours) at 04/29/2023 0804 Last data filed at 04/29/2023 0124 Gross per 24 hour  Intake --  Output 775 ml  Net -775 ml      Physical Exam    General:  Well appearing. No resp difficulty HEENT: Normal Neck: Supple. JVP not elevated . Carotids 2+ bilat; no bruits. No lymphadenopathy or thyromegaly appreciated. Cor: PMI nondisplaced. Regular rate & rhythm. No rubs,  gallops or murmurs. Lungs: decreased BS at the bases bilaterally  Abdomen: Soft, nontender, nondistended. No hepatosplenomegaly. No bruits or masses. Good bowel sounds. Extremities: No cyanosis, clubbing, rash, edema Neuro: Alert & orientedx3, cranial nerves grossly intact. moves all 4 extremities w/o difficulty. Affect pleasant   Telemetry  NSR 90s w/ occasional PVCs   EKG    N/A   Labs    CBC Recent Labs    04/26/23 1023 04/28/23 1036 04/28/23 1037 04/29/23 0450  WBC 5.4  --   --  7.2  HGB 14.9   < > 16.3 15.7  HCT 44.3   < > 48.0 46.5  MCV 88.2  --   --  87.2  PLT 158  --   --  130*   < > = values in this interval not displayed.   Basic Metabolic Panel Recent Labs    98/86/74 0610 04/28/23 1036 04/28/23 1037 04/29/23 0450  NA 135   < > 138 137  K 4.1   < > 4.1 4.0  CL 101  --   --  103  CO2 21*  --   --  25  GLUCOSE 106*  --   --  123*  BUN 41*  --   --  37*  CREATININE 2.16*  --   --  1.83*  CALCIUM  8.1*  --   --  8.0*  MG 2.5*  --   --  2.5*   < > = values in this interval not displayed.   Liver Function Tests Recent Labs    04/28/23 0610 04/29/23 0450  AST 78* 54*  ALT 162* 120*  ALKPHOS 37* 38  BILITOT 0.8 0.5  PROT 6.4* 6.4*  ALBUMIN 2.8* 2.7*   No results for input(s): LIPASE, AMYLASE in the last 72 hours. Cardiac Enzymes No results for input(s): CKTOTAL, CKMB, CKMBINDEX, TROPONINI in the last 72 hours.  BNP: BNP (last 3 results) Recent Labs    04/26/23 1023  BNP 584.7*    ProBNP (last 3 results) No results for input(s): PROBNP in the last 8760 hours.   D-Dimer No results for input(s): DDIMER in the last 72 hours. Hemoglobin A1C Recent Labs    04/27/23 1017  HGBA1C 6.5*   Fasting Lipid Panel No results for input(s): CHOL, HDL, LDLCALC, TRIG, CHOLHDL, LDLDIRECT in the last 72 hours. Thyroid  Function Tests No results for input(s): TSH, T4TOTAL, T3FREE, THYROIDAB in the last 72  hours.  Invalid input(s): FREET3  Other results:   Imaging    DG CHEST PORT 1 VIEW Result Date: 04/28/2023 CLINICAL DATA:  Pneumonia.  History of heart failure. EXAM: PORTABLE CHEST 1 VIEW COMPARISON:  04/26/2023 FINDINGS: Improving heterogeneous bilateral airspace opacities. Greatest residual is in the left mid lung and lesser in the right infrahilar region. Similar cardiomegaly. No pneumothorax or large pleural effusion. Right upper extremity PICC tip in the SVC. IMPRESSION: 1. Improving heterogeneous bilateral airspace opacities, mild residual in the left mid lung and right infrahilar region. 2. Similar cardiomegaly. Electronically Signed   By: Andrea Gasman M.D.   On: 04/28/2023 20:21   CARDIAC CATHETERIZATION Result Date: 04/28/2023 HEMODYNAMICS: RA:   4 mmHg (mean) RV:   31/2-4 mmHg PA:   28/14 mmHg (21 mean) PCWP:  14 mmHg (mean)    Estimated Fick CO/CI   5.7 L/min, 2.4 L/min/m2 Thermodilution CO/CI   4.5 L/min, 1.9 L/min/m2    TPG    7  mmHg     PVR     1.2-1.6 Wood Units PAPi      3.5  IMPRESSION: Normal pre and post capillary filling pressures Normal PA mean Moderate to severely reduced cardiac index Normal PVR Aditya Sabharwal 10:49 AM     Medications:     Scheduled Medications:  Chlorhexidine  Gluconate Cloth  6  each Topical Daily   dapagliflozin  propanediol  10 mg Oral Daily   enoxaparin  (LOVENOX ) injection  40 mg Subcutaneous Q24H   hydrALAZINE   10 mg Oral TID   oseltamivir   30 mg Oral BID   sodium chloride  flush  10-40 mL Intracatheter Q12H    Infusions:   PRN Medications: ipratropium-albuterol , mouth rinse, sodium chloride  flush    Patient Profile   53 y/o AAM w/ h/o COVID 19 infection 03/2020, HTN, chronic renal insuffiency and chronic systolic HF due to NICM and h/o poor compliance and follow-up, admitted w/ acute hypoxic respiratory failure in the setting of a/c CHF and Influenza A.   Assessment/Plan   1. Acute on Chronic Systolic Heart Failure  -  Echo 4/22 EF 20-25%, global HK, RV mildly reduced  - Cath 07/19/20 EF 15% normal cors. Preserved output. LVEDP 22 - cMRI 07/21/20  EF < 25%, RV moderately reduced.  - Echo 03/15/21: EF 40-45% - Admitted w/ NYHA Class III symptoms and pulm edema on CXR  - Echo this admit EF 30-35%, RV mildly reduced  - well diuresed. RHC 1/13 w/ RA 4, PAP 28/14 mmHg, PCW 14, TD  CO/CI 4.5 L/min, 1.9 L/min/m2, FICK 5.7 L/min, 2.4 L/min/m2  - GDMT currently limited by renal fx and soft BP. Scr improving   - continue hydralazine  10 mg tid for now  - continue to hold diuretics. Possible SGLT2i tomorrow - plan ARB/ARNi tomorrow if SCr/ BP allows  - follow BMP      2. Influenza A - supportive care - on Tamiflu     3. 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 Avoid hypotension     Length of Stay: 2  Caffie Shed, PA-C  04/29/2023, 8:04 AM  Advanced Heart Failure Team Pager [phone] (M-F; 7a - 5p)  Please contact [facility] for night-coverage after hours (5p -7a ) and weekends on [email or web address]

## 2023-04-29 NOTE — Assessment & Plan Note (Signed)
 Troponins trended flat, patient has mild chest pain.  If chest pain worsens, repeat troponins and obtain EKG.

## 2023-04-29 NOTE — Assessment & Plan Note (Addendum)
 Influenza positive. On Tamiflu 30 mg BID - Tamiflu, renal dosing - Delsym for cough

## 2023-04-29 NOTE — Assessment & Plan Note (Signed)
 A1c in 2022 of 6.7>6.5.  CBG 100s to 120s. - CBG check with meals

## 2023-04-29 NOTE — Progress Notes (Signed)
 Daily Progress Note Intern Pager: [phone]  Patient name: Tony Meyer Medical record number: [id number] Date of birth: 03-24-1971 Age: 53 y.o. Gender: male  Primary Care Provider: Patient, No Pcp Per Consultants: Cardiology  Code Status: DNR/DNI   Pt Overview and Major Events to Date:  01/11: Admitted  1/13: R heart cath  Assessment and Plan: 53 year old male with PMHx of HFrEF, HTN, and T2DM presenting with worsening shortness of breath.  Suspect SOB 2/2 acute CHF in setting of influenza infection. Assessment & Plan Acute on chronic systolic heart failure (HCC) Maintaining adequate SpO2, normal work of breathing improved, now on RA. Wheezing persists with diffuse crackles bilaterally.  Plan to add back GDMT as blood pressure tolerates (restarting Farxiga  and Metoprolol  today, restart ARB tomorrow).  Echo demonstrated LVEF 30-35%, LV global hypokinesis, mildly-moderately dilated with mild LVH; G2DD; LA and RA mildly dilated; mild-mod MVR and TVR. - Cardiology consulted, appreciate recommendations -  R heart catheterization today demonstrating moderate-severely reduced cardiac index - DuoNebs every 4 hours PRN - Hold diuresis - Start Toprol  XL 12.5 mg daily and continue Farxiga  10 mg, restart ARB tomorrow - Will hold on Coreg , Spironolactone  and Entresto  in setting of soft blood pressures - Strict I/O - Daily weights - AM BMP, Mag with goal K>4, Mg>2 Elevated troponin Troponins trended flat, patient has mild chest pain.  If chest pain worsens, repeat troponins and obtain EKG. AKI on CKD3a Creatinine stable at 2.04>2.16>1.83, likely 2/2 third spacing and continue diuresis.  Given distended abdomen with low back pain, will obtain UA to assess for proteinuria. - PO fluids - Hold diuresis  Elevated LFTs Suspect 2/2 vascular congestion, down-trending. Patient does have risk factors for MASLD (obesity, type 2 diabetes, hypertension).  Low albumin.  Will assess for liver  disease.RUQ US  demonstrated incidental finding of R kidney cysts.  Type 2 diabetes mellitus with hyperglycemia (HCC) A1c in 2022 of 6.7>6.5.  CBG 100s to 120s. - CBG check with meals Influenza A with respiratory manifestations Influenza positive. On Tamiflu  30 mg BID - Tamiflu , renal dosing - Delsym  for cough  FEN/GI: Heart healthy PPx: Lovenox  Dispo: Pending PT recommendations and clinical improvement   Subjective:  Overnight he had some coughing fits that was disrupting his sleep. Patient is doing better this morning and is off O2. He denies any chest pain, SOB, or abdominal pain. He continues to have abdominal distention.   Objective: Temp:  [97.5 F (36.4 C)-98.1 F (36.7 C)] 98 F (36.7 C) (01/14 0334) Pulse Rate:  [0-95] 61 (01/14 0735) Resp:  [17-25] 20 (01/14 0735) BP: (88-135)/(66-117) 104/79 (01/14 0735) SpO2:  [92 %-100 %] 96 % (01/14 0735) Weight:  [106.9 kg] 106.9 kg (01/14 0622) Physical Exam: General: Sitting up in bed in NAD Cardiovascular: RRR. No M/R/G. Respiratory: Bibasilar crackles and diffuse end expiratory wheezing.  Normal WOB on RA. No diminished breath sounds. Abdomen: Soft, non-tender, distended. Normoactive bowel sounds. Extremities: Moving all 4 extremities, no BLE edema.  Laboratory: Most recent CBC Lab Results  Component Value Date   WBC 7.2 04/29/2023   HGB 15.7 04/29/2023   HCT 46.5 04/29/2023   MCV 87.2 04/29/2023   PLT 130 (L) 04/29/2023   Most recent BMP    Latest Ref Rng & Units 04/29/2023    4:50 AM  BMP  Glucose 70 - 99 mg/dL 876   BUN 6 - 20 mg/dL 37   Creatinine 9.38 - 1.24 mg/dL 8.16   Sodium 864 - 854 mmol/L  137   Potassium 3.5 - 5.1 mmol/L 4.0   Chloride 98 - 111 mmol/L 103   CO2 22 - 32 mmol/L 25   Calcium  8.9 - 10.3 mg/dL 8.0    Mag: 2.5  Imaging/Diagnostic Tests:  CXR: 1. Improving heterogeneous bilateral airspace opacities, mild residual in the left mid lung and right infrahilar region. 2. Similar  cardiomegaly.  Cath: moderate-severely reduced cardiac index  Janna Ferrier, DO 04/29/2023, 8:22 AM PGY-1, [facility]  FPTS Intern pager: [phone], text pages welcome Secure chat group [facility] Teaching Service

## 2023-04-29 NOTE — Progress Notes (Signed)
 Mobility Specialist Progress Note:   04/29/23 1214  Mobility  Activity Ambulated with assistance in room  Level of Assistance Standby assist, set-up cues, supervision of patient - no hands on  Assistive Device Other (Comment) (IV Pole)  Distance Ambulated (ft) 80 ft  Activity Response Tolerated well  Mobility Referral Yes  Mobility visit 1 Mobility  Mobility Specialist Start Time (ACUTE ONLY) 1132  Mobility Specialist Stop Time (ACUTE ONLY) 1144  Mobility Specialist Time Calculation (min) (ACUTE ONLY) 12 min   Pt received in bed agreeable to mobility. No physical assistance needed throughout session. Took BP throughout session (see below). No c/o or dizziness. Returned to seated EOB w/ call bell and personal belongings in reach.    Pre Mobility BP 105/78 During Mobility BP 93/73 Post Mobility  BP 109/77  Thersia Minder Mobility Specialist  Please contact vis Secure Chat or  Rehab Office [phone]

## 2023-04-29 NOTE — Assessment & Plan Note (Signed)
 Suspect 2/2 vascular congestion, down-trending. Patient does have risk factors for MASLD (obesity, type 2 diabetes, hypertension).  Low albumin.  Will assess for liver disease.RUQ US demonstrated incidental finding of R kidney cysts.

## 2023-04-29 NOTE — Evaluation (Signed)
 Occupational Therapy Evaluation Patient Details Name: Tony Meyer MRN: [id number] DOB: [birth year] Today's Date: 04/29/2023   History of Present Illness Tony Meyer is a 53 year old African-American male admitted 1/11 with progressively worsening shortness of breath and severe fatigue.  Prior to admission he reports that he was unable to walk more than 15 feet without becoming significantly short of breath and fatigue. Pt positive for influenza and CHF.  PMH: hypertension, CKD, chronic systolic heart failure currently admitted with acute on chronic systolic heart failure exacerbation.  He was diagnosed with heart failure in 2022; echocardiogram at that time with EF 15 to 20%.  Left heart cath with normal coronaries.  Cardiac MRI with a EF less than 25% and moderately reduced RV function.  By repeat appointment in December 2022 he had improvement in his EF to 40 to 45%.  Unfortunately since that time he was lost to follow-up.  Reports that he works long hours at the [employer] and has neglected his health.   Clinical Impression   PTA, pt was mod I for ADL, IADL, working, and driving. Upon eval, pt with decreased activity tolerance. Pt scored a 19 on the Short Blessed Test of cognitive function indicating cognitive impairment. Pt did mention that he does not normally do a lot of problem solving, so further assessment of compensatory strategies may be needed as pt does feel he his at his baseline. Pt able to perform BADL with supervision this session on RA with increased time. Will follow to optimize activity tolerance and continue cognitive assessment but do not suspect need for follow up OT after discharge.       If plan is discharge home, recommend the following: A little help with walking and/or transfers;A little help with bathing/dressing/bathroom;Help with stairs or ramp for entrance;Assist for transportation;Assistance with cooking/housework;Direct supervision/assist for medications  management;Direct supervision/assist for financial management    Functional Status Assessment  Patient has had a recent decline in their functional status and demonstrates the ability to make significant improvements in function in a reasonable and predictable amount of time.  Equipment Recommendations  None recommended by OT    Recommendations for Other Services       Precautions / Restrictions Precautions Precaution Comments: DROPLET due to flu; some orthostasis Restrictions Weight Bearing Restrictions Per Provider Order: No      Mobility Bed Mobility Overal bed mobility: Independent                  Transfers Overall transfer level: Needs assistance Equipment used: None Transfers: Sit to/from Stand Sit to Stand: Supervision           General transfer comment: for safety only      Balance Overall balance assessment: Needs assistance Sitting-balance support: No upper extremity supported, Feet supported Sitting balance-Leahy Scale: Fair     Standing balance support: No upper extremity supported, During functional activity Standing balance-Leahy Scale: Fair                             ADL either performed or assessed with clinical judgement   ADL Overall ADL's : Needs assistance/impaired Eating/Feeding: Independent   Grooming: Supervision/safety;Standing   Upper Body Bathing: Independent;Sitting   Lower Body Bathing: Supervison/ safety;Sit to/from stand   Upper Body Dressing : Independent;Sitting   Lower Body Dressing: Supervision/safety;Sit to/from stand   Toilet Transfer: Supervision/safety;Ambulation           Functional mobility during ADLs: Supervision/safety  Vision Baseline Vision/History: 1 Wears glasses Ability to See in Adequate Light: 0 Adequate Patient Visual Report: No change from baseline Vision Assessment?: No apparent visual deficits     Perception Perception: Not tested       Praxis Praxis: Not  tested       Pertinent Vitals/Pain Pain Assessment Pain Assessment: No/denies pain     Extremity/Trunk Assessment Upper Extremity Assessment Upper Extremity Assessment: Overall WFL for tasks assessed   Lower Extremity Assessment Lower Extremity Assessment: Defer to PT evaluation   Cervical / Trunk Assessment Cervical / Trunk Assessment: Normal   Communication Communication Communication: No apparent difficulties   Cognition Arousal: Alert Behavior During Therapy: WFL for tasks assessed/performed Overall Cognitive Status: Impaired/Different from baseline                                 General Comments: Pt scoring at 19 on the short blessed test of cognition consistent with cognitive impairmnent consistent with dementia per score sheet interpretation guidelines. Pt also with max difficulty with money management problems in his head (100-57). Pt reports mod I with medication management, working, driving, finances at home, but denies his cognition today during testing being different than his baseline.     General Comments  2 L O2 on arrival but able to maintain SpO2 >92% on RA during session. BP supine: 111/90 (99) HR 87; seated 109/85 (102) HR 93 EOB (did take prolonged time to get reading); standing 100/78 (87) HR 93; after mobility seated EOB 123/88 (98) HR 93. pt reports he prioritizes work and neglects his health but plans to change this.    Exercises     Shoulder Instructions      Home Living Family/patient expects to be discharged to:: Private residence Living Arrangements: Other relatives Available Help at Discharge: Family;Available PRN/intermittently (2 cousins that are disabled) Type of Home: House Home Access: Stairs to enter [employer] of Steps: 5   Home Layout: One level     Bathroom Shower/Tub: Runner, Broadcasting/film/video: None          Prior Functioning/Environment Prior Level of Function :  Working/employed;Driving;Independent/Modified Independent             Mobility Comments: states that he was independent but had difficulty breathing and had to take rests ADLs Comments: I B/D        OT Problem List: Decreased activity tolerance;Impaired balance (sitting and/or standing);Decreased cognition;Cardiopulmonary status limiting activity      OT Treatment/Interventions: Self-care/ADL training;Therapeutic exercise    OT Goals(Current goals can be found in the care plan section) Acute Rehab OT Goals Patient Stated Goal: go home OT Goal Formulation: With patient Time For Goal Achievement: 05/13/23 Potential to Achieve Goals: Good  OT Frequency: Min 1X/week    Co-evaluation              AM-PAC OT 6 Clicks Daily Activity     Outcome Measure Help from another person eating meals?: None Help from another person taking care of personal grooming?: A Little Help from another person toileting, which includes using toliet, bedpan, or urinal?: A Little Help from another person bathing (including washing, rinsing, drying)?: A Little Help from another person to put on and taking off regular upper body clothing?: None Help from another person to put on and taking off regular lower body clothing?: A Little 6 Click Score: 20  End of Session Equipment Utilized During Treatment: Gait belt Nurse Communication: Mobility status  Activity Tolerance: Patient tolerated treatment well Patient left: in bed;with call bell/phone within reach  OT Visit Diagnosis: Unsteadiness on feet (R26.81);Other symptoms and signs involving cognitive function (unsure if cognition at baseline)                Time: 9149-9080 OT Time Calculation (min): 29 min Charges:  OT General Charges $OT Visit: 1 Visit OT Evaluation $OT Eval Low Complexity: 1 Low OT Treatments $Self Care/Home Management : 8-22 mins  Elma JONETTA Lebron FREDERICK, OTR/L [facility] Acute Rehabilitation Office: [phone]   Elma JONETTA Lebron 04/29/2023, 1:19 PM

## 2023-04-29 NOTE — Assessment & Plan Note (Addendum)
 Creatinine stable at 2.04>2.16>1.83, likely 2/2 third spacing and continue diuresis.  Given distended abdomen with low back pain, will obtain UA to assess for proteinuria. - PO fluids - Hold diuresis

## 2023-04-29 NOTE — Plan of Care (Signed)
  Problem: Education: Goal: Knowledge of General Education information will improve Description: Including pain rating scale, medication(s)/side effects and non-pharmacologic comfort measures Outcome: Progressing   Problem: Health Behavior/Discharge Planning: Goal: Ability to manage health-related needs will improve Outcome: Progressing   Problem: Clinical Measurements: Goal: Ability to maintain clinical measurements within normal limits will improve Outcome: Progressing Goal: Will remain free from infection Outcome: Progressing Goal: Diagnostic test results will improve Outcome: Progressing Goal: Respiratory complications will improve Outcome: Progressing Goal: Cardiovascular complication will be avoided Outcome: Progressing   Problem: Activity: Goal: Risk for activity intolerance will decrease Outcome: Progressing   Problem: Nutrition: Goal: Adequate nutrition will be maintained Outcome: Progressing   Problem: Coping: Goal: Level of anxiety will decrease Outcome: Progressing   Problem: Elimination: Goal: Will not experience complications related to bowel motility Outcome: Progressing Goal: Will not experience complications related to urinary retention Outcome: Progressing   Problem: Pain Management: Goal: General experience of comfort will improve Outcome: Progressing   Problem: Safety: Goal: Ability to remain free from injury will improve Outcome: Progressing   Problem: Skin Integrity: Goal: Risk for impaired skin integrity will decrease Outcome: Progressing   Problem: Education: Goal: Ability to demonstrate management of disease process will improve Outcome: Progressing Goal: Ability to verbalize understanding of medication therapies will improve Outcome: Progressing Goal: Individualized Educational Video(s) Outcome: Progressing   Problem: Activity: Goal: Capacity to carry out activities will improve Outcome: Progressing   Problem: Cardiac: Goal:  Ability to achieve and maintain adequate cardiopulmonary perfusion will improve Outcome: Progressing   Problem: Education: Goal: Understanding of CV disease, CV risk reduction, and recovery process will improve Outcome: Progressing Goal: Individualized Educational Video(s) Outcome: Progressing   Problem: Activity: Goal: Ability to return to baseline activity level will improve Outcome: Progressing   Problem: Cardiovascular: Goal: Ability to achieve and maintain adequate cardiovascular perfusion will improve Outcome: Progressing Goal: Vascular access site(s) Level 0-1 will be maintained Outcome: Progressing   Problem: Health Behavior/Discharge Planning: Goal: Ability to safely manage health-related needs after discharge will improve Outcome: Progressing

## 2023-04-30 ENCOUNTER — Other Ambulatory Visit (HOSPITAL_COMMUNITY): Payer: Self-pay

## 2023-04-30 DIAGNOSIS — I5023 Acute on chronic systolic (congestive) heart failure: Secondary | ICD-10-CM | POA: Diagnosis not present

## 2023-04-30 LAB — CBC
HCT: 44.6 % (ref 39.0–52.0)
Hemoglobin: 14.8 g/dL (ref 13.0–17.0)
MCH: 29 pg (ref 26.0–34.0)
MCHC: 33.2 g/dL (ref 30.0–36.0)
MCV: 87.3 fL (ref 80.0–100.0)
Platelets: 137 10*3/uL — ABNORMAL LOW (ref 150–400)
RBC: 5.11 MIL/uL (ref 4.22–5.81)
RDW: 13.8 % (ref 11.5–15.5)
WBC: 5.6 10*3/uL (ref 4.0–10.5)
nRBC: 0 % (ref 0.0–0.2)

## 2023-04-30 LAB — COMPREHENSIVE METABOLIC PANEL
ALT: 86 U/L — ABNORMAL HIGH (ref 0–44)
AST: 36 U/L (ref 15–41)
Albumin: 2.7 g/dL — ABNORMAL LOW (ref 3.5–5.0)
Alkaline Phosphatase: 38 U/L (ref 38–126)
Anion gap: 9 (ref 5–15)
BUN: 21 mg/dL — ABNORMAL HIGH (ref 6–20)
CO2: 25 mmol/L (ref 22–32)
Calcium: 8.1 mg/dL — ABNORMAL LOW (ref 8.9–10.3)
Chloride: 105 mmol/L (ref 98–111)
Creatinine, Ser: 1.37 mg/dL — ABNORMAL HIGH (ref 0.61–1.24)
GFR, Estimated: >60 mL/min (ref >60)
Glucose, Bld: 131 mg/dL — ABNORMAL HIGH (ref 70–99)
Potassium: 3.7 mmol/L (ref 3.5–5.1)
Sodium: 139 mmol/L (ref 135–145)
Total Bilirubin: 0.8 mg/dL (ref 0.0–1.2)
Total Protein: 6 g/dL — ABNORMAL LOW (ref 6.5–8.1)

## 2023-04-30 LAB — GLUCOSE, CAPILLARY: Glucose-Capillary: 136 mg/dL — ABNORMAL HIGH (ref 70–99)

## 2023-04-30 LAB — MAGNESIUM: Magnesium: 2.5 mg/dL — ABNORMAL HIGH (ref 1.7–2.4)

## 2023-04-30 MED ORDER — OSELTAMIVIR PHOSPHATE 75 MG PO CAPS
75.0000 mg | ORAL_CAPSULE | Freq: Two times a day (BID) | ORAL | Status: DC
  Administered 2023-04-30: 75 mg via ORAL
  Filled 2023-04-30 (×2): qty 1

## 2023-04-30 MED ORDER — POTASSIUM CHLORIDE CRYS ER 20 MEQ PO TBCR
40.0000 meq | EXTENDED_RELEASE_TABLET | Freq: Once | ORAL | Status: AC
  Administered 2023-04-30: 40 meq via ORAL
  Filled 2023-04-30: qty 2

## 2023-04-30 MED ORDER — LOSARTAN POTASSIUM 25 MG PO TABS
12.5000 mg | ORAL_TABLET | Freq: Every day | ORAL | 0 refills | Status: DC
  Filled 2023-04-30: qty 15, 30d supply, fill #0

## 2023-04-30 MED ORDER — METOPROLOL SUCCINATE ER 25 MG PO TB24
12.5000 mg | ORAL_TABLET | Freq: Every day | ORAL | 1 refills | Status: DC
  Filled 2023-04-30: qty 15, 30d supply, fill #0

## 2023-04-30 MED ORDER — DAPAGLIFLOZIN PROPANEDIOL 10 MG PO TABS
10.0000 mg | ORAL_TABLET | Freq: Every day | ORAL | 1 refills | Status: DC
  Filled 2023-04-30: qty 30, 30d supply, fill #0

## 2023-04-30 MED ORDER — OSELTAMIVIR PHOSPHATE 30 MG PO CAPS
30.0000 mg | ORAL_CAPSULE | Freq: Two times a day (BID) | ORAL | 0 refills | Status: AC
  Filled 2023-04-30: qty 3, 2d supply, fill #0

## 2023-04-30 MED ORDER — FUROSEMIDE 40 MG PO TABS
40.0000 mg | ORAL_TABLET | Freq: Every day | ORAL | 0 refills | Status: DC | PRN
  Filled 2023-04-30: qty 30, 30d supply, fill #0

## 2023-04-30 MED ORDER — LOSARTAN POTASSIUM 25 MG PO TABS: 12.5000 mg | ORAL_TABLET | Freq: Every day | ORAL | Status: DC

## 2023-04-30 NOTE — Progress Notes (Signed)
 Advanced Heart Failure Rounding Note  Cardiologist: Armida Lander, MD  Chief Complaint:  Subjective:    1/13: RHC with low filling pressures>>adequately diuresed. Cardiac index 1.9 by thermodilution and 2.4 by Fick. Diuretics held.   Feels much better today. Breathing much improved. AKI resolving. SCr down to 1.37 today. SPBs 120s - 130s    RHC 1/13 RA:                  4 mmHg (mean) RV:                  31/2-4 mmHg PA:                  28/14 mmHg (21 mean) PCWP:            14 mmHg (mean)                                      Estimated Fick CO/CI   5.7 L/min, 2.4 L/min/m2 Thermodilution CO/CI   4.5 L/min, 1.9 L/min/m2                                              TPG                 7  mmHg                                              PVR                 1.2-1.6 Wood Units  PAPi                3.5        Objective:   Weight Range: 107.2 kg Body mass index is 30.35 kg/m.   Vital Signs:   Temp:  [97.7 F (36.5 C)-98.1 F (36.7 C)] 98 F (36.7 C) (01/15 0811) Pulse Rate:  [82-90] 87 (01/15 0811) Resp:  [16-26] 16 (01/15 0811) BP: (98-123)/(77-91) 120/90 (01/15 0811) SpO2:  [94 %-99 %] 96 % (01/15 0811) Weight:  [107.2 kg] 107.2 kg (01/15 0423) Last BM Date : 04/28/23  Weight change: Filed Weights   04/28/23 0545 04/29/23 0622 04/30/23 0423  Weight: 107 kg 106.9 kg 107.2 kg    Intake/Output:   Intake/Output Summary (Last 24 hours) at 04/30/2023 0930 Last data filed at 04/30/2023 0400 Gross per 24 hour  Intake 240 ml  Output 700 ml  Net -460 ml      Physical Exam   General:  Well appearing. No respiratory difficulty HEENT: normal Neck: supple. no JVD. Carotids 2+ bilat; no bruits. No lymphadenopathy or thyromegaly appreciated. Cor: PMI nondisplaced. Regular rate & rhythm. No rubs, gallops or murmurs. Lungs: clear Abdomen: soft, nontender, nondistended. No hepatosplenomegaly. No bruits or masses. Good bowel sounds. Extremities: no cyanosis,  clubbing, rash, edema Neuro: alert & oriented x 3, cranial nerves grossly intact. moves all 4 extremities w/o difficulty. Affect pleasant.  Telemetry   NSR  80s  EKG    N/A   Labs    CBC Recent Labs    04/29/23 0450 04/30/23 0420  WBC 7.2 5.6  HGB  15.7 14.8  HCT 46.5 44.6  MCV 87.2 87.3  PLT 130* 137*   Basic Metabolic Panel Recent Labs    40/98/11 0450 04/30/23 0420  NA 137 139  K 4.0 3.7  CL 103 105  CO2 25 25  GLUCOSE 123* 131*  BUN 37* 21*  CREATININE 1.83* 1.37*  CALCIUM 8.0* 8.1*  MG 2.5* 2.5*   Liver Function Tests Recent Labs    04/29/23 0450 04/30/23 0420  AST 54* 36  ALT 120* 86*  ALKPHOS 38 38  BILITOT 0.5 0.8  PROT 6.4* 6.0*  ALBUMIN 2.7* 2.7*   No results for input(s): "LIPASE", "AMYLASE" in the last 72 hours. Cardiac Enzymes No results for input(s): "CKTOTAL", "CKMB", "CKMBINDEX", "TROPONINI" in the last 72 hours.  BNP: BNP (last 3 results) Recent Labs    04/26/23 1023  BNP 584.7*    ProBNP (last 3 results) No results for input(s): "PROBNP" in the last 8760 hours.   D-Dimer No results for input(s): "DDIMER" in the last 72 hours. Hemoglobin A1C Recent Labs    04/27/23 1017  HGBA1C 6.5*   Fasting Lipid Panel No results for input(s): "CHOL", "HDL", "LDLCALC", "TRIG", "CHOLHDL", "LDLDIRECT" in the last 72 hours. Thyroid Function Tests No results for input(s): "TSH", "T4TOTAL", "T3FREE", "THYROIDAB" in the last 72 hours.  Invalid input(s): "FREET3"  Other results:   Imaging    No results found.    Medications:     Scheduled Medications:  Chlorhexidine  Gluconate Cloth  6 each Topical Daily   dapagliflozin  propanediol  10 mg Oral Daily   dextromethorphan   60 mg Oral BID   enoxaparin  (LOVENOX ) injection  40 mg Subcutaneous Q24H   metoprolol  succinate  12.5 mg Oral Daily   oseltamivir   30 mg Oral BID   sodium chloride  flush  10-40 mL Intracatheter Q12H    Infusions:   PRN  Medications: ipratropium-albuterol , mouth rinse, sodium chloride  flush    Patient Profile   53 y/o AAM w/ h/o COVID 19 infection 03/2020, HTN, chronic renal insuffiency and chronic systolic HF due to NICM and h/o poor compliance and follow-up, admitted w/ acute hypoxic respiratory failure in the setting of a/c CHF and Influenza A.   Assessment/Plan   1. Acute on Chronic Systolic Heart Failure  - Echo 4/22 EF 20-25%, global HK, RV mildly reduced  - Cath 07/19/20 EF 15% normal cors. Preserved output. LVEDP 22 - cMRI 07/21/20  EF < 25%, RV moderately reduced.  - Echo 03/15/21: EF 40-45% - Admitted w/ NYHA Class III symptoms and pulm edema on CXR  - Echo this admit EF 30-35%, RV mildly reduced  - well diuresed. RHC 1/13 w/ RA 4, PAP 28/14 mmHg, PCW 14, TD  CO/CI 4.5 L/min, 1.9 L/min/m2, FICK 5.7 L/min, 2.4 L/min/m2  - AKI improving, SCr 1.37 today  - continue Farxiga  10 mg daily  - continue toprol  XL 12.5 mg daily - start losartan  12.5 mg at bedtime  - Lasix  PRN only  - still start MRA at outpatient f/u    2. Influenza A - supportive care - on Tamiflu     3. 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   Cardiac Meds for Discharge Farxiga  10 mg  Losartan  12.5 mg at bedtime Toprol  XL 12.5 mg daily   Post hospital f/u  arranged w/ Dr. Bensimhon. Will place appt info in AVS    Length of Stay: 3  Arieana Somoza Elissa Guise  04/30/2023, 9:30 AM  Advanced Heart Failure Team Pager [phone] (M-F; 7a - 5p)  Please contact [facility] for night-coverage after hours (5p -7a ) and weekends on [email or web address]

## 2023-04-30 NOTE — Progress Notes (Signed)
 CARDIAC REHAB PHASE I   HF education including HF booklet, heart healthy diabetic low sodium diet, exercise guidelines, restrictions, risk factors and CRP2 reviewed. All questions and concerns addressed. Will refer to [facility] for CRP2.   1120-1150  Ronny Colas, RN BSN 04/30/2023 12:03 PM

## 2023-04-30 NOTE — Discharge Summary (Addendum)
 Family Medicine Teaching [facility] Discharge Summary  Patient name: Tony Meyer Medical record number: [id number] Date of birth: 13-Jun-1970 Age: 53 y.o. Gender: male Date of Admission: 04/26/2023  Date of Discharge: 04/30/23  Admitting Physician: Sarahann Cumins, DO  Primary Care Provider: Patient, No Pcp Per Consultants: Cardiology  Indication for Hospitalization: Acute on Chronic CHF  Discharge Diagnoses/Problem List:  Principal Problem for Admission: Acute on Chronic CHF Other Problems addressed during stay:  Principal Problem:   Acute on chronic systolic heart failure (HCC) Active Problems:   Influenza A with respiratory manifestations   AKI on CKD3a   Elevated LFTs   Elevated troponin   Nonischemic cardiomyopathy (HCC)   Type 2 diabetes mellitus with hyperglycemia [facility])   Bronchopneumonia  Brief Hospital Course:  Tony Meyer is a 53 y.o.male with a history of HFrEF, HTN, and T2DM who was admitted to the [facility] Medicine teaching Service at [facility] for shortness of breath 2/2 acute CHF in setting of influenza. His hospital course is detailed below:  Acute on Chronic CHF Exacerbation Presented with increased shortness of breath with exertion and at rest, orthopnea, and distended abdomen.  BNP elevated to 584.7, troponin 97>109, then trended flat.  CXR demonstrated patchy perihilar and bibasilar airspace opacities (pulmonary edema vs multifocal PNA).  Diuresed with IV Lasix  60 mg twice daily as tolerated.  Treated with DuoNebs every 4 hours as needed.  Held Coreg , spironolactone , and Entresto , but continued Farxiga  in the setting of soft blood pressures and AKI.  Cardiology was consulted and recommended diuresis, holding off on GDMT due to renal function and soft BPs. Echo demonstrated LVEF 30-35%, LV global hypokinesis, mild concentric LVH, G2DD, and mild-moderate MVR. R heart cath demonstrated moderate-severely reduced cardiac index. Cardiology advised starting  Losartan  12.5 mg nightly, Lasix  as needed, and restart spironolactone  outpatient due to hypovolemia.   AKI Creatinine 2.04 upon admission, likely 2/2 third spacing and continuing diuresis.  RUQ US  performed due to distended abdomen and low back pain, this revealed simple cysts within the right kidney.  Influenza A Influenza positive on RVP.  Tamiflu  30 mg twice daily (1/12-1/17).  Other chronic conditions were medically managed with home medications and formulary alternatives as necessary: T2DM: A1c 6.5.  Monitored with CBGs  PCP Follow-up Recommendations: If Respiratory status improves and then acutely worsens, recommend starting antibiotics for bacterial pneumonia (recent Influenza infection).  Restart GDMT (MRA) as appropriate and as BP tolerates  Disposition: Home  Discharge Condition: Stable  Discharge Exam:  Vitals:   04/30/23 0415 04/30/23 0811  BP: 118/84 (!) 120/90  Pulse: 90 87  Resp: (!) 21 16  Temp: 98.1 F (36.7 C) 98 F (36.7 C)  SpO2: 94% 96%   General: Sitting up in bed in NAD Cardiovascular: RRR. No M/R/G. Respiratory: Bibasilar crackles and diffuse end expiratory wheezing.  Normal WOB on RA. No diminished breath sounds. Abdomen: Soft, non-tender, distended. Normoactive bowel sounds. Extremities: Moving all 4 extremities, no BLE edema.  Significant Procedures:   Significant Labs and Imaging:  Recent Labs  Lab 04/29/23 0450 04/30/23 0420  WBC 7.2 5.6  HGB 15.7 14.8  HCT 46.5 44.6  PLT 130* 137*   Recent Labs  Lab 04/29/23 0450 04/30/23 0420  NA 137 139  K 4.0 3.7  CL 103 105  CO2 25 25  GLUCOSE 123* 131*  BUN 37* 21*  CREATININE 1.83* 1.37*  CALCIUM 8.0* 8.1*  MG 2.5* 2.5*  ALKPHOS 38 38  AST 54* 36  ALT  120* 86*  ALBUMIN 2.7* 2.7*   CXR: Patchy perihilar and bibasilar airspace opacities, which may represent pulmonary edema or multifocal pneumonia.  RUQ US : 1. Incidental finding of simple cysts within the right kidney. 2. Otherwise  unremarkable right upper quadrant ultrasound.  Repeat CXR: 1. Improving heterogeneous bilateral airspace opacities, mild residual in the left mid lung and right infrahilar region. 2. Similar cardiomegaly.  Results/Tests Pending at Time of Discharge: none  Discharge Medications:  Allergies as of 04/30/2023   No Known Allergies      Medication List     STOP taking these medications    carvedilol  3.125 MG tablet Commonly known as: COREG    Entresto  49-51 MG Generic drug: sacubitril -valsartan    potassium chloride  SA 20 MEQ tablet Commonly known as: KLOR-CON  M   spironolactone  25 MG tablet Commonly known as: ALDACTONE        TAKE these medications    Farxiga  10 MG Tabs tablet Generic drug: dapagliflozin  propanediol Take 1 tablet (10 mg total) by mouth daily. What changed: See the new instructions.   furosemide  40 MG tablet Commonly known as: LASIX  Take 1 tablet (40 mg total) by mouth daily as needed for fluid or edema (IF 3 POUNDS WEIGHT GAIN IN A DAY). What changed: See the new instructions.   losartan  25 MG tablet Commonly known as: COZAAR  Take 0.5 tablets (12.5 mg total) by mouth at bedtime.   metoprolol  succinate 25 MG 24 hr tablet Commonly known as: TOPROL -XL Take 0.5 tablets (12.5 mg total) by mouth daily.   oseltamivir  30 MG capsule Commonly known as: TAMIFLU  Take 1 capsule (30 mg total) by mouth 2 (two) times daily for 3 doses.       Discharge Instructions: Please refer to Patient Instructions section of EMR for full details.  Patient was counseled important signs and symptoms that should prompt return to medical care, changes in medications, dietary instructions, activity restrictions, and follow up appointments.   Follow-Up Appointments:  Follow-up Information     Bensimhon, Rheta Celestine, MD Follow up.   Specialty: Cardiology Why: 05/19/2022 at 10:40 AM  Hospital Follow-up in the Advanced Heart Failure Clinic at [facility], Velma Ghazi information: [address] [place] [address] [phone]         [facility] Of [facility] Follow up on 05/23/2023.   Specialty: Internal Medicine Why: 1:40 for hospital follow up Contact information: [address] [place]  [address] [phone] Additional information: [address] [place], [place] [address]               Clyda Dark, DO 04/30/2023, 1:18 PM PGY-1, [facility]  Upper Level Addendum:  I have seen and evaluated this patient along with Dr. Randeen Busman and reviewed the above note, making necessary revisions as appropriate.  I agree with the medical decision making and physical exam as noted above.  Genora Kidd, MD PGY-3 [facility] Family Medicine Residency

## 2023-04-30 NOTE — TOC Transition Note (Addendum)
 Transition of Care [facility]) - Discharge Note   Patient Details  Name: Tony Meyer MRN: [id number] Date of Birth: 22-Sep-1970  [facility] CM/SW Contact:  Jennett Model, RN Phone Number: 04/30/2023, 10:47 AM   Clinical Narrative:    For dc today, has hospital follow up on AVS with [facility]. Per pt eval rec OP Cardiac Rehab, NCM notified PA [place], will need consult for cardiac rehab to see patient.     Barriers to Discharge: Continued Medical Work up   Patient Goals and CMS Choice            Discharge Placement                       Discharge Plan and Services Additional resources added to the After Visit Summary for                                       Social Drivers of Health (SDOH) Interventions SDOH Screenings   Food Insecurity: No Food Insecurity (04/26/2023)  Housing: Low Risk  (04/26/2023)  Transportation Needs: No Transportation Needs (04/26/2023)  Utilities: Not At Risk (04/26/2023)  Financial Resource Strain: Medium Risk (07/19/2020)  Tobacco Use: Low Risk  (04/26/2023)     Readmission Risk Interventions     No data to display

## 2023-04-30 NOTE — Discharge Instructions (Signed)
 Dear Tony Meyer,  Thank you for letting us  participate in your care. You were hospitalized for shortness of breath and diagnosed with Acute on chronic systolic heart failure (HCC) and Influenza. POST-HOSPITAL & CARE INSTRUCTIONS Please see medication section of this packet for updated medications We recommend establishing care with a primary care doctor Go to your follow up appointments (listed below)   DOCTOR'S APPOINTMENT   Future Appointments  Date Time Provider Department Center  05/20/2023 10:40 AM Bensimhon, Rheta Celestine, MD [facility] None     Take care and be well!  Family Medicine Teaching Service Inpatient Team [place]  [facility]  [address] [place], [place] [address] [phone]

## 2023-04-30 NOTE — Progress Notes (Signed)
 PICC Removal Note: PICC line removed from RUE. PICC catheter tip visualized and intact. Pressure held until hemostasis achieved. Pressure dressing applied. No redness, ecchymosis, edema, swelling, or drainage noted at site. Instructions provided on post PICC discharge care, including followup notification instructions.  Bedrest until 1230.

## 2023-05-13 ENCOUNTER — Telehealth (HOSPITAL_COMMUNITY): Payer: Self-pay

## 2023-05-13 NOTE — Telephone Encounter (Signed)
Called and spoke with pt in regards to CR, pt stated he is not able to participate at this time due to his work schedule.  Closed referral

## 2023-05-19 NOTE — Progress Notes (Signed)
 ADVANCED HEART FAILURE CLINIC NOTE  PCP: None  Primary HF Cardiologist: Dr. Cherrie   CC: Heart Failure   HPI: Tony Meyer is a 53 y/o AAM w/ h/o COVID 19 infection 03/2020, HTN, chronic renal insuffiency and new diagnosis of systolic HF.  Presented to [facility] on 07/13/20 with increased dyapnea and edema. Dyspnea started shortly after being diagnosed w/ COVID. ECHO showed severely reduced EF 20-25% and RV mildly reduced. Started on IV lasix . Transferred to [facility] on 07/18/20. R/LHC showed normal coronaries, severe NICM EF 15% w/ volume overload and preserved CO. GDMT further titrated. cMRI LVEF < 25% and moderately reduced RV -->c/w NICM. Discharged 07/20/20.   At his follow up 4/22, he did not start his metoprolol  due to cost. AHF follow up 6/22 he had been out of meds for a week due to cost.  Echo 03/15/21 EF 40-45%   Never f/u'd in HF Clinic.   Admitted 1/25 with ADHF in setting of Flu. EF 30-35% Underwent RHC RA 4 PA 28/14 (21) PCWP 14 Fick 5.7/2.4 TD 4.5/1.9  Here for post-hospital f/u. 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 Can walk on flat ground with no problem. If has to go up hill or push mail gets SOB. Edema under good control. BP goes low some time after meds. Feels dizzy. Snores heavily   Cardiac Studies   - Echo (4/22): EF 20-25% w/ global HK, mild LVH, RV moderately enlarged and systolic function mildly reduced. No significant valvular disease. Only mild Tony and mild TR. RVSP normal at 23 mmHg.   R/LHC 07/19/20 Ao = 103/79 (89) LV = 102/22 RA =  11 RV = 50/12 PA = 42/17 (26) PCW = 14 Fick cardiac output/index = 7.9/3.4 PVR = 1.5 WU SVR = 791 Ao sat = 98% PA sat = 75%, 76% SVC sat - 75%   Assessment:  1. Normal coronary arteries 2. Severe NICM EF 15% 3. Volume overload with preserved CO   cMRI 07/20/20 1. Moderately dilated LV with mild LVH, EF 23%.  2. Moderately dilated RV with EF 21%.  3. Nonspecific inferior RV insertion site LGE suggestive  of pressure/volume overload.  4. Extracellular volume percentage not significantly elevated and T2 signal not high.  ROS: All systems negative except as listed in HPI, PMH and Problem List.  SH:  Social History   Socioeconomic History   Marital status: Single    Spouse name: Not on file   Number of children: Not on file   Years of education: Not on file   Highest education level: Not on file  Occupational History   Not on file  Tobacco Use   Smoking status: Never   Smokeless tobacco: Never  Vaping Use   Vaping status: Never Used  Substance and Sexual Activity   Alcohol use: Never   Drug use: Never   Sexual activity: Not on file  Other Topics Concern   Not on file  Social History Narrative   Not on file   Social Drivers of Health   Financial Resource Strain: Medium Risk (07/19/2020)   Overall Financial Resource Strain (CARDIA)    Difficulty of Paying Living Expenses: Somewhat hard  Food Insecurity: No Food Insecurity (04/26/2023)   Hunger Vital Sign    Worried About Running Out of Food in the Last Year: Never true    Ran Out of Food in the Last Year: Never true  Transportation Needs: No Transportation  Needs (04/26/2023)   PRAPARE - Administrator, Civil Service (Medical): No    Lack of Transportation (Non-Medical): No  Physical Activity: Not on file  Stress: Not on file  Social Connections: Not on file  Intimate Partner Violence: Not At Risk (04/26/2023)   Humiliation, Afraid, Rape, and Kick questionnaire    Fear of Current or Ex-Partner: No    Emotionally Abused: No    Physically Abused: No    Sexually Abused: No   FH:  Family History  Problem Relation Age of Onset   Heart failure Neg Hx    Past Medical History:  Diagnosis Date   Acute systolic heart failure (HCC) 07/18/2020   AKI (acute kidney injury) (HCC) 03/15/2020   Chronic kidney disease    Chronic systolic heart failure (HCC) 11/09/2020   Elevated hemoglobin A1c measurement 04/26/2023    6.7% in 2022     Essential hypertension 03/15/2020   Nonischemic cardiomyopathy (HCC) 04/26/2023   Pneumonia due to COVID-19 virus 03/14/2020   Current Outpatient Medications  Medication Sig Dispense Refill   carvedilol  (COREG ) 3.125 MG tablet Take 3.125 mg by mouth 2 (two) times daily with a meal.     dapagliflozin  propanediol (FARXIGA ) 10 MG TABS tablet Take 1 tablet (10 mg total) by mouth daily. 30 tablet 1   furosemide  (LASIX ) 40 MG tablet Take 1 tablet (40 mg total) by mouth daily as needed for fluid or edema (IF 3 POUNDS WEIGHT GAIN IN A DAY). 30 tablet 0   losartan  (COZAAR ) 25 MG tablet Take 0.5 tablets (12.5 mg total) by mouth at bedtime. 15 tablet 0   potassium chloride  SA (KLOR-CON  M) 20 MEQ tablet Take 20 mEq by mouth as needed.     spironolactone  (ALDACTONE ) 25 MG tablet Take 25 mg by mouth daily.     metoprolol  succinate (TOPROL -XL) 25 MG 24 hr tablet Take 0.5 tablets (12.5 mg total) by mouth daily. 15 tablet 1   No current facility-administered medications for this encounter.   Blood pressure (!) 140/90, pulse 95, weight 110.3 kg (243 lb 3.2 oz), SpO2 95%.   Wt Readings from Last 3 Encounters:  05/20/23 110.3 kg (243 lb 3.2 oz)  04/30/23 107.2 kg (236 lb 6.4 oz)  12/13/20 103.6 kg (228 lb 6.4 oz)   PHYSICAL EXAM: General:  Well appearing. No resp difficulty HEENT: normal Neck: supple. no JVD. Carotids 2+ bilat; no bruits. No lymphadenopathy or thryomegaly appreciated. Cor: PMI nondisplaced. Regular rate & rhythm. No rubs, gallops or murmurs. Lungs: clear Abdomen: obese soft, nontender, nondistended. No hepatosplenomegaly. No bruits or masses. Good bowel sounds. Extremities: no cyanosis, clubbing, rash, edema Neuro: alert & orientedx3, cranial nerves grossly intact. moves all 4 extremities w/o difficulty. Affect pleasant   ECG: NSR 87 LVH QRS 96ms Personally reviewed  ASSESSMENT & PLAN:  1. Chronic Systolic Heart Failure  - Echo 4/22 EF 20-25%, global HK, RV  mildly reduced  - Cath 07/19/20 EF 15% normal cors. Preserved output. LVEDP 22 - cMRI 07/21/20  EF < 25%, RV moderately reduced.  - Echo 03/15/21: EF 40-45% -> lost to f/u. Off meds.  - Admitted 1/25 with ADHF - Echo 1/25 EF 30-35%, RV mildly reduced  - RHC 1/25 RA 4, PAP 28/14 mmHg, PCW 14, TD  CO/CI 4.5 L/min, 1.9 L/min/m2, FICK 5.7 L/min, 2.4 L/min/m2  - NYHA II - Volume status ok has only taken lasix  once since d/c - Increase losartan  to 25 daily  - Increase Toprol  to 25  daily  - Continue Farxiga  10  - Continue spiro 25  - Stop carvedilol   - Repeat ech in 3 months to assess for LV improvement   2. 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 Morbid obesity  - Body mass index is 31.23 kg/m. - Consider GLP1RA  4. HTN  - BP mildly elevated here but says he has been lightheaded at home - May need 24 ABPM  5. Snoring - check home sleep study   Toribio Fuel, MD  11:33 AM  05/20/23 11:33 AM

## 2023-05-20 ENCOUNTER — Encounter (HOSPITAL_COMMUNITY): Payer: Self-pay | Admitting: Internal Medicine

## 2023-05-20 ENCOUNTER — Ambulatory Visit (HOSPITAL_COMMUNITY)
Admission: RE | Admit: 2023-05-20 | Discharge: 2023-05-20 | Disposition: A | Discharge status: Home / Self Care | Payer: 59 | Source: Ambulatory Visit | Attending: Internal Medicine | Admitting: Internal Medicine

## 2023-05-20 VITALS — BP 140/90 | HR 95 | Wt 243.2 lb

## 2023-05-20 DIAGNOSIS — R0683 Snoring: Secondary | ICD-10-CM | POA: Diagnosis not present

## 2023-05-20 DIAGNOSIS — I5022 Chronic systolic (congestive) heart failure: Secondary | ICD-10-CM

## 2023-05-20 DIAGNOSIS — I130 Hypertensive heart and chronic kidney disease with heart failure and stage 1 through stage 4 chronic kidney disease, or unspecified chronic kidney disease: Secondary | ICD-10-CM | POA: Insufficient documentation

## 2023-05-20 DIAGNOSIS — Z79899 Other long term (current) drug therapy: Secondary | ICD-10-CM | POA: Insufficient documentation

## 2023-05-20 DIAGNOSIS — Z6831 Body mass index (BMI) 31.0-31.9, adult: Secondary | ICD-10-CM | POA: Insufficient documentation

## 2023-05-20 DIAGNOSIS — I428 Other cardiomyopathies: Secondary | ICD-10-CM | POA: Diagnosis not present

## 2023-05-20 DIAGNOSIS — N1831 Chronic kidney disease, stage 3a: Secondary | ICD-10-CM | POA: Insufficient documentation

## 2023-05-20 DIAGNOSIS — R42 Dizziness and giddiness: Secondary | ICD-10-CM | POA: Insufficient documentation

## 2023-05-20 DIAGNOSIS — N183 Chronic kidney disease, stage 3 unspecified: Secondary | ICD-10-CM

## 2023-05-20 DIAGNOSIS — I10 Essential (primary) hypertension: Secondary | ICD-10-CM | POA: Diagnosis not present

## 2023-05-20 LAB — BASIC METABOLIC PANEL
Anion gap: 10 (ref 5–15)
BUN: 12 mg/dL (ref 6–20)
CO2: 26 mmol/L (ref 22–32)
Calcium: 9.8 mg/dL (ref 8.9–10.3)
Chloride: 105 mmol/L (ref 98–111)
Creatinine, Ser: 1.46 mg/dL — ABNORMAL HIGH (ref 0.61–1.24)
GFR, Estimated: 57 mL/min — ABNORMAL LOW (ref >60)
Glucose, Bld: 117 mg/dL — ABNORMAL HIGH (ref 70–99)
Potassium: 4.8 mmol/L (ref 3.5–5.1)
Sodium: 141 mmol/L (ref 135–145)

## 2023-05-20 LAB — BRAIN NATRIURETIC PEPTIDE: B Natriuretic Peptide: 326.3 pg/mL — ABNORMAL HIGH (ref 0.0–100.0)

## 2023-05-20 MED ORDER — DAPAGLIFLOZIN PROPANEDIOL 10 MG PO TABS: 10.0000 mg | ORAL_TABLET | Freq: Every day | ORAL | 1 refills | Status: AC

## 2023-05-20 MED ORDER — LOSARTAN POTASSIUM 25 MG PO TABS: 25.0000 mg | ORAL_TABLET | Freq: Every day | ORAL | 6 refills | Status: DC

## 2023-05-20 MED ORDER — SPIRONOLACTONE 25 MG PO TABS: 25.0000 mg | ORAL_TABLET | Freq: Every day | ORAL | 6 refills | Status: DC

## 2023-05-20 MED ORDER — METOPROLOL SUCCINATE ER 25 MG PO TB24: 25.0000 mg | ORAL_TABLET | Freq: Every evening | ORAL | 6 refills | Status: DC

## 2023-05-20 NOTE — Patient Instructions (Signed)
 Great to see you today!!!  Medication Changes:  STOP Carvedilol   INCREASE Losartan  to 25 mg (1 tab) Daily at bedtime  INCREASE Toprol  XL to 25 mg (1 tab) Daily at bedtime  Lab Work:  Labs done today, your results will be available in MyChart, we will contact you for abnormal readings.   Testing/Procedures:  Your provider has recommended that you have a home sleep study (Itamar Test).  We have provided you with the equipment in our office today. Please go ahead and download the app. DO NOT OPEN OR TAMPER WITH THE BOX UNTIL WE ADVISE YOU TO DO SO. Once insurance has approved the test our office will call you with PIN number and approval to proceed with testing. Once you have completed the test you just dispose of the equipment, the information is automatically uploaded to us  via blue-tooth technology. If your test is positive for sleep apnea and you need a home CPAP machine you will be contacted by Dr Dorine office [facility]) to set this up.   Special Instructions // Education:  Do the following things EVERYDAY: Weigh yourself in the morning before breakfast. Write it down and keep it in a log. Take your medicines as prescribed Eat low salt foods--Limit salt (sodium) to 2000 mg per day.  Stay as active as you can everyday Limit all fluids for the day to less than 2 liters   Follow-Up in: 3-4 months   At the [facility] Clinic, you and your health needs are our priority. We have a designated team specialized in the treatment of Heart Failure. This Care Team includes your primary Heart Failure Specialized Cardiologist (physician), Advanced Practice Providers (APPs- Physician Assistants and Nurse Practitioners), and Pharmacist who all work together to provide you with the care you need, when you need it.   You may see any of the following providers on your designated Care Team at your next follow up:  Dr. Toribio Fuel Dr. Ezra Shuck Dr. Ria Commander Dr. Odis Brownie Greig Mosses, NP Caffie Shed, [place] [facility] [place], [place] Beckey Coe, NP Jordan Lee, NP Tinnie Redman, PharmD   Please be sure to bring in all your medications bottles to every appointment.   Need to Contact Us :  If you have any questions or concerns before your next appointment please send us  a message through [place] or call our office at [phone].    TO LEAVE A MESSAGE FOR THE NURSE SELECT OPTION 2, PLEASE LEAVE A MESSAGE INCLUDING: YOUR NAME DATE OF BIRTH CALL BACK NUMBER REASON FOR CALL**this is important as we prioritize the call backs  YOU WILL RECEIVE A CALL BACK THE SAME DAY AS LONG AS YOU CALL BEFORE 4:00 PM

## 2023-05-20 NOTE — Progress Notes (Signed)
 Height: 6'2    Weight: 243 lbs BMI: 31.23  Today's Date: 05/20/23  STOP BANG RISK ASSESSMENT S (snore) Have you been told that you snore?     YES   T (tired) Are you often tired, fatigued, or sleepy during the day?   YES  O (obstruction) Do you stop breathing, choke, or gasp during sleep? NO   P (pressure) Do you have or are you being treated for high blood pressure? YES   B (BMI) Is your body index greater than 35 kg/m? NO   A (age) Are you 55 years old or older? YES   N (neck) Do you have a neck circumference greater than 16 inches?      G (gender) Are you a male? YES   TOTAL STOP/BANG "YES" ANSWERS 5                                                                       For Office Use Only              Procedure Order Form    YES to 3+ Stop Bang questions OR two clinical symptoms - patient qualifies for WatchPAT (CPT 95800)      Clinical Notes: Will consult Sleep Specialist and refer for management of therapy due to patient increased risk of Sleep Apnea. Ordering a sleep study due to the following two clinical symptoms: Excessive daytime sleepiness G47.10 /  Loud snoring R06.83

## 2023-05-23 ENCOUNTER — Ambulatory Visit: Payer: Self-pay | Admitting: Nurse Practitioner

## 2023-06-03 ENCOUNTER — Telehealth (HOSPITAL_COMMUNITY): Payer: Self-pay

## 2023-06-03 NOTE — Telephone Encounter (Signed)
 Patient had been given Itmar home study at last visit,insurance wants in patient study. Patient called and can not do overnight test due to his job.Dr. Gala Romney made aware.

## 2023-08-15 ENCOUNTER — Telehealth (HOSPITAL_COMMUNITY): Payer: Self-pay

## 2023-08-15 NOTE — Telephone Encounter (Signed)
 Called to confirm/remind patient of their appointment at the Advanced [facility] Failure Clinic on 08/18/23.   Appointment:   [x] Confirmed  [] Left mess   [] No answer/No voice mail  [] VM Full/unable to leave message  [] Phone not in service  Patient reminded to bring all medications and/or complete list.  Confirmed patient has transportation. Gave directions, instructed to utilize valet parking.

## 2023-08-15 NOTE — Progress Notes (Signed)
 Advanced Heart Failure Clinic Note    PCP: None  HF Cardiologist: Dr. Julane Ny   HPI: Tony Meyer is a 53 y.o. AAM w/ h/o COVID 19 infection 03/2020, HTN, chronic renal insuffiency and new diagnosis of systolic HF.  Presented to [facility] on 07/13/20 with increased dyapnea and edema. Dyspnea started shortly after being diagnosed w/ COVID. ECHO showed severely reduced EF 20-25% and RV mildly reduced. Started on IV lasix . Transferred to [facility] on 07/18/20. R/LHC showed normal coronaries, severe NICM EF 15% w/ volume overload and preserved CO. GDMT further titrated. cMRI LVEF < 25% and moderately reduced RV -->c/w NICM.   At his follow up 4/22, he did not start his metoprolol  due to cost. AHF follow up 6/22 he had been out of meds for a week due to cost.  Echo 03/15/21 EF 40-45%.   Never f/u'd in HF Clinic.   Admitted 1/25 with ADHF in setting of Flu. EF 30-35%. Underwent RHC RA 4 PA 28/14 (21) PCWP 14 Fick 5.7/2.4 TD 4.5/1.9.  Today he returns for HF follow up. Overall feeling fine. No longer working at [employer], currently unemployed. He has SOB walking shorter distances on flat ground. Occasional palpitations when walking too much. He has positional dizziness at home after taking his medications. Has abdomen fullness. Denies abnormal bleeding, CP, or PND/Orthopnea. Appetite fair, has early satiety. Taking all medications. Snores heavily. Not working currently. No ETOH, tobacco or drug use. Took Lasix  yesterday.  Cardiac Studies  - RHC 1/25: RA 4, PA 28/14 (21), PCWP 14, Fick 5.7/2.4, TD 4.5/1.9  - Echo 1/25: EF 30-35%  - Echo 12/22: EF 40-45%  - Echo (4/22): EF 20-25% w/ global HK, mild LVH, RV moderately enlarged and systolic function mildly reduced. No significant valvular disease. Only mild Tony and mild TR. RVSP normal at 23 mmHg.   - R/LHC 07/19/20 Ao = 103/79 (89) LV = 102/22 RA =  11 RV = 50/12 PA = 42/17 (26) PCW = 14 Fick cardiac output/index = 7.9/3.4 PVR = 1.5 WU SVR = 791 Ao sat  = 98% PA sat = 75%, 76% SVC sat - 75%   Assessment:  1. Normal coronary arteries 2. Severe NICM EF 15% 3. Volume overload with preserved CO   - cMRI 07/20/20 1. Moderately dilated LV with mild LVH, EF 23%.  2. Moderately dilated RV with EF 21%.  3. Nonspecific inferior RV insertion site LGE suggestive of pressure/volume overload.  4. Extracellular volume percentage not significantly elevated and T2 signal not high.  ROS: All systems negative except as listed in HPI, PMH and Problem List.  SH:  Social History   Socioeconomic History   Marital status: Single    Spouse name: Not on file   Number of children: Not on file   Years of education: Not on file   Highest education level: Not on file  Occupational History   Not on file  Tobacco Use   Smoking status: Never   Smokeless tobacco: Never  Vaping Use   Vaping status: Never Used  Substance and Sexual Activity   Alcohol use: Never   Drug use: Never   Sexual activity: Not on file  Other Topics Concern   Not on file  Social History Narrative   Not on file   Social Drivers of Health   Financial Resource Strain: Medium Risk (07/19/2020)   Overall Financial Resource Strain (CARDIA)    Difficulty of Paying Living Expenses: Somewhat hard  Food Insecurity: No Food Insecurity (  04/26/2023)   Hunger Vital Sign    Worried About Running Out of Food in the Last Year: Never true    Ran Out of Food in the Last Year: Never true  Transportation Needs: No Transportation Needs (04/26/2023)   PRAPARE - Administrator, Civil Service (Medical): No    Lack of Transportation (Non-Medical): No  Physical Activity: Not on file  Stress: Not on file  Social Connections: Not on file  Intimate Partner Violence: Not At Risk (04/26/2023)   Humiliation, Afraid, Rape, and Kick questionnaire    Fear of Current or Ex-Partner: No    Emotionally Abused: No    Physically Abused: No    Sexually Abused: No   FH:  Family History  Problem  Relation Age of Onset   Heart failure Neg Hx    Past Medical History:  Diagnosis Date   Acute systolic heart failure (HCC) 07/18/2020   AKI (acute kidney injury) (HCC) 03/15/2020   Chronic kidney disease    Chronic systolic heart failure (HCC) 11/09/2020   Elevated hemoglobin A1c measurement 04/26/2023   6.7% in 2022     Essential hypertension 03/15/2020   Nonischemic cardiomyopathy (HCC) 04/26/2023   Pneumonia due to COVID-19 virus 03/14/2020   Current Outpatient Medications  Medication Sig Dispense Refill   dapagliflozin  propanediol (FARXIGA ) 10 MG TABS tablet Take 1 tablet (10 mg total) by mouth daily. 30 tablet 1   furosemide  (LASIX ) 40 MG tablet Take 1 tablet (40 mg total) by mouth daily as needed for fluid or edema (IF 3 POUNDS WEIGHT GAIN IN A DAY). 30 tablet 0   losartan  (COZAAR ) 25 MG tablet Take 1 tablet (25 mg total) by mouth at bedtime. 30 tablet 6   metoprolol  succinate (TOPROL -XL) 25 MG 24 hr tablet Take 1 tablet (25 mg total) by mouth at bedtime. 30 tablet 6   potassium chloride  SA (KLOR-CON  M) 20 MEQ tablet Take 20 mEq by mouth as needed.     spironolactone  (ALDACTONE ) 25 MG tablet Take 1 tablet (25 mg total) by mouth daily. 30 tablet 6   No current facility-administered medications for this encounter.   BP 118/64   Pulse 60   Wt 120.7 kg (266 lb 3.2 oz)   SpO2 96%   BMI 34.18 kg/m   Wt Readings from Last 3 Encounters:  08/18/23 120.7 kg (266 lb 3.2 oz)  05/20/23 110.3 kg (243 lb 3.2 oz)  04/30/23 107.2 kg (236 lb 6.4 oz)   PHYSICAL EXAM: General:  NAD. No resp difficulty, walked into clinic HEENT: Normal Neck: Supple. JVP 10 Cor: Irregular rate & rhythm. No rubs, gallops or murmurs. Lungs: Clear Abdomen: Soft, nontender, +distended.  Extremities: No cyanosis, clubbing, rash, edema Neuro: Alert & oriented x 3, moves all 4 extremities w/o difficulty. Affect pleasant.  ReDs reading: unable to obtain reading  ECG (personally reviewed): NSR with frequent  PVCs  ASSESSMENT & PLAN: 1. Chronic Systolic Heart Failure  - Echo 4/22: EF 20-25%, global HK, RV mildly reduced  - Cath 07/19/20: EF 15% normal cors. Preserved output. LVEDP 22 - cMRI 07/21/20: EF < 25%, RV moderately reduced.  - Echo 03/15/21: EF 40-45% -> lost to f/u. Off meds.  - Admitted 1/25 with ADHF. - Echo 1/25: EF 30-35%, RV mildly reduced  - RHC 1/25: RA 4, PAP 28/14 mmHg, PCWP 14, CO/CI 5.7/2.4 (Fick) - NYHA IIb-III. Volume up on exam - Change Lasix  to 40 mg daily, add 20 KCL daily - Continue losartan  25  mg daily.  - Continue Toprol  XL 25 mg daily.  - Continue Farxiga  10 mg daily. - Continue spiro 25 mg daily. - Repeat to assess for LV improvement - Labs today, repeat BMET in 10-14 days  2. PVCs - Frequent on ECG today - Place 2 week Zio to quantify - Check Mag, TSH and BMET - Needs sleep study, but un-insured   3. CKD IIIa-b  - suspect hypertensive nephropathy - Baseline SCr 1.4-1.8 - Continue SGLT2i/ARB - Labs today  4. Morbid obesity  - Body mass index is 34.18 kg/m. - Consider GLP1RA  5. HTN  - BP stable - Meds as above  6. Snoring - Needs in-lab sleep study, will wait until he is insured  7. SDOH - uninsured - LCSW helping with resources=>medicaid - Meds thru HF fund - Needs PCP  Update echo. Follow up in 3-4 weeks with APP and 3-4 months with Dr. Bensimhon  Harshal Sirmon M Joanthony Hamza, FNP  08/18/23 8:44 AM

## 2023-08-18 ENCOUNTER — Inpatient Hospital Stay (HOSPITAL_COMMUNITY)
Admission: RE | Admit: 2023-08-18 | Discharge: 2023-08-18 | Disposition: A | Discharge status: Home / Self Care | Source: Ambulatory Visit | Attending: Internal Medicine | Admitting: Internal Medicine

## 2023-08-18 ENCOUNTER — Other Ambulatory Visit (HOSPITAL_COMMUNITY): Payer: Self-pay

## 2023-08-18 ENCOUNTER — Telehealth (HOSPITAL_COMMUNITY): Payer: Self-pay | Admitting: *Deleted

## 2023-08-18 ENCOUNTER — Ambulatory Visit (HOSPITAL_COMMUNITY)
Admission: RE | Admit: 2023-08-18 | Discharge: 2023-08-18 | Disposition: A | Discharge status: Home / Self Care | Payer: 59 | Source: Ambulatory Visit | Attending: Family Medicine | Admitting: Family Medicine

## 2023-08-18 ENCOUNTER — Encounter (HOSPITAL_COMMUNITY): Payer: Self-pay

## 2023-08-18 ENCOUNTER — Other Ambulatory Visit (HOSPITAL_COMMUNITY): Payer: Self-pay | Admitting: Internal Medicine

## 2023-08-18 VITALS — BP 118/64 | HR 60 | Wt 266.2 lb

## 2023-08-18 DIAGNOSIS — I428 Other cardiomyopathies: Secondary | ICD-10-CM | POA: Insufficient documentation

## 2023-08-18 DIAGNOSIS — I493 Ventricular premature depolarization: Secondary | ICD-10-CM | POA: Diagnosis not present

## 2023-08-18 DIAGNOSIS — N1832 Chronic kidney disease, stage 3b: Secondary | ICD-10-CM | POA: Insufficient documentation

## 2023-08-18 DIAGNOSIS — I5022 Chronic systolic (congestive) heart failure: Secondary | ICD-10-CM

## 2023-08-18 DIAGNOSIS — Z6834 Body mass index (BMI) 34.0-34.9, adult: Secondary | ICD-10-CM | POA: Diagnosis not present

## 2023-08-18 DIAGNOSIS — N183 Chronic kidney disease, stage 3 unspecified: Secondary | ICD-10-CM

## 2023-08-18 DIAGNOSIS — Z79899 Other long term (current) drug therapy: Secondary | ICD-10-CM | POA: Insufficient documentation

## 2023-08-18 DIAGNOSIS — I130 Hypertensive heart and chronic kidney disease with heart failure and stage 1 through stage 4 chronic kidney disease, or unspecified chronic kidney disease: Secondary | ICD-10-CM | POA: Diagnosis not present

## 2023-08-18 DIAGNOSIS — Z5971 Insufficient health insurance coverage: Secondary | ICD-10-CM | POA: Diagnosis not present

## 2023-08-18 DIAGNOSIS — Z8616 Personal history of COVID-19: Secondary | ICD-10-CM | POA: Diagnosis not present

## 2023-08-18 DIAGNOSIS — Z7984 Long term (current) use of oral hypoglycemic drugs: Secondary | ICD-10-CM | POA: Insufficient documentation

## 2023-08-18 DIAGNOSIS — Z5986 Financial insecurity: Secondary | ICD-10-CM | POA: Diagnosis not present

## 2023-08-18 DIAGNOSIS — Z139 Encounter for screening, unspecified: Secondary | ICD-10-CM

## 2023-08-18 DIAGNOSIS — R0683 Snoring: Secondary | ICD-10-CM | POA: Diagnosis not present

## 2023-08-18 DIAGNOSIS — I10 Essential (primary) hypertension: Secondary | ICD-10-CM

## 2023-08-18 LAB — BASIC METABOLIC PANEL WITH GFR
Anion gap: 8 (ref 5–15)
BUN: 17 mg/dL (ref 6–20)
CO2: 26 mmol/L (ref 22–32)
Calcium: 9.9 mg/dL (ref 8.9–10.3)
Chloride: 104 mmol/L (ref 98–111)
Creatinine, Ser: 1.59 mg/dL — ABNORMAL HIGH (ref 0.61–1.24)
GFR, Estimated: 51 mL/min — ABNORMAL LOW (ref >60)
Glucose, Bld: 113 mg/dL — ABNORMAL HIGH (ref 70–99)
Potassium: 4.7 mmol/L (ref 3.5–5.1)
Sodium: 138 mmol/L (ref 135–145)

## 2023-08-18 LAB — BRAIN NATRIURETIC PEPTIDE: B Natriuretic Peptide: 73.3 pg/mL (ref 0.0–100.0)

## 2023-08-18 LAB — MAGNESIUM: Magnesium: 2.2 mg/dL (ref 1.7–2.4)

## 2023-08-18 LAB — TSH: TSH: 3.308 u[IU]/mL (ref 0.350–4.500)

## 2023-08-18 MED ORDER — POTASSIUM CHLORIDE CRYS ER 20 MEQ PO TBCR
20.0000 meq | EXTENDED_RELEASE_TABLET | Freq: Every day | ORAL | 8 refills | Status: DC
  Filled 2023-08-18: qty 30, 30d supply, fill #0

## 2023-08-18 MED ORDER — FUROSEMIDE 40 MG PO TABS
40.0000 mg | ORAL_TABLET | Freq: Every day | ORAL | 8 refills | Status: DC
  Filled 2023-08-18: qty 30, 30d supply, fill #0

## 2023-08-18 MED ORDER — FUROSEMIDE 40 MG PO TABS: 40.0000 mg | ORAL_TABLET | Freq: Every day | ORAL | Status: DC | PRN

## 2023-08-18 MED ORDER — POTASSIUM CHLORIDE CRYS ER 20 MEQ PO TBCR: 20.0000 meq | EXTENDED_RELEASE_TABLET | Freq: Every day | ORAL | Status: DC | PRN

## 2023-08-18 NOTE — Patient Instructions (Addendum)
 Thank you for coming in today  If you had labs drawn today, any labs that are abnormal the clinic will call you No news is good news  Medications: Change Lasix  to 40 mg daily Start Potassium 20 meq daily  Your provider has recommended that  you wear a Zio Patch for 14 days.  This monitor will record your heart rhythm for our review.  IF you have any symptoms while wearing the monitor please press the button.  If you have any issues with the patch or you notice a red or orange light on it please call the company at [phone].  Once you remove the patch please mail it back to the company as soon as possible so we can get the results.  Your physician has requested that you have an echocardiogram. Echocardiography is a painless test that uses sound waves to create images of your heart. It provides your doctor with information about the size and shape of your heart and how well your heart's chambers and valves are working. This procedure takes approximately one hour. There are no restrictions for this procedure.      Follow up appointments:  Your physician recommends that you schedule a follow-up appointment in:  3-4 weeks in clinic   3-4 months With Dr. Julane Ny Please call our office to schedule the follow-up appointment in June 2025.     Do the following things EVERYDAY: Weigh yourself in the morning before breakfast. Write it down and keep it in a log. Take your medicines as prescribed Eat low salt foods--Limit salt (sodium) to 2000 mg per day.  Stay as active as you can everyday Limit all fluids for the day to less than 2 liters   At the [facility], you and your health needs are our priority. As part of our continuing mission to provide you with exceptional heart care, we have created designated Provider Care Teams. These Care Teams include your primary Cardiologist (physician) and Advanced Practice Providers (APPs- Physician Assistants and Nurse  Practitioners) who all work together to provide you with the care you need, when you need it.   You may see any of the following providers on your designated Care Team at your next follow up: Dr Jules Oar Dr Peder Bourdon Dr. Mimi Alt, NP Ruddy Corral, [place] [facility] [place], [place] Dennise Fitz, NP Luster Salters, PharmD   Please be sure to bring in all your medications bottles to every appointment.    Thank you for choosing [facility]  If you have any questions or concerns before your next appointment please send us  a message through [place] or call our office at [phone].    TO LEAVE A MESSAGE FOR THE NURSE SELECT OPTION 2, PLEASE LEAVE A MESSAGE INCLUDING: YOUR NAME DATE OF BIRTH CALL BACK NUMBER REASON FOR CALL**this is important as we prioritize the call backs  YOU WILL RECEIVE A CALL BACK THE SAME DAY AS LONG AS YOU CALL BEFORE 4:00 PM

## 2023-08-18 NOTE — Progress Notes (Signed)
 H&V Care Navigation CSW Progress Note  Clinical Social Worker met with pt regarding current lack of insurance.  Pt lost insurance March 8th due to lay offs with his work and has not obtained insurance since then.  Pt only source of income at this time is unemployment at $350/week- so about $1,400/month.  States this has been sufficient to pay for his basic expenses at this time.  Pt is below income guidelines for Medicaid so provided him information about [facility] DHHS workers- he will go to apply for medicaid following this appt.  Also provided with [facility] insurance information if he is denied for Medicaid to see if he can qualify for special enrollment period.   SDOH Screenings   Food Insecurity: No Food Insecurity (04/26/2023)  Housing: Low Risk  (04/26/2023)  Transportation Needs: No Transportation Needs (04/26/2023)  Utilities: Not At Risk (04/26/2023)  Financial Resource Strain: Medium Risk (08/18/2023)  Tobacco Use: Low Risk  (05/20/2023)   Denton Flakes, LCSW Clinical Social Worker [facility] Desk#: [phone] Cell#: [phone]

## 2023-08-18 NOTE — Telephone Encounter (Signed)
 Pt aware, agreeable, and verbalized understanding

## 2023-08-18 NOTE — Telephone Encounter (Signed)
-----   Message from [place] sent at 08/18/2023 12:44 PM EDT ----- Only take Lasix  40 w/ 20 KCL daily x 3 days, then can go to PRN

## 2023-09-01 ENCOUNTER — Telehealth (HOSPITAL_COMMUNITY): Payer: Self-pay | Admitting: Cardiology

## 2023-09-01 ENCOUNTER — Ambulatory Visit (HOSPITAL_COMMUNITY)
Admission: RE | Admit: 2023-09-01 | Discharge: 2023-09-01 | Disposition: A | Discharge status: Home / Self Care | Source: Ambulatory Visit | Attending: Cardiology | Admitting: Cardiology

## 2023-09-01 ENCOUNTER — Ambulatory Visit (HOSPITAL_COMMUNITY): Payer: Self-pay | Admitting: Family Medicine

## 2023-09-01 DIAGNOSIS — I5022 Chronic systolic (congestive) heart failure: Secondary | ICD-10-CM | POA: Insufficient documentation

## 2023-09-01 LAB — BASIC METABOLIC PANEL WITH GFR
Anion gap: 9 (ref 5–15)
BUN: 18 mg/dL (ref 6–20)
CO2: 25 mmol/L (ref 22–32)
Calcium: 9.6 mg/dL (ref 8.9–10.3)
Chloride: 105 mmol/L (ref 98–111)
Creatinine, Ser: 1.59 mg/dL — ABNORMAL HIGH (ref 0.61–1.24)
GFR, Estimated: 51 mL/min — ABNORMAL LOW (ref >60)
Glucose, Bld: 137 mg/dL — ABNORMAL HIGH (ref 70–99)
Potassium: 4.2 mmol/L (ref 3.5–5.1)
Sodium: 139 mmol/L (ref 135–145)

## 2023-09-01 MED ORDER — SPIRONOLACTONE 25 MG PO TABS: 25.0000 mg | ORAL_TABLET | Freq: Every day | ORAL | 6 refills | Status: AC

## 2023-09-01 MED ORDER — METOPROLOL SUCCINATE ER 25 MG PO TB24: 25.0000 mg | ORAL_TABLET | Freq: Every evening | ORAL | 6 refills | Status: DC

## 2023-09-01 MED ORDER — LOSARTAN POTASSIUM 25 MG PO TABS: 25.0000 mg | ORAL_TABLET | Freq: Every day | ORAL | 6 refills | Status: DC

## 2023-09-01 NOTE — Telephone Encounter (Signed)
 Pt need refills on Metoprolol  25 mg, Losartan  25 mg, Spironolactone  25 mg, please send scripts to [facility], If you have any questions pt can be reached [phone]. Thanks

## 2023-09-01 NOTE — Addendum Note (Signed)
 Addended by: Edris Gowers on: 09/01/2023 09:11 AM   Modules accepted: Orders

## 2023-09-01 NOTE — Telephone Encounter (Signed)
 Refills sent

## 2023-09-15 ENCOUNTER — Telehealth (HOSPITAL_COMMUNITY): Payer: Self-pay

## 2023-09-15 NOTE — Telephone Encounter (Signed)
 Called to confirm/remind patient of their appointment at the [facility] on 09/16/23.   Appointment:   [x] Confirmed  [] Left mess   [] No answer/No voice mail  [] VM Full/unable to leave message  [] Phone not in service  Patient reminded to bring all medications and/or complete list.  Confirmed patient has transportation. Gave directions, instructed to utilize valet parking.

## 2023-09-15 NOTE — Progress Notes (Signed)
 [facility] Note    PCP: None  HF Cardiologist: Dr. Julane Ny   HPI: [name] is a 53 y.o. AAM w/ h/o COVID 19 infection 03/2020, HTN, chronic renal insuffiency and new diagnosis of systolic HF.  Presented to [facility] on 07/13/20 with increased dyapnea and edema. Dyspnea started shortly after being diagnosed w/ COVID. ECHO showed severely reduced EF 20-25% and RV mildly reduced. Started on IV lasix . Transferred to [facility] on 07/18/20. R/LHC showed normal coronaries, severe NICM EF 15% w/ volume overload and preserved CO. GDMT further titrated. cMRI LVEF < 25% and moderately reduced RV -->c/w NICM.   At his follow up 4/22, he did not start his metoprolol  due to cost. AHF follow up 6/22 he had been out of meds for a week due to cost.  Echo 03/15/21 EF 40-45%.   Never f/u'd in HF Clinic.   Admitted 1/25 with ADHF in setting of Flu. EF 30-35%. Underwent RHC RA 4 PA 28/14 (21) PCWP 14 Fick 5.7/2.4 TD 4.5/1.9.  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  Today he returns for HF follow up. Overall feeling fair. SOB with activity, including ADLs. Feels occasional palpitations, rare atypical CP. Rare positional dizziness. Abdomen bloated. Denies abnormal bleeding or PND/Orthopnea. Appetite ok. He is not weighing at home. Taking all medications. No ETOH, tobacco or drug use.  No longer working at [employer], currently unemployed. Snores. Does not have PCP.  Cardiac Studies  - RHC 1/25: RA 4, PA 28/14 (21), PCWP 14, Fick 5.7/2.4, TD 4.5/1.9  - Echo 1/25: EF 30-35%  - Echo 12/22: EF 40-45%  - Echo (4/22): EF 20-25% w/ global HK, mild LVH, RV moderately enlarged and systolic function mildly reduced. No significant valvular disease. Only mild MR and mild TR. RVSP normal at 23 mmHg.   - R/LHC 07/19/20 Ao = 103/79 (89) LV = 102/22 RA =  11 RV = 50/12 PA = 42/17 (26) PCW = 14 Fick cardiac output/index = 7.9/3.4 PVR = 1.5 WU SVR = 791 Ao  sat = 98% PA sat = 75%, 76% SVC sat - 75%   Assessment:  1. Normal coronary arteries 2. Severe NICM EF 15% 3. Volume overload with preserved CO   - cMRI 07/20/20 1. Moderately dilated LV with mild LVH, EF 23%.  2. Moderately dilated RV with EF 21%.  3. Nonspecific inferior RV insertion site LGE suggestive of pressure/volume overload.  4. Extracellular volume percentage not significantly elevated and T2 signal not high.  ROS: All systems negative except as listed in HPI, PMH and Problem List.  SH:  Social History   Socioeconomic History   Marital status: Single    Spouse name: Not on file   Number of children: Not on file   Years of education: Not on file   Highest education level: Not on file  Occupational History   Not on file  Tobacco Use   Smoking status: Never   Smokeless tobacco: Never  Vaping Use   Vaping status: Never Used  Substance and Sexual Activity   Alcohol use: Never   Drug use: Never   Sexual activity: Not on file  Other Topics Concern   Not on file  Social History Narrative   Not on file   Social Drivers of Health   Financial Resource Strain: Medium Risk (08/18/2023)   Overall Financial Resource Strain (CARDIA)    Difficulty of Paying Living Expenses: Somewhat hard  Food Insecurity: No Food Insecurity (04/26/2023)   Hunger Vital Sign    Worried About Running Out of Food in the Last Year: Never true    Ran Out of Food in the Last Year: Never true  Transportation Needs: No Transportation Needs (04/26/2023)   PRAPARE - Administrator, Civil Service (Medical): No    Lack of Transportation (Non-Medical): No  Physical Activity: Not on file  Stress: Not on file  Social Connections: Not on file  Intimate Partner Violence: Not At Risk (04/26/2023)   Humiliation, Afraid, Rape, and Kick questionnaire    Fear of Current or Ex-Partner: No    Emotionally Abused: No    Physically Abused: No    Sexually Abused: No   FH:  Family History  Problem  Relation Age of Onset   Heart failure Neg Hx    Past Medical History:  Diagnosis Date   Acute systolic heart failure (HCC) 07/18/2020   AKI (acute kidney injury) (HCC) 03/15/2020   Chronic kidney disease    Chronic systolic heart failure (HCC) 11/09/2020   Elevated hemoglobin A1c measurement 04/26/2023   6.7% in 2022     Essential hypertension 03/15/2020   Nonischemic cardiomyopathy (HCC) 04/26/2023   Pneumonia due to COVID-19 virus 03/14/2020   Current Outpatient Medications  Medication Sig Dispense Refill   dapagliflozin  propanediol (FARXIGA ) 10 MG TABS tablet Take 1 tablet (10 mg total) by mouth daily. 30 tablet 1   furosemide  (LASIX ) 40 MG tablet Take 1 tablet (40 mg total) by mouth daily as needed.     losartan  (COZAAR ) 25 MG tablet Take 1 tablet (25 mg total) by mouth at bedtime. 30 tablet 6   metoprolol  succinate (TOPROL -XL) 25 MG 24 hr tablet Take 1 tablet (25 mg total) by mouth at bedtime. 30 tablet 6   potassium chloride  SA (KLOR-CON  M) 20 MEQ tablet Take 1 tablet (20 mEq total) by mouth daily as needed.     spironolactone  (ALDACTONE ) 25 MG tablet Take 1 tablet (25 mg total) by mouth daily. 30 tablet 6   No current facility-administered medications for this encounter.   BP (!) 134/98   Pulse 89   Wt 122.4 kg (269 lb 12.8 oz)   SpO2 97%   BMI 34.64 kg/m   Wt Readings from Last 3 Encounters:  09/16/23 122.4 kg (269 lb 12.8 oz)  08/18/23 120.7 kg (266 lb 3.2 oz)  05/20/23 110.3 kg (243 lb 3.2 oz)   PHYSICAL EXAM: General:  NAD. No resp difficulty, walked into clinic HEENT: Normal Neck: Supple. Thick neck, JVP 10 Cor: Regular rate & rhythm. No rubs, gallops or murmurs. Lungs: Clear Abdomen: Soft, nontender, +distended.  Extremities: No cyanosis, clubbing, rash, edema Neuro: Alert & oriented x 3, moves all 4 extremities w/o difficulty. Affect pleasant.  ReDs reading: 44 %, abnormal  ASSESSMENT & PLAN: 1. Chronic Systolic Heart Failure  - Echo 4/22: EF 20-25%,  global HK, RV mildly reduced  - Cath 07/19/20: EF 15% normal cors. Preserved output. LVEDP 22 - cMRI 07/21/20: EF < 25%, RV moderately reduced.  - Echo 03/15/21: EF 40-45% -> lost to f/u. Off meds.  - Admitted 1/25 with ADHF. - Echo 1/25: EF 30-35%, RV mildly reduced  - RHC 1/25: RA 4, PAP 28/14 mmHg, PCWP 14, CO/CI 5.7/2.4 (Fick) - NYHA IIb-III. Volume up on exam, ReDs 44% - Stop losartan  - Start Entresto  24/26 mg bid - Change lasix  to 40 mg daily + 20 KCL daily. - Continue Toprol   XL 25 mg daily.  - Continue Farxiga  10 mg daily. - Continue spiro 25 mg daily. - Repeat echo arranged for 09/29/23. 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 If contributing to CM - Continue Toprol  - Arrange sleep study - May need PVC ablation, but has 2 morphologies   3. 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 Morbid obesity  - Body mass index is 34.64 kg/m. - Consider GLP1RA - Needs PCP  5. HTN  - BP up today - Starting Entresto  as above  6. Snoring - Now insured, arrange in-lab sleep study  Follow up in 2 months with Dr. Arrie Bienenstock, FNP  09/16/23 8:35 AM

## 2023-09-16 ENCOUNTER — Other Ambulatory Visit (HOSPITAL_COMMUNITY): Payer: Self-pay

## 2023-09-16 ENCOUNTER — Ambulatory Visit (HOSPITAL_COMMUNITY)
Admission: RE | Admit: 2023-09-16 | Discharge: 2023-09-16 | Disposition: A | Discharge status: Home / Self Care | Source: Ambulatory Visit | Attending: Family Medicine | Admitting: Family Medicine

## 2023-09-16 ENCOUNTER — Encounter (HOSPITAL_COMMUNITY): Payer: Self-pay

## 2023-09-16 VITALS — BP 134/98 | HR 89 | Wt 269.8 lb

## 2023-09-16 DIAGNOSIS — N183 Chronic kidney disease, stage 3 unspecified: Secondary | ICD-10-CM

## 2023-09-16 DIAGNOSIS — I10 Essential (primary) hypertension: Secondary | ICD-10-CM | POA: Diagnosis not present

## 2023-09-16 DIAGNOSIS — Z6834 Body mass index (BMI) 34.0-34.9, adult: Secondary | ICD-10-CM | POA: Diagnosis not present

## 2023-09-16 DIAGNOSIS — R0683 Snoring: Secondary | ICD-10-CM | POA: Diagnosis not present

## 2023-09-16 DIAGNOSIS — I493 Ventricular premature depolarization: Secondary | ICD-10-CM | POA: Diagnosis not present

## 2023-09-16 DIAGNOSIS — N1832 Chronic kidney disease, stage 3b: Secondary | ICD-10-CM | POA: Diagnosis not present

## 2023-09-16 DIAGNOSIS — I5022 Chronic systolic (congestive) heart failure: Secondary | ICD-10-CM | POA: Diagnosis not present

## 2023-09-16 DIAGNOSIS — I130 Hypertensive heart and chronic kidney disease with heart failure and stage 1 through stage 4 chronic kidney disease, or unspecified chronic kidney disease: Secondary | ICD-10-CM | POA: Insufficient documentation

## 2023-09-16 DIAGNOSIS — N189 Chronic kidney disease, unspecified: Secondary | ICD-10-CM | POA: Diagnosis present

## 2023-09-16 MED ORDER — POTASSIUM CHLORIDE CRYS ER 20 MEQ PO TBCR: 20.0000 meq | EXTENDED_RELEASE_TABLET | Freq: Every day | ORAL | 5 refills | Status: AC

## 2023-09-16 MED ORDER — FUROSEMIDE 40 MG PO TABS: 40.0000 mg | ORAL_TABLET | Freq: Every day | ORAL | 5 refills | Status: DC

## 2023-09-16 MED ORDER — ENTRESTO 24-26 MG PO TABS: 1.0000 | ORAL_TABLET | Freq: Two times a day (BID) | ORAL | 3 refills | Status: DC

## 2023-09-16 NOTE — Progress Notes (Signed)
 ReDS Vest / Clip - 09/16/23 0800       ReDS Vest / Clip   Station Marker D    Ruler Value 37    ReDS Value Range High volume overload    ReDS Actual Value 44

## 2023-09-16 NOTE — Patient Instructions (Addendum)
 Medication Changes:  STOP LOSARTAN    START ENTRESTO  24/26MG  TWICE DAILY   TAKE LASIX  (FUROSEMIDE ) 40MG  ONCE DAILY   TAKE POTASSIUM 20MEQ ONCE DAILY   Lab Work:  PLEASE RETURN FOR LABS AS SCHEDULED IN 10 DAYS   Testing/Procedures:  Your physician has recommended that you have a sleep study. This test records several body functions during sleep, including: brain activity, eye movement, oxygen and carbon dioxide blood levels, heart rate and rhythm, breathing rate and rhythm, the flow of air through your mouth and nose, snoring, body muscle movements, and chest and belly movement. SCHEDULING WILL REACH OUT TO YOU TO GET THIS SCHEDULED ONCE APPROVED WITH INSURANCE   PLEASE CALL [facility] FOR A NEW PATIENT APPOINTMENT TO ESTABLISH WITH PRIMARY CARE [phone]  Follow-Up in: 2 MONTHS WITH DR. Julane Ny PLEASE CALL OUR OFFICE AROUND JULY TO GET SCHEDULED FOR YOUR APPOINTMENT. PHONE NUMBER IS [phone] OPTION 2   At the [facility], you and your health needs are our priority. We have a designated team specialized in the treatment of Heart Failure. This Care Team includes your primary Heart Failure Specialized Cardiologist (physician), Advanced Practice Providers (APPs- Physician Assistants and Nurse Practitioners), and Pharmacist who all work together to provide you with the care you need, when you need it.   You may see any of the following providers on your designated Care Team at your next follow up:  Dr. Jules Oar Dr. Peder Bourdon Dr. Alwin Baars Dr. Judyth Nunnery Nieves Bars, NP Ruddy Corral, [place] [facility] [place], [place] Dennise Fitz, NP [place] Lee, NP Luster Salters, PharmD   Please be sure to bring in all your medications bottles to every appointment.   Need to Contact Us :  If you have any questions or concerns before your next appointment please send us  a message through [facility] or call our office at  [phone].    TO LEAVE A MESSAGE FOR THE NURSE SELECT OPTION 2, PLEASE LEAVE A MESSAGE INCLUDING: YOUR NAME DATE OF BIRTH CALL BACK NUMBER REASON FOR CALL**this is important as we prioritize the call backs  YOU WILL RECEIVE A CALL BACK THE SAME DAY AS LONG AS YOU CALL BEFORE 4:00 PM

## 2023-09-26 ENCOUNTER — Ambulatory Visit (HOSPITAL_COMMUNITY)
Admission: RE | Admit: 2023-09-26 | Discharge: 2023-09-26 | Disposition: A | Discharge status: Home / Self Care | Source: Ambulatory Visit | Attending: Cardiology

## 2023-09-26 ENCOUNTER — Ambulatory Visit (HOSPITAL_COMMUNITY): Payer: Self-pay | Admitting: Family Medicine

## 2023-09-26 DIAGNOSIS — I5022 Chronic systolic (congestive) heart failure: Secondary | ICD-10-CM | POA: Diagnosis not present

## 2023-09-26 LAB — BASIC METABOLIC PANEL WITH GFR
Anion gap: 13 (ref 5–15)
BUN: 18 mg/dL (ref 6–20)
CO2: 21 mmol/L — ABNORMAL LOW (ref 22–32)
Calcium: 9.5 mg/dL (ref 8.9–10.3)
Chloride: 103 mmol/L (ref 98–111)
Creatinine, Ser: 1.76 mg/dL — ABNORMAL HIGH (ref 0.61–1.24)
GFR, Estimated: 45 mL/min — ABNORMAL LOW (ref >60)
Glucose, Bld: 129 mg/dL — ABNORMAL HIGH (ref 70–99)
Potassium: 4.7 mmol/L (ref 3.5–5.1)
Sodium: 137 mmol/L (ref 135–145)

## 2023-09-29 ENCOUNTER — Ambulatory Visit (HOSPITAL_COMMUNITY)
Admission: RE | Admit: 2023-09-29 | Discharge: 2023-09-29 | Disposition: A | Discharge status: Home / Self Care | Source: Ambulatory Visit | Attending: Internal Medicine | Admitting: Internal Medicine

## 2023-09-29 DIAGNOSIS — I77810 Thoracic aortic ectasia: Secondary | ICD-10-CM | POA: Insufficient documentation

## 2023-09-29 DIAGNOSIS — I5022 Chronic systolic (congestive) heart failure: Secondary | ICD-10-CM | POA: Diagnosis not present

## 2023-09-29 LAB — ECHOCARDIOGRAM COMPLETE
Area-P 1/2: 3.85 cm2
Calc EF: 31.8 %
S' Lateral: 5.5 cm
Single Plane A2C EF: 29.2 %
Single Plane A4C EF: 35 %

## 2023-11-06 ENCOUNTER — Ambulatory Visit (HOSPITAL_BASED_OUTPATIENT_CLINIC_OR_DEPARTMENT_OTHER): Attending: Family Medicine | Admitting: Cardiology

## 2023-11-27 ENCOUNTER — Ambulatory Visit: Payer: Self-pay | Admitting: Nurse Practitioner

## 2024-01-19 ENCOUNTER — Other Ambulatory Visit (HOSPITAL_COMMUNITY): Payer: Self-pay | Admitting: Family Medicine

## 2024-02-16 ENCOUNTER — Other Ambulatory Visit (HOSPITAL_COMMUNITY): Payer: Self-pay | Admitting: Family Medicine

## 2024-02-17 DIAGNOSIS — R351 Nocturia: Secondary | ICD-10-CM | POA: Insufficient documentation

## 2024-02-17 DIAGNOSIS — Z0001 Encounter for general adult medical examination with abnormal findings: Secondary | ICD-10-CM | POA: Insufficient documentation

## 2024-02-17 NOTE — Progress Notes (Signed)
 Subjective:  Patient ID: Tony Meyer, male    DOB: [birth year], 53 y.o.   MRN: [id number]  Patient Care Team: Deitra Morton Sebastian Nena, NP as PCP - General (Nurse Practitioner) Alvan Dorn FALCON, MD as PCP - Cardiology (Cardiology)   Chief Complaint:  Establish Care   HPI: Tony Meyer is a 53 y.o. male presenting on 02/18/2024 for Establish Care   Discussed the use of AI scribe software for clinical note transcription with the patient, who gave verbal consent to proceed.  History of Present Illness Tony Meyer is a 53 year old male with chronic kidney disease, hypertension, and diabetes who presents to establish care.  He has chronic kidney disease with a recent creatinine level of 1.76 and an eGFR of 45. His eGFR was 51 five months ago. He consumes approximately two liters of water daily. His current medications include Aldactone  25 mg daily, potassium supplement 20 mEq daily, and Lasix  40 mg daily.  He has diabetes with an A1c of 6.5% in January 2025, which has improved to 5.9% currently. He was unaware of his diabetes diagnosis until recently and has not had regular A1c monitoring in the past. He is currently managed with Farxiga  10 mg daily.  He has chronic heart failure and is under the care of a cardiologist. He experiences shortness of breath, which he attributes to his heart condition. He avoids salt and fried foods. His medications include Entresto  24/26 mg twice a day and metoprolol  25 mg at night.  He reports persistent back pain, rated as 7-8 out of 10 during episodes of bending or lifting. The pain is located in the lower back and does not radiate down his legs. He has not taken any medication for the back pain due to concerns about interactions with his current medications.  He experiences excessive sweating and heat intolerance, which are new symptoms for him. He wears glasses but does not see an eye doctor regularly, despite being diabetic.  He is  currently on disability due to his chronic heart failure and has paperwork related to his disability status.       02/18/2024   10:04 AM  PHQ9 SCORE ONLY  PHQ-9 Total Score 0       02/18/2024   10:04 AM  GAD 7 : Generalized Anxiety Score  Nervous, Anxious, on Edge 0  Control/stop worrying 0  Worry too much - different things 0  Trouble relaxing 0  Restless 0  Easily annoyed or irritable 0  Afraid - awful might happen 0  Total GAD 7 Score 0  Anxiety Difficulty Not difficult at all      Relevant past medical, surgical, family, and social history reviewed and updated as indicated.  Allergies and medications reviewed and updated. Data reviewed: Chart in Epic.   Past Medical History:  Diagnosis Date   Acute systolic heart failure (HCC) 07/18/2020   AKI (acute kidney injury) 03/15/2020   Chronic kidney disease    Chronic systolic heart failure (HCC) 11/09/2020   Elevated hemoglobin A1c measurement 04/26/2023   6.7% in 2022     Essential hypertension 03/15/2020   Nonischemic cardiomyopathy (HCC) 04/26/2023   Pneumonia due to COVID-19 virus 03/14/2020    Past Surgical History:  Procedure Laterality Date   HERNIA REPAIR     RIGHT HEART CATH N/A 04/28/2023   Procedure: RIGHT HEART CATH;  Surgeon: Gardenia Led, DO;  Location: [facility];  Service: Cardiovascular;  Laterality: N/A;  RIGHT/LEFT HEART CATH AND CORONARY ANGIOGRAPHY N/A 07/19/2020   Procedure: RIGHT/LEFT HEART CATH AND CORONARY ANGIOGRAPHY;  Surgeon: Cherrie Toribio SAUNDERS, MD;  Location: [facility];  Service: Cardiovascular;  Laterality: N/A;    Social History   Socioeconomic History   Marital status: Single    Spouse name: Not on file   Number of children: Not on file   Years of education: Not on file   Highest education level: Not on file  Occupational History   Not on file  Tobacco Use   Smoking status: Never   Smokeless tobacco: Never  Vaping Use   Vaping status: Never Used   Substance and Sexual Activity   Alcohol use: Never   Drug use: Never   Sexual activity: Not on file  Other Topics Concern   Not on file  Social History Narrative   Not on file   Social Drivers of Health   Financial Resource Strain: Medium Risk (08/18/2023)   Overall Financial Resource Strain (CARDIA)    Difficulty of Paying Living Expenses: Somewhat hard  Food Insecurity: No Food Insecurity (04/26/2023)   Hunger Vital Sign    Worried About Running Out of Food in the Last Year: Never true    Ran Out of Food in the Last Year: Never true  Transportation Needs: No Transportation Needs (04/26/2023)   PRAPARE - Administrator, Civil Service (Medical): No    Lack of Transportation (Non-Medical): No  Physical Activity: Not on file  Stress: Not on file  Social Connections: Not on file  Intimate Partner Violence: Not At Risk (04/26/2023)   Humiliation, Afraid, Rape, and Kick questionnaire    Fear of Current or Ex-Partner: No    Emotionally Abused: No    Physically Abused: No    Sexually Abused: No    Outpatient Encounter Medications as of 02/18/2024  Medication Sig   dapagliflozin  propanediol (FARXIGA ) 10 MG TABS tablet Take 1 tablet (10 mg total) by mouth daily.   ENTRESTO  24-26 MG Take 1 tablet by mouth 2 (two) times daily. PLEASE KEEP UPCOMING APPOINTMENT FOR MORE REFILLS   furosemide  (LASIX ) 40 MG tablet Take 1 tablet (40 mg total) by mouth daily.   lidocaine  (LIDODERM ) 5 % Place 1 patch onto the skin daily. Remove & Discard patch within 12 hours or as directed by MD   losartan  (COZAAR ) 25 MG tablet Take 25 mg by mouth daily.   metoprolol  succinate (TOPROL -XL) 25 MG 24 hr tablet Take 1 tablet (25 mg total) by mouth at bedtime.   potassium chloride  (KLOR-CON ) 10 MEQ tablet Take 10 mEq by mouth in the morning and at bedtime.   potassium chloride  SA (KLOR-CON  M) 20 MEQ tablet Take 1 tablet (20 mEq total) by mouth daily.   spironolactone  (ALDACTONE ) 25 MG tablet Take 1 tablet  (25 mg total) by mouth daily.   No facility-administered encounter medications on file as of 02/18/2024.    No Known Allergies  Review of Systems  Constitutional:  Negative for chills and fever.  Respiratory:  Negative for shortness of breath and wheezing.   Cardiovascular:  Negative for chest pain, palpitations and leg swelling.  Gastrointestinal:  Negative for abdominal pain, blood in stool, diarrhea, nausea and vomiting.  Musculoskeletal:  Positive for back pain.       7/10 with mvt  Skin:  Negative for itching and rash.  Neurological:  Negative for dizziness and headaches.  Psychiatric/Behavioral:  Negative for suicidal ideas. The patient does not have  insomnia.          Objective:  BP 118/74   Pulse (!) 43   Temp (!) 97.4 F (36.3 C) (Temporal)   Ht 6' 2 (1.88 m)   Wt 255 lb 3.2 oz (115.8 kg)   SpO2 97%   BMI 32.77 kg/m    Wt Readings from Last 3 Encounters:  02/18/24 255 lb 3.2 oz (115.8 kg)  09/16/23 269 lb 12.8 oz (122.4 kg)  08/18/23 266 lb 3.2 oz (120.7 kg)   BP Readings from Last 3 Encounters:  02/18/24 118/74  09/16/23 (!) 134/98  08/18/23 118/64    Physical Exam Vitals and nursing note reviewed.  Constitutional:      Appearance: He is obese.  HENT:     Head: Normocephalic and atraumatic.     Right Ear: Tympanic membrane, ear canal and external ear normal. There is no impacted cerumen.     Left Ear: Tympanic membrane, ear canal and external ear normal. There is no impacted cerumen.     Nose: Nose normal.     Mouth/Throat:     Mouth: Mucous membranes are moist.  Eyes:     General: No scleral icterus.    Extraocular Movements: Extraocular movements intact.     Conjunctiva/sclera: Conjunctivae normal.     Pupils: Pupils are equal, round, and reactive to light.  Neck:     Vascular: No carotid bruit.  Cardiovascular:     Heart sounds: Normal heart sounds.  Pulmonary:     Effort: Pulmonary effort is normal.     Breath sounds: Normal breath sounds.   Abdominal:     General: Bowel sounds are normal.     Palpations: Abdomen is soft.  Musculoskeletal:        General: Normal range of motion.     Cervical back: Normal range of motion and neck supple. No rigidity or tenderness.     Right lower leg: No edema.     Left lower leg: No edema.  Lymphadenopathy:     Cervical: No cervical adenopathy.  Skin:    General: Skin is warm and dry.     Findings: No rash.  Neurological:     Mental Status: He is alert and oriented to person, place, and time.  Psychiatric:        Mood and Affect: Mood normal.        Behavior: Behavior normal.        Thought Content: Thought content normal.        Judgment: Judgment normal.    Physical Exam VITALS: BP- 118/74     Results for orders placed or performed during the hospital encounter of 09/29/23  ECHOCARDIOGRAM COMPLETE   Collection Time: 09/29/23  8:32 AM  Result Value Ref Range   S' Lateral 5.50 cm   Single Plane A4C EF 35.0 %   Single Plane A2C EF 29.2 %   Calc EF 31.8 %   Area-P 1/2 3.85 cm2   Est EF 30 - 35%        Pertinent labs & imaging results that were available during my care of the patient were reviewed by me and considered in my medical decision making.  Assessment & Plan:  Tony Meyer was seen today for establish care.  Diagnoses and all orders for this visit:  Encounter for general adult medical examination with abnormal findings -     CMP14+EGFR -     CBC with Differential/Platelet -     Thyroid  Panel With TSH -  PSA, total and free -     Lipid panel -     Microalbumin / creatinine urine ratio -     Bayer DCA Hb A1c Waived -     Cologuard  Essential hypertension -     CMP14+EGFR  Type 2 diabetes mellitus with hyperglycemia, with long-term current use of insulin (HCC) -     Microalbumin / creatinine urine ratio -     Bayer DCA Hb A1c Waived  Class 1 obesity with serious comorbidity and body mass index (BMI) of 32.0 to 32.9 in adult, unspecified obesity type -      Thyroid  Panel With TSH -     Lipid panel  Nocturia -     PSA, total and free  Screening for colon cancer -     Cologuard  Chronic midline low back pain without sciatica -     lidocaine  (LIDODERM ) 5 %; Place 1 patch onto the skin daily. Remove & Discard patch within 12 hours or as directed by MD  Chronic systolic heart failure (HCC)     Assessment and Plan Tony Meyer is a 53 year old A1 surgery.  She fell and African-American male seen today to establish care, no acute distress Assessment & Plan Adult Wellness Visit Routine wellness visit to establish care. - Order colon cancer screening kit to be mailed. - Advise completion and return of kit within three days.  Chronic systolic heart failure Reports shortness of breath consistent with heart failure. - Continue current heart failure medications. - Advise avoiding fried foods. - Continue cardiologist follow-up.  Chronic kidney disease, stage 3 Stage 3a CKD with recent decline in kidney function (EGFR 45). Diuretics beneficial for heart but may affect kidneys. - Check kidney function with lab tests. - Advise drinking at least two liters of water daily. - Consider nephrologist referral if needed.  Type 2 diabetes mellitus A1c of 5.9 indicates good control. Previous A1c was 6.5 in January 2025. - Check A1c today. - Advise annual eye exams.  Essential hypertension Well-controlled with current medications. Blood pressure 118/74. - Continue current antihypertensive medications.  Obesity, class 1 Class 1 obesity. Discussed dietary modifications for heart health and weight management. - Advise dietary modifications including reducing salt and avoiding fried foods.  Low back pain Chronic low back pain exacerbated by movement. - Prescribe lidocaine  patch.  General Health Maintenance Discussed importance of regular screenings and lifestyle modifications. - Order thyroid  function test for hyperhidrosis evaluation. - Advise on  lifestyle modifications including hydration and dietary changes.    Labs: CBC, CMP, lipid, TSH, PSA result pending  Health maintenance: Cologuard ordered  Continue all other maintenance medications.  Follow up plan: Return in about 4 months (around 06/17/2024) for Chronic Diseases Maangement.   Continue healthy lifestyle choices, including diet (rich in fruits, vegetables, and lean proteins, and low in salt and simple carbohydrates) and exercise (at least 30 minutes of moderate physical activity daily).  Educational handout given for    Clinical References  Chronic Back Pain Chronic back pain is back pain that lasts longer than 3 months. The cause of your back pain may not be known. Some common causes include: Wear and tear (degenerative disease) of the bones, disks, or tissues that connect bones to each other (ligaments) in your back. Inflammation and stiffness in your back (arthritis). If you have chronic back pain, you may have times when the pain is more intense (flare-ups). You can also learn to manage the pain with home care. Follow these instructions  at home: Watch for any changes in your symptoms. Take these actions to help with your pain: Managing pain and stiffness     If told, put ice on the painful area. You may be told to apply ice for the first 24-48 hours after a flare-up starts. Put ice in a plastic bag. Place a towel between your skin and the bag. Leave the ice on for 20 minutes, 2-3 times per day. If told, apply heat to the affected area as often as told by your health care provider. Use the heat source that your provider recommends, such as a moist heat pack or a heating pad. Place a towel between your skin and the heat source. Leave the heat on for 20-30 minutes. If your skin turns bright red, remove the ice or heat right away to prevent skin damage. The risk of damage is higher if you cannot feel pain, heat, or cold. Try soaking in a warm tub. Activity         Avoid bending and other activities that make the pain worse. Have good posture when you stand or sit. When you stand, keep your upper back and neck straight, with your shoulders pulled back. Avoid slouching. When you sit, keep your back straight. Relax your shoulders. Do not round your shoulders or pull them backward. Do not sit or stand in one place for too long. Take brief periods of rest during the day. This will reduce your pain. Resting in a lying or standing position is often better than sitting to rest. When you rest for longer periods, mix in some mild activity or stretching between periods of rest. This will help to prevent stiffness and pain. Get regular exercise. Ask your provider what activities are safe for you. You may have to avoid lifting. Ask your provider how much you can safely lift. If you do lift, always use the right technique. This means you should: Bend your knees. Keep the load close to your body. Avoid twisting. Medicines Take over-the-counter and prescription medicines only as told by your provider. You may need to take medicines for pain and inflammation. These may be taken by mouth or put on the skin. You may also be given muscle relaxants. Ask your provider if the medicine prescribed to you: Requires you to avoid driving or using machinery. Can cause constipation. You may need to take these actions to prevent or treat constipation: Drink enough fluid to keep your pee (urine) pale yellow. Take over-the-counter or prescription medicines. Eat foods that are high in fiber, such as beans, whole grains, and fresh fruits and vegetables. Limit foods that are high in fat and processed sugars, such as fried or sweet foods. General instructions  Sleep on a firm mattress in a comfortable position. Try lying on your side with your knees slightly bent. If you lie on your back, put a pillow under your knees. Do not use any products that contain nicotine or tobacco.  These products include cigarettes, chewing tobacco, and vaping devices, such as e-cigarettes. If you need help quitting, ask your provider. Contact a health care provider if: You have pain that does not get better with rest or medicine. You have new pain. You have a fever. You lose weight quickly. You have trouble doing your normal activities. You feel weak or numb in one or both of your legs or feet. Get help right away if: You are not able to control when you pee or poop. You have severe back pain and:  Nausea or vomiting. Pain in your chest or abdomen. Shortness of breath. You faint. These symptoms may be an emergency. Get help right away. Call 911. Do not wait to see if the symptoms will go away. Do not drive yourself to the hospital. This information is not intended to replace advice given to you by your health care provider. Make sure you discuss any questions you have with your health care provider. Document Revised: 11/19/2021 Document Reviewed: 11/19/2021 Elsevier Patient Education  2024 [employer] Heart Failure and Exercise: What to Know Heart failure is a long-term condition where the heart can't pump enough blood through the body. When this happens, parts of the body don't get the blood and oxygen they need. Living with heart failure can be a challenge. But there are things you can do to help improve your symptoms. One thing is to follow the instructions from your health care provider about living a healthy lifestyle. This includes choosing the right exercise plan. Doing daily physical activity is important when you have heart failure. You may have some limits on your activity, so talk to your provider before doing any exercises. What are the benefits of exercise? Exercise may: Make your heart muscles stronger and help your body use oxygen better. This helps with symptoms like tiredness and shortness of breath. Help your bones stay strong. Improve your blood flow. Help  your mental health by lowering the risk of depression. Decrease your chance of going to the hospital for heart failure. Improve your risk factors by: Lowering your blood pressure. Lowering your cholesterol. Improving diabetes if you have it. Helping you to lose weight. What is an exercise plan? An exercise plan is a set of specific exercises and activities. You'll work with your provider to create the plan that works for you. The plan may include: Cardiac rehabilitation. These are supervised exercises that are designed to help your heart. Different types of exercises such as: Strengthening. Balance. Flexibility. Aerobic. What are strengthening exercises? Strengthening exercises involve using resistance to improve your muscle strength. These usually have repeated motions. They can include: Lifting weights. Using weight machines. Using resistance tubes and bands. Using your body weight, such as doing push-ups or squats. What are balance exercises? Balance exercises strengthen the muscles of the back, belly, and pelvis (core muscles). They improve your balance and can lower your risk of falling. These may include: Standing on one leg. Walking backward, sideways, and in a straight line. Standing up after sitting, without using your hands. Shifting your weight from one leg to the other. Doing tai chi. This uses slow movements and deep breathing. Doing yoga. How can I increase my flexibility? Flexibility exercises can lengthen your muscles, improve your range of motion, and help your joints. They can also lower your risk of falling. These may include: Doing tai chi. Doing yoga. Doing Pilates. Stretching. How much aerobic exercise should I get?  Aerobic exercise strengthens your breathing and blood flow. It also increases your body's use of oxygen. This type of exercise causes your heart to beat faster while you're doing it. Examples include biking, walking, and swimming.  Talk to your  provider to find out how much aerobic exercise is safe for you. To do this type of exercise: Start slowly, limiting the amount of time at first. You may need to start with 5 minutes every day. Slowly add more minutes until you can safely do at least 30 minutes, at least 5 days a week. This information is not intended  to replace advice given to you by your health care provider. Make sure you discuss any questions you have with your health care provider. Document Revised: 11/14/2022 Document Reviewed: 11/14/2022 Elsevier Patient Education  2024 [employer]. Heart Failure Action Plan A heart failure action plan helps you know what to do when you have symptoms of heart failure. Your action plan is a color-coded plan that lists the symptoms to watch for and indicates what actions to take. If you have symptoms in the green zone, you're doing well. If you have symptoms in the yellow zone, you're having problems. If you have symptoms in the red zone, you need medical care right away. Follow the plan that was created by you and your health care provider. Review your plan each time you visit your provider. Green zone These signs mean you're doing well and can continue what you're doing: You don't have new or worsening shortness of breath. You have very little swelling or no new swelling. Your weight is stable (no gain or loss). You have a normal activity level. You don't have chest pain or any other new symptoms. Yellow zone These signs and symptoms mean your condition may be getting worse and you should make some changes: You have trouble breathing when you're active. You have swelling in your feet or legs or have discomfort in your belly. You gain 2-3 lb (0.9-1.4 kg) in 24 hours, or 5 lb (2.3 kg) in a week. This amount may be more or less depending on your condition. You get tired easily. You have trouble sleeping. You have a dry cough. If you have any of these symptoms: Contact your provider  within the next day. Your provider may adjust your medicines. Red zone These signs and symptoms mean you should get medical help right away: You have trouble breathing when resting or cannot lie flat and you need to raise your head to help you breathe. You have a dry cough that's getting worse. You have swelling or pain in your feet or legs or discomfort in your belly that's getting worse. You suddenly gain more than 2-3 lb (0.9-1.4 kg) in 24 hours, or more than 5 lb (2.3 kg) in a week. This amount may be more or less depending on your condition. You have trouble staying awake or you feel confused. You don't have an appetite. You have worsening sadness or depression. These symptoms may be an emergency. Call 911 right away. Do not wait to see if the symptoms will go away. Do not drive yourself to the hospital. Follow these instructions at home: Take medicines only as told. Eat a heart-healthy diet. Work with a dietitian to create an eating plan that's best for you. Weigh yourself each day. Your target weight is __________ lb (__________ kg). Call your provider if you gain more than __________ lb (__________ kg) in 24 hours, or more than __________ lb (__________ kg) in a week. Health care provider name: _____________________________________________________ Health care provider phone number: _____________________________________________________ Where to find more information American Heart Association: heart.org This information is not intended to replace advice given to you by your health care provider. Make sure you discuss any questions you have with your health care provider. Document Revised: 11/14/2022 Document Reviewed: 11/14/2022 Elsevier Patient Education  2024 [employer] BMI for Adults Body mass index (BMI) is a number found using a person's weight and height. BMI can help tell how much of a person's weight is made up of fat. BMI does not measure body  fat directly. It is used  instead of tests that directly measure body fat, which can be difficult and expensive. What are BMI measurements used for? BMI is useful to: Find out if your weight puts you at higher risk for medical problems. Help recommend changes, such as in diet and exercise. This can help you reach a healthy weight. BMI screening can be done again to see if these changes are working. How is BMI calculated? Your height and weight are measured. The BMI is found from those numbers. This can be done with [place] or metric measurements. Note that charts and online BMI calculators are available to help you find your BMI quickly and easily without doing these calculations. To calculate your BMI in [place] measurements: Measure your weight in pounds (lb). Multiply the number of pounds by 703. So, for an adult who weighs 150 lb, multiply that number by 703: 150 x 703, which equals [phone]. Measure your height in inches. Then multiply that number by itself to get a measurement called inches squared. So, for an adult who is 70 inches tall, the inches squared measurement is 70 inches x 70 inches, which equals 4,900 inches squared. Divide the total from step 2 (number of lb x 703) by the total from step 3 (inches squared): 105,450  4,900 = 21.5. This is your BMI. To calculate your BMI in metric measurements:  Measure your weight in kilograms (kg). For this example, the weight is 70 kg. Measure your height in meters (m). Then multiply that number by itself to get a measurement called meters squared. So, for an adult who is 1.75 m tall, the meters squared measurement is 1.75 m x 1.75 m, which equals 3.1 meters squared. Divide the number of kilograms (your weight) by the meters squared number. In this example: 70  3.1 = 22.6. This is your BMI. What do the results mean? BMI charts are used to see if you are underweight, normal weight, overweight, or obese. The following guidelines will be used: Underweight: BMI less  than 18.5. Normal weight: BMI between 18.5 and 24.9. Overweight: BMI between 25 and 29.9. Obese: BMI of 30 or above. BMI is a tool and cannot diagnose a condition. Talk with your health care provider about what your BMI means for you. Keep these notes in mind: Weight includes fat and muscle. Someone with a muscular build, such as an athlete, may have a BMI that is higher than 24.9. In cases like these, BMI is not a correct measure of body fat. If you have a BMI of 25 or higher, your provider may need to do more testing to find out if excess body fat is the cause. BMI is measured the same way for males and females. Females usually have more body fat than males of the same height and weight. Where to find more information For more information about BMI, including tools to quickly find your BMI, go to: Centers for Disease Control and Prevention: tonerpromos.no American Heart Association: heart.org National Heart, Lung, and Blood Institute: buffalodrycleaner.gl This information is not intended to replace advice given to you by your health care provider. Make sure you discuss any questions you have with your health care provider. Document Revised: 12/20/2021 Document Reviewed: 12/13/2021 Elsevier Patient Education  2024 Elsevier Inc. Managing Your Hypertension Hypertension, also called high blood pressure, is when the force of the blood pressing against the walls of the arteries is too strong. Arteries are blood vessels that carry blood from your heart throughout  your body. Hypertension forces the heart to work harder to pump blood and may cause the arteries to become narrow or stiff. Understanding blood pressure readings A blood pressure reading includes a higher number over a lower number: The first, or top, number is called the systolic pressure. It is a measure of the pressure in your arteries as your heart beats. The second, or bottom number, is called the diastolic pressure. It is a measure of the pressure  in your arteries as the heart relaxes. For most people, a normal blood pressure is below 120/80. Your personal target blood pressure may vary depending on your medical conditions, your age, and other factors. Blood pressure is classified into four stages. Based on your blood pressure reading, your health care provider may use the following stages to determine what type of treatment you need, if any. Systolic pressure and diastolic pressure are measured in a unit called millimeters of mercury (mmHg). Normal Systolic pressure: below 120. Diastolic pressure: below 80. Elevated Systolic pressure: 120-129. Diastolic pressure: below 80. Hypertension stage 1 Systolic pressure: 130-139. Diastolic pressure: 80-89. Hypertension stage 2 Systolic pressure: 140 or above. Diastolic pressure: 90 or above. How can this condition affect me? Managing your hypertension is very important. Over time, hypertension can damage the arteries and decrease blood flow to parts of the body, including the brain, heart, and kidneys. Having untreated or uncontrolled hypertension can lead to: A heart attack. A stroke. A weakened blood vessel (aneurysm). Heart failure. Kidney damage. Eye damage. Memory and concentration problems. Vascular dementia. What actions can I take to manage this condition? Hypertension can be managed by making lifestyle changes and possibly by taking medicines. Your health care provider will help you make a plan to bring your blood pressure within a normal range. You may be referred for counseling on a healthy diet and physical activity. Nutrition  Eat a diet that is high in fiber and potassium, and low in salt (sodium), added sugar, and fat. An example eating plan is called the DASH diet. DASH stands for Dietary Approaches to Stop Hypertension. To eat this way: Eat plenty of fresh fruits and vegetables. Try to fill one-half of your plate at each meal with fruits and vegetables. Eat whole  grains, such as whole-wheat pasta, brown rice, or whole-grain bread. Fill about one-fourth of your plate with whole grains. Eat low-fat dairy products. Avoid fatty cuts of meat, processed or cured meats, and poultry with skin. Fill about one-fourth of your plate with lean proteins such as fish, chicken without skin, beans, eggs, and tofu. Avoid pre-made and processed foods. These tend to be higher in sodium, added sugar, and fat. Reduce your daily sodium intake. Many people with hypertension should eat less than 1,500 mg of sodium a day. Lifestyle  Work with your health care provider to maintain a healthy body weight or to lose weight. Ask what an ideal weight is for you. Get at least 30 minutes of exercise that causes your heart to beat faster (aerobic exercise) most days of the week. Activities may include walking, swimming, or biking. Include exercise to strengthen your muscles (resistance exercise), such as weight lifting, as part of your weekly exercise routine. Try to do these types of exercises for 30 minutes at least 3 days a week. Do not use any products that contain nicotine or tobacco. These products include cigarettes, chewing tobacco, and vaping devices, such as e-cigarettes. If you need help quitting, ask your health care provider. Control any long-term (chronic) conditions  you have, such as high cholesterol or diabetes. Identify your sources of stress and find ways to manage stress. This may include meditation, deep breathing, or making time for fun activities. Alcohol use Do not drink alcohol if: Your health care provider tells you not to drink. You are pregnant, may be pregnant, or are planning to become pregnant. If you drink alcohol: Limit how much you have to: 0-1 drink a day for women. 0-2 drinks a day for men. Know how much alcohol is in your drink. In the [place] one drink equals one 12 oz bottle of beer (355 mL), one 5 oz glass of wine (148 mL), or one 1 oz glass of hard  liquor (44 mL). Medicines Your health care provider may prescribe medicine if lifestyle changes are not enough to get your blood pressure under control and if: Your systolic blood pressure is 130 or higher. Your diastolic blood pressure is 80 or higher. Take medicines only as told by your health care provider. Follow the directions carefully. Blood pressure medicines must be taken as told by your health care provider. The medicine does not work as well when you skip doses. Skipping doses also puts you at risk for problems. Monitoring Before you monitor your blood pressure: Do not smoke, drink caffeinated beverages, or exercise within 30 minutes before taking a measurement. Use the bathroom and empty your bladder (urinate). Sit quietly for at least 5 minutes before taking measurements. Monitor your blood pressure at home as told by your health care provider. To do this: Sit with your back straight and supported. Place your feet flat on the floor. Do not cross your legs. Support your arm on a flat surface, such as a table. Make sure your upper arm is at heart level. Each time you measure, take two or three readings one minute apart and record the results. You may also need to have your blood pressure checked regularly by your health care provider. General information Talk with your health care provider about your diet, exercise habits, and other lifestyle factors that may be contributing to hypertension. Review all the medicines you take with your health care provider because there may be side effects or interactions. Keep all follow-up visits. Your health care provider can help you create and adjust your plan for managing your high blood pressure. Where to find more information National Heart, Lung, and Blood Institute: popsteam.is American Heart Association: www.heart.org Contact a health care provider if: You think you are having a reaction to medicines you have taken. You have  repeated (recurrent) headaches. You feel dizzy. You have swelling in your ankles. You have trouble with your vision. Get help right away if: You develop a severe headache or confusion. You have unusual weakness or numbness, or you feel faint. You have severe pain in your chest or abdomen. You vomit repeatedly. You have trouble breathing. These symptoms may be an emergency. Get help right away. Call 911. Do not wait to see if the symptoms will go away. Do not drive yourself to the hospital. Summary Hypertension is when the force of blood pumping through your arteries is too strong. If this condition is not controlled, it may put you at risk for serious complications. Your personal target blood pressure may vary depending on your medical conditions, your age, and other factors. For most people, a normal blood pressure is less than 120/80. Hypertension is managed by lifestyle changes, medicines, or both. Lifestyle changes to help manage hypertension include losing weight, eating a  healthy, low-sodium diet, exercising more, stopping smoking, and limiting alcohol. This information is not intended to replace advice given to you by your health care provider. Make sure you discuss any questions you have with your health care provider. Document Revised: 12/14/2020 Document Reviewed: 12/14/2020 Elsevier Patient Education  2024 Elsevier Inc. Cancer Screening for Males A cancer screening is a test or exam that checks for cancer. Work with your health care provider to create a cancer screening schedule that protects your health. Who should have screening? All people who are male should be considered for screening of certain cancers, including colorectal cancer, prostate cancer, lung cancer, and skin cancer. Your health care provider may recommend screenings for other types of cancer if: You have had cancer before. You have a family member with cancer. You have genes that could increase the risk of  cancer. You have risk factors for certain cancers, such as current or past use of tobacco products or being overweight. What are the benefits of screening? Cancer screening is done to look for cancer in the very early stages, before it spreads and becomes harder to treat and before you would start to notice symptoms. Finding cancer early improves the chances of successful treatment. It may save your life. When should I be screened for cancer? When you should be screened for cancer depends on: Your age. Your medical history and your family's medical history. Certain lifestyle factors, such as smoking or other use of tobacco products. Environmental exposure, such as to asbestos. How is screening done? Colorectal cancer Colorectal cancer screening looks for cancer or for growths called polyps that often form before cancer starts. Tests to look for cancer or polyps include: Colonoscopy or flexible sigmoidoscopy. For these procedures, a flexible tube with a small camera is inserted into the rectum. CT colonography. This test uses X-rays and a contrast dye to check the colon for polyps. Tests to look for cancer in the stool (feces) include: Guaiac-based fecal occult blood test (FOBT). This test can find blood in stool. It can be done at home with a kit. Fecal immunochemical test (FIT). This test can find blood in stool. For this test, you will need to collect stool samples at home. Stool DNA test. This test looks for blood in stool and any changes in DNA that can lead to colon cancer. For this test, you will need to collect a stool sample at home and send it to a lab. All adults should have screenings starting at 55 years old and continuing through 55 years old. For males 25-28 years old, the decision to be screened should be based on a person's preferences, life expectancy, overall health, and prior screening history. Your health care provider may recommend screening before 55 years old. You will have  tests every 1-10 years, depending on your results and the type of screening test. People at increased risk should start screening at an earlier age. Talk with your health care provider about which screening test is right for you and how often you should be screened. Prostate cancer Prostate cancer screening is done with blood tests and a digital rectal exam. During this exam, a health care provider uses a gloved finger to check prostate size. You may need to be screened for prostate cancer if: You have risk factors for prostate cancer, such as being an African American person or having a close family member with prostate cancer. You have had gene changes or a genetic condition that was passed on to you  from a parent (inherited). These gene changes or genetic conditions include BRCA1 or BRCA2 gene mutations or Lynch syndrome. You have symptoms of prostate cancer, such as problems urinating or problems getting or keeping an erection (erectile dysfunction). When you have been screened for prostate cancer, future screening may be recommended based on the results of your blood tests. Prostate cancer screening for males with average risk may start at 55 years old. Males with risk factors may need to be screened earlier, at 20-34 years old. Talk with your health care provider about whether screening is right for you and, if so, how often you should be screened. Lung cancer Lung cancer screening is done with a CT scan that looks for abnormal changes in the lungs. Discuss lung cancer screening with your health care provider if you are 71-44 years old and if any of the following apply to you: You currently smoke. You used to smoke heavily. You have a smoking history of 1 pack of cigarettes a day for 20 years or 2 packs a day for 10 years. You may need to be screened every year if you smoke heavily or if you used to smoke. Skin cancer Skin cancer screening is done by checking the skin for unusual moles or spots  and any changes in existing moles. Your health care provider should check your skin for signs of skin cancer at every physical exam. You should check your skin every month and tell your health care provider right away if anything looks unusual. Males with a higher-than-normal risk for skin cancer may want to see a skin specialist (dermatologist) for an annual body check. Where to find more information American Cancer Society: cancer.org Centers for Disease Control and Prevention: tonerpromos.no National Cancer Institute: cancer.gov Contact a health care provider if: You have concerns about any signs or symptoms of cancer. These may include: Skin problems. You may have: Moles of an unusual shape or color. Changes in existing moles. A sore on your skin that does not heal. Tiredness (fatigue) that does not go away. Losing weight without trying. Blood in your urine or stool. Problems with urination. You may have: Changes in urination habits. Painful urination. Painful ejaculation. Problems with coughing or breathing. These may include: Coughing or trouble breathing that does not go away. Coughing up blood. Frequent pain or cramping in your abdomen. This information is not intended to replace advice given to you by your health care provider. Make sure you discuss any questions you have with your health care provider. Document Revised: 04/09/2022 Document Reviewed: 10/22/2021 Elsevier Patient Education  2024 Elsevier Inc.  The above assessment and management plan was discussed with the patient. The patient verbalized understanding of and has agreed to the management plan. Patient is aware to call the clinic if they develop any new symptoms or if symptoms persist or worsen. Patient is aware when to return to the clinic for a follow-up visit. Patient educated on when it is appropriate to go to the emergency department.   Iverna Hammac [place] Thompson, DNP [facility] [address] [place], [place] [address] [phone]

## 2024-02-18 ENCOUNTER — Telehealth: Payer: Self-pay | Admitting: Nurse Practitioner

## 2024-02-18 ENCOUNTER — Encounter: Payer: Self-pay | Admitting: Nurse Practitioner

## 2024-02-18 ENCOUNTER — Ambulatory Visit: Payer: Self-pay | Admitting: Nurse Practitioner

## 2024-02-18 VITALS — BP 118/74 | HR 43 | Temp 97.4°F | Ht 74.0 in | Wt 255.2 lb

## 2024-02-18 DIAGNOSIS — G8929 Other chronic pain: Secondary | ICD-10-CM

## 2024-02-18 DIAGNOSIS — R351 Nocturia: Secondary | ICD-10-CM | POA: Diagnosis not present

## 2024-02-18 DIAGNOSIS — Z6832 Body mass index (BMI) 32.0-32.9, adult: Secondary | ICD-10-CM | POA: Diagnosis not present

## 2024-02-18 DIAGNOSIS — M545 Low back pain, unspecified: Secondary | ICD-10-CM

## 2024-02-18 DIAGNOSIS — I10 Essential (primary) hypertension: Secondary | ICD-10-CM

## 2024-02-18 DIAGNOSIS — Z794 Long term (current) use of insulin: Secondary | ICD-10-CM

## 2024-02-18 DIAGNOSIS — E66811 Obesity, class 1: Secondary | ICD-10-CM | POA: Diagnosis not present

## 2024-02-18 DIAGNOSIS — I5022 Chronic systolic (congestive) heart failure: Secondary | ICD-10-CM

## 2024-02-18 DIAGNOSIS — E1165 Type 2 diabetes mellitus with hyperglycemia: Secondary | ICD-10-CM

## 2024-02-18 DIAGNOSIS — Z0001 Encounter for general adult medical examination with abnormal findings: Secondary | ICD-10-CM | POA: Diagnosis not present

## 2024-02-18 DIAGNOSIS — Z0279 Encounter for issue of other medical certificate: Secondary | ICD-10-CM

## 2024-02-18 DIAGNOSIS — Z7984 Long term (current) use of oral hypoglycemic drugs: Secondary | ICD-10-CM

## 2024-02-18 DIAGNOSIS — Z1211 Encounter for screening for malignant neoplasm of colon: Secondary | ICD-10-CM

## 2024-02-18 LAB — BAYER DCA HB A1C WAIVED: HB A1C (BAYER DCA - WAIVED): 5.9 % — ABNORMAL HIGH (ref 4.8–5.6)

## 2024-02-18 MED ORDER — LIDOCAINE 5 % EX PTCH: 1.0000 | MEDICATED_PATCH | CUTANEOUS | 1 refills | Status: AC

## 2024-02-18 NOTE — Telephone Encounter (Signed)
 PT dropped off DISABILITY forms to be completed and signed.  Form Fee Paid? (YES            If NO, form is placed on front office manager desk to hold until payment received. If YES, then form will be placed in the RX/HH Nurse Coordinators box for completion.  Form will not be processed until payment is received

## 2024-02-19 ENCOUNTER — Ambulatory Visit: Payer: Self-pay | Admitting: Nurse Practitioner

## 2024-02-19 ENCOUNTER — Telehealth: Payer: Self-pay | Admitting: Pharmacy Technician

## 2024-02-19 ENCOUNTER — Other Ambulatory Visit (HOSPITAL_COMMUNITY): Payer: Self-pay

## 2024-02-19 DIAGNOSIS — E782 Mixed hyperlipidemia: Secondary | ICD-10-CM | POA: Insufficient documentation

## 2024-02-19 LAB — CBC WITH DIFFERENTIAL/PLATELET
Basophils Absolute: 0.1 x10E3/uL (ref 0.0–0.2)
Basos: 1 %
EOS (ABSOLUTE): 0.3 x10E3/uL (ref 0.0–0.4)
Eos: 3 %
Hematocrit: 58.1 % — ABNORMAL HIGH (ref 37.5–51.0)
Hemoglobin: 19.3 g/dL — ABNORMAL HIGH (ref 13.0–17.7)
Immature Grans (Abs): 0 x10E3/uL (ref 0.0–0.1)
Immature Granulocytes: 0 %
Lymphocytes Absolute: 3.1 x10E3/uL (ref 0.7–3.1)
Lymphs: 32 %
MCH: 30.1 pg (ref 26.6–33.0)
MCHC: 33.2 g/dL (ref 31.5–35.7)
MCV: 91 fL (ref 79–97)
Monocytes Absolute: 0.9 x10E3/uL (ref 0.1–0.9)
Monocytes: 9 %
Neutrophils Absolute: 5.5 x10E3/uL (ref 1.4–7.0)
Neutrophils: 55 %
Platelets: 177 x10E3/uL (ref 150–450)
RBC: 6.41 x10E6/uL — ABNORMAL HIGH (ref 4.14–5.80)
RDW: 14.5 % (ref 11.6–15.4)
WBC: 9.9 x10E3/uL (ref 3.4–10.8)

## 2024-02-19 LAB — LIPID PANEL
Chol/HDL Ratio: 4.2 ratio (ref 0.0–5.0)
Cholesterol, Total: 234 mg/dL — ABNORMAL HIGH (ref 100–199)
HDL: 56 mg/dL (ref >39)
LDL Chol Calc (NIH): 155 mg/dL — ABNORMAL HIGH (ref 0–99)
Triglycerides: 131 mg/dL (ref 0–149)
VLDL Cholesterol Cal: 23 mg/dL (ref 5–40)

## 2024-02-19 LAB — THYROID PANEL WITH TSH
Free Thyroxine Index: 2.2 (ref 1.2–4.9)
T3 Uptake Ratio: 27 % (ref 24–39)
T4, Total: 8.2 ug/dL (ref 4.5–12.0)
TSH: 3.6 u[IU]/mL (ref 0.450–4.500)

## 2024-02-19 LAB — CMP14+EGFR
ALT: 17 IU/L (ref 0–44)
AST: 13 IU/L (ref 0–40)
Albumin: 4.5 g/dL (ref 3.8–4.9)
Alkaline Phosphatase: 70 IU/L (ref 47–123)
BUN/Creatinine Ratio: 10 (ref 9–20)
BUN: 16 mg/dL (ref 6–24)
Bilirubin Total: 0.4 mg/dL (ref 0.0–1.2)
CO2: 22 mmol/L (ref 20–29)
Calcium: 9.8 mg/dL (ref 8.7–10.2)
Chloride: 99 mmol/L (ref 96–106)
Creatinine, Ser: 1.57 mg/dL — ABNORMAL HIGH (ref 0.76–1.27)
Globulin, Total: 3 g/dL (ref 1.5–4.5)
Glucose: 126 mg/dL — ABNORMAL HIGH (ref 70–99)
Potassium: 4.6 mmol/L (ref 3.5–5.2)
Sodium: 140 mmol/L (ref 134–144)
Total Protein: 7.5 g/dL (ref 6.0–8.5)
eGFR: 52 mL/min/1.73 — ABNORMAL LOW (ref >59)

## 2024-02-19 LAB — PSA, TOTAL AND FREE
PSA, Free Pct: 36.1 %
PSA, Free: 0.65 ng/mL
Prostate Specific Ag, Serum: 1.8 ng/mL (ref 0.0–4.0)

## 2024-02-19 MED ORDER — OMEGA 3 FISH OIL 1000 MG PO CAPS: 1.0000 | ORAL_CAPSULE | Freq: Every day | ORAL | 1 refills | Status: AC

## 2024-02-19 NOTE — Telephone Encounter (Signed)
 PCP completed and signed disability forms. They have been faxed to [employer] at fax number [phone]. Patient has been contacted and informed they are complete. Copy at front desk

## 2024-02-19 NOTE — Telephone Encounter (Signed)
 Pharmacy Patient Advocate Encounter   Received notification from [employer] that prior authorization for Lidocaine  5% patches is required/requested.   Insurance verification completed.   The patient is insured through [facility] & [employer].   Per test claim: PA required; PA submitted to above mentioned insurance via Latent Key/confirmation #/[id number] [facility] Status is pending

## 2024-02-19 NOTE — Telephone Encounter (Signed)
 Pharmacy Patient Advocate Encounter  Received notification from [facility] that Prior Authorization for Lidocaine  5% patches has been DENIED.  Full denial letter will be uploaded to the media tab. See denial reason below.     PA #/Case ID/Reference #: [id number]

## 2024-02-23 ENCOUNTER — Telehealth (HOSPITAL_COMMUNITY): Payer: Self-pay

## 2024-02-23 NOTE — Progress Notes (Signed)
 Advanced Heart Failure Clinic Note    PCP: Deitra Morton Sebastian Nena, NP HF Cardiologist: Dr. Cherrie   Reason for Visit: F/u for chronic systolic heart failure   HPI: Mr Milhorn is a 53 y.o. AAM w/ h/o COVID 19 infection 03/2020, HTN, chronic renal insuffiency and chronic systolic HF.  Presented to [facility] on 3/22 with increased dyspnea and edema. Dyspnea started shortly after being diagnosed w/ COVID. Echo showed severely reduced EF 20-25% and RV mildly reduced. Started on IV lasix . Transferred to [facility] on 4/22. R/LHC showed normal coronaries, severe NICM EF 15% w/ volume overload and preserved CO. GDMT further titrated. cMRI LVEF < 25% and moderately reduced RV. +inferior RV insertion site LGE (nonspecific), ECV % not significantly elevated and T2 signal not high. Findings c/w NICM.   At his follow up 4/22, he did not start his metoprolol  due to cost. AHF follow up 6/22 he had been out of meds for a week due to cost.  Echo 12/22 EF 40-45%.   Never f/u'd in HF Clinic.   Admitted 1/25 with ADHF in setting of Flu. EF 30-35%. Underwent RHC RA 4 PA 28/14 (21) PCWP 14 Fick 5.7/2.4 TD 4.5/1.9.  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  Echo 6/25 EF 30-35%, RV normal.   He returns today for heart failure follow up. He denies resting dyspnea but reports NYHA Class IIb-III symptoms. Not checking wt daily. Has abdominal distention. No LEE. He reports full med compliance but says UOP w/ lasix  is not robust. I reviewed med list and he has been taking Losartan  and Entresto  together. He was instructed at last clinic visit to stop losartan . He did have labs done at PCP office last wk and SCr was stable at 1.6 c/w baseline, K was 4.6. LDL was also elevated at 155. TG 234. PCP added Fish oil. Not started on statin.   ReDs is elevated at 43% today.   BP 152/80.   He has yet to schedule sleep study.     Cardiac Studies   - Echo 6/25: EF 30-35%, G1DD,  nl RV - RHC 1/25: RA 4, PA 28/14 (21), PCWP 14, Fick 5.7/2.4, TD 4.5/1.9 - Echo 1/25: EF 30-35% - Echo 12/22: EF 40-45% - Echo 4/22: EF 20-25% w/ global HK, mild LVH, RV moderately enlarged and systolic function mildly reduced. - R/LHC 4/22: RA 11, PA 42/17 (26), PCW 14, PVR 1.5 WU, SVR 791, CO/CI fick 7.9/3.4 1. Normal coronary arteries 2. Severe NICM EF 15% 3. Volume overload with preserved CO - cMRI 4/22 1. Moderately dilated LV with mild LVH, EF 23%.  2. Moderately dilated RV with EF 21%.  3. Nonspecific inferior RV insertion site LGE suggestive of pressure/volume overload.  4. Extracellular volume percentage not significantly elevated and T2 signal not high.  SH:  Social History   Socioeconomic History   Marital status: Single    Spouse name: Not on file   Number of children: Not on file   Years of education: Not on file   Highest education level: Not on file  Occupational History   Not on file  Tobacco Use   Smoking status: Never   Smokeless tobacco: Never  Vaping Use   Vaping status: Never Used  Substance and Sexual Activity   Alcohol use: Never   Drug use: Never   Sexual activity: Not on file  Other Topics Concern   Not on file  Social History Narrative  Not on file   Social Drivers of Health   Financial Resource Strain: Medium Risk (08/18/2023)   Overall Financial Resource Strain (CARDIA)    Difficulty of Paying Living Expenses: Somewhat hard  Food Insecurity: No Food Insecurity (04/26/2023)   Hunger Vital Sign    Worried About Running Out of Food in the Last Year: Never true    Ran Out of Food in the Last Year: Never true  Transportation Needs: No Transportation Needs (04/26/2023)   PRAPARE - Administrator, Civil Service (Medical): No    Lack of Transportation (Non-Medical): No  Physical Activity: Not on file  Stress: Not on file  Social Connections: Not on file  Intimate Partner Violence: Not At Risk (04/26/2023)   Humiliation, Afraid,  Rape, and Kick questionnaire    Fear of Current or Ex-Partner: No    Emotionally Abused: No    Physically Abused: No    Sexually Abused: No   FH:  Family History  Problem Relation Age of Onset   Heart failure Neg Hx    Past Medical History:  Diagnosis Date   Acute systolic heart failure (HCC) 07/18/2020   AKI (acute kidney injury) 03/15/2020   Chronic kidney disease    Chronic systolic heart failure (HCC) 11/09/2020   Elevated hemoglobin A1c measurement 04/26/2023   6.7% in 2022     Essential hypertension 03/15/2020   Nonischemic cardiomyopathy (HCC) 04/26/2023   Pneumonia due to COVID-19 virus 03/14/2020   Current Outpatient Medications  Medication Sig Dispense Refill   atorvastatin (LIPITOR) 40 MG tablet Take 1 tablet (40 mg total) by mouth daily. 90 tablet 3   dapagliflozin  propanediol (FARXIGA ) 10 MG TABS tablet Take 1 tablet (10 mg total) by mouth daily. 30 tablet 1   metoprolol  succinate (TOPROL -XL) 25 MG 24 hr tablet Take 1 tablet (25 mg total) by mouth at bedtime. 30 tablet 6   potassium chloride  SA (KLOR-CON  M) 20 MEQ tablet Take 1 tablet (20 mEq total) by mouth daily. 30 tablet 5   sacubitril -valsartan  (ENTRESTO ) 49-51 MG Take 1 tablet by mouth 2 (two) times daily. 60 tablet 11   spironolactone  (ALDACTONE ) 25 MG tablet Take 1 tablet (25 mg total) by mouth daily. 30 tablet 6   torsemide (DEMADEX) 20 MG tablet Take 1 tablet (20 mg total) by mouth daily. 90 tablet 3   lidocaine  (LIDODERM ) 5 % Place 1 patch onto the skin daily. Remove & Discard patch within 12 hours or as directed by MD (Patient not taking: Reported on 02/24/2024) 30 patch 1   Omega-3 Fatty Acids (OMEGA 3 FISH OIL) 1000 MG CAPS Take 1 capsule by mouth daily. (Patient not taking: Reported on 02/24/2024) 90 capsule 1   No current facility-administered medications for this encounter.   BP (!) 152/80   Pulse 71   Ht 6' 3 (1.905 m)   Wt 118.5 kg (261 lb 3.2 oz)   SpO2 96%   BMI 32.65 kg/m   Wt Readings  from Last 3 Encounters:  02/24/24 118.5 kg (261 lb 3.2 oz)  02/18/24 115.8 kg (255 lb 3.2 oz)  09/16/23 122.4 kg (269 lb 12.8 oz)   PHYSICAL EXAM: General: Well appearing. No distress on RA Cardiac: thick neck JVP not well visualized. S1 and S2 present. No murmurs or rub. Resp: Lung sounds clear and equal B/L Abdomen:  distended. Non tender  Extremities: Warm and dry.  No LEE edema.  Neuro: Alert and oriented x3. Affect pleasant. Moves all extremities without difficulty.  ReDs reading: 43%, abnormal   ASSESSMENT & PLAN: 1. Chronic Systolic Heart Failure  - Echo 4/22: EF 20-25%, global HK, RV mildly reduced  - Cath 4/22: EF 15% normal cors. Preserved output. LVEDP 22 - cMRI 4/22: EF < 25%, RV moderately reduced. nonspecific inferior RV insertion site LGE. ECV and T2 values WNL.  - Echo 12/22: EF 40-45% -> lost to f/u. Off meds.  - Echo 1/25: EF 30-35%, RV mildly reduced  - RHC 1/25: RA 4, PAP 28/14 mmHg, PCWP 14, CO/CI 5.7/2.4 (Fick) - Echo 6/25: EF 30-35%, G1DD, nl RV - NYHA IIb-III. Volume overloaded on exam and ReDs 43%. + abdominal edema w/ subpar response to PO Lasix  - Stop Lasix  - Start Torsemide 20 mg daily  - Stop losartan  - Increase Entresto  to 49-51 mg bid (I personally reviewed BMP from last wk, SCr/K stable)  - Continue Toprol  XL 25 mg daily.  - Continue Farxiga  10 mg daily. - Continue spiro 25 mg daily. - Repeat BMP in 1 wk   2. PVCs - Zio showed 10% PVC burden - ? If contributing to CM - Continue Toprol  - Sleep study scheduled twice, has not been completed. Reiterated importance of completing. He will plan to reschedule  - May need PVC ablation, but has 2 morphologies   3. CKD IIIa-b  - suspect hypertensive nephropathy - Baseline SCr 1.4-1.8 - Continue SGLT2i  - recent CMP stable. Needs f/u BMP in 1 wk given above med changes   4. Morbid obesity  - Body mass index is 32.65 kg/m. - Consider GLP1RA in near future    5. HTN  - mod elevated but hasn't  taken am meds yet  - HF GDMT per above, increasing Entresto     6. Snoring - Now insured, needs to complete sleep study   7. SDOH: I do worry some regarding pt's ability to understand and manage his medications. He is [facility] and lives in [place]. I offered assistance from our paramedicine program but he assures me he can manage. I reviewed planned med changes and instructed him to follow AVS instructions when he goes home.    Follow up in 1-2 wks w/ APP to reassess volume status and further up titrate GDMT.   Caffie Shed, PA-C  02/24/24

## 2024-02-23 NOTE — Telephone Encounter (Addendum)
 Called to confirm/remind patient of their appointment at the Advanced Heart Failure Clinic on 02/24/24 8:30.   Appointment:   [] Confirmed  [x] Left mess   [] No answer/No voice mail  [] VM Full/unable to leave message  [] Phone not in service  Patient reminded to bring all medications and/or complete list.  Confirmed patient has transportation. Gave directions, instructed to utilize valet parking.

## 2024-02-24 ENCOUNTER — Encounter (HOSPITAL_COMMUNITY): Payer: Self-pay

## 2024-02-24 ENCOUNTER — Ambulatory Visit (HOSPITAL_COMMUNITY)
Admission: RE | Admit: 2024-02-24 | Discharge: 2024-02-24 | Disposition: A | Discharge status: Home / Self Care | Source: Ambulatory Visit | Attending: Cardiology | Admitting: Cardiology

## 2024-02-24 ENCOUNTER — Telehealth: Payer: Self-pay | Admitting: Nurse Practitioner

## 2024-02-24 VITALS — BP 152/80 | HR 71 | Ht 75.0 in | Wt 261.2 lb

## 2024-02-24 DIAGNOSIS — I130 Hypertensive heart and chronic kidney disease with heart failure and stage 1 through stage 4 chronic kidney disease, or unspecified chronic kidney disease: Secondary | ICD-10-CM | POA: Diagnosis not present

## 2024-02-24 DIAGNOSIS — I5022 Chronic systolic (congestive) heart failure: Secondary | ICD-10-CM | POA: Insufficient documentation

## 2024-02-24 DIAGNOSIS — I428 Other cardiomyopathies: Secondary | ICD-10-CM | POA: Diagnosis not present

## 2024-02-24 DIAGNOSIS — Z8616 Personal history of COVID-19: Secondary | ICD-10-CM | POA: Insufficient documentation

## 2024-02-24 DIAGNOSIS — Z6832 Body mass index (BMI) 32.0-32.9, adult: Secondary | ICD-10-CM | POA: Insufficient documentation

## 2024-02-24 DIAGNOSIS — Z79899 Other long term (current) drug therapy: Secondary | ICD-10-CM | POA: Diagnosis not present

## 2024-02-24 DIAGNOSIS — Z59868 Other specified financial insecurity: Secondary | ICD-10-CM | POA: Diagnosis not present

## 2024-02-24 DIAGNOSIS — R140 Abdominal distension (gaseous): Secondary | ICD-10-CM | POA: Diagnosis not present

## 2024-02-24 DIAGNOSIS — I493 Ventricular premature depolarization: Secondary | ICD-10-CM | POA: Insufficient documentation

## 2024-02-24 DIAGNOSIS — N1832 Chronic kidney disease, stage 3b: Secondary | ICD-10-CM | POA: Diagnosis not present

## 2024-02-24 DIAGNOSIS — E8770 Fluid overload, unspecified: Secondary | ICD-10-CM | POA: Diagnosis not present

## 2024-02-24 DIAGNOSIS — R0683 Snoring: Secondary | ICD-10-CM | POA: Insufficient documentation

## 2024-02-24 MED ORDER — SACUBITRIL-VALSARTAN 49-51 MG PO TABS: 1.0000 | ORAL_TABLET | Freq: Two times a day (BID) | ORAL | 11 refills | Status: DC

## 2024-02-24 MED ORDER — ATORVASTATIN CALCIUM 40 MG PO TABS: 40.0000 mg | ORAL_TABLET | Freq: Every day | ORAL | 3 refills | Status: AC

## 2024-02-24 MED ORDER — TORSEMIDE 20 MG PO TABS
20.0000 mg | ORAL_TABLET | Freq: Every day | ORAL | 3 refills | Status: AC
Start: 2024-02-24 — End: ?

## 2024-02-24 NOTE — Progress Notes (Signed)
 ReDS Vest / Clip - 02/24/24 9166       ReDS Vest / Clip   Station Marker D    Ruler Value 36    ReDS Value Range High volume overload    ReDS Actual Value 43

## 2024-02-24 NOTE — Telephone Encounter (Signed)
 Pt aware and verbalized understanding.

## 2024-02-24 NOTE — Patient Instructions (Signed)
 STOP Losartan   STOP Lasix .  START Lipitor 40 mg daily.  START Torsemide to 20 mg daily.  CHANGE Entresto  to 49/51 mg Twice daily   Your physician recommends that you schedule a follow-up appointment as scheduled.  If you have any questions or concerns before your next appointment please send us  a message through [place] or call our office at [phone].    TO LEAVE A MESSAGE FOR THE NURSE SELECT OPTION 2, PLEASE LEAVE A MESSAGE INCLUDING: YOUR NAME DATE OF BIRTH CALL BACK NUMBER REASON FOR CALL**this is important as we prioritize the call backs  YOU WILL RECEIVE A CALL BACK THE SAME DAY AS LONG AS YOU CALL BEFORE 4:00 PM  At the Advanced [facility] Failure Clinic, you and your health needs are our priority. As part of our continuing mission to provide you with exceptional heart care, we have created designated Provider Care Teams. These Care Teams include your primary Cardiologist (physician) and Advanced Practice Providers (APPs- Physician Assistants and Nurse Practitioners) who all work together to provide you with the care you need, when you need it.   You may see any of the following providers on your designated Care Team at your next follow up: Dr Toribio Fuel Dr Ezra Shuck Dr. Morene Brownie Greig Mosses, NP Caffie Shed, [place] [facility] [place], [place] Beckey Coe, NP Jordan Lee, NP Ellouise Class, NP Tinnie Redman, PharmD Jaun Bash, PharmD   Please be sure to bring in all your medications bottles to every appointment.    Thank you for choosing [facility]

## 2024-02-24 NOTE — Telephone Encounter (Signed)
 Copied from CRM [id number]. Topic: Clinical - Medication Question >> Feb 24, 2024  1:35 PM Larissa S wrote: Reason for CRM: Patient states his insurance provider denied prescriptions for Omega-3 Fatty Acids (OMEGA 3 FISH OIL) 1000 MG CAPS and lidocaine  (LIDODERM ) 5 %. Requesting a callback to discuss next steps and alternative medications.

## 2024-02-25 ENCOUNTER — Other Ambulatory Visit (HOSPITAL_COMMUNITY): Payer: Self-pay

## 2024-03-09 ENCOUNTER — Ambulatory Visit (HOSPITAL_COMMUNITY)
Admission: RE | Admit: 2024-03-09 | Discharge: 2024-03-09 | Disposition: A | Discharge status: Home / Self Care | Source: Ambulatory Visit | Attending: Family Medicine | Admitting: Family Medicine

## 2024-03-09 ENCOUNTER — Ambulatory Visit (HOSPITAL_COMMUNITY): Payer: Self-pay | Admitting: Family Medicine

## 2024-03-09 ENCOUNTER — Encounter (HOSPITAL_COMMUNITY): Payer: Self-pay

## 2024-03-09 VITALS — BP 131/67 | HR 41 | Ht 74.0 in | Wt 257.0 lb

## 2024-03-09 DIAGNOSIS — Z7984 Long term (current) use of oral hypoglycemic drugs: Secondary | ICD-10-CM | POA: Insufficient documentation

## 2024-03-09 DIAGNOSIS — R42 Dizziness and giddiness: Secondary | ICD-10-CM | POA: Insufficient documentation

## 2024-03-09 DIAGNOSIS — Z8616 Personal history of COVID-19: Secondary | ICD-10-CM | POA: Diagnosis not present

## 2024-03-09 DIAGNOSIS — Z6833 Body mass index (BMI) 33.0-33.9, adult: Secondary | ICD-10-CM | POA: Insufficient documentation

## 2024-03-09 DIAGNOSIS — E669 Obesity, unspecified: Secondary | ICD-10-CM | POA: Insufficient documentation

## 2024-03-09 DIAGNOSIS — N183 Chronic kidney disease, stage 3 unspecified: Secondary | ICD-10-CM

## 2024-03-09 DIAGNOSIS — R002 Palpitations: Secondary | ICD-10-CM | POA: Diagnosis not present

## 2024-03-09 DIAGNOSIS — R1900 Intra-abdominal and pelvic swelling, mass and lump, unspecified site: Secondary | ICD-10-CM | POA: Diagnosis not present

## 2024-03-09 DIAGNOSIS — I5022 Chronic systolic (congestive) heart failure: Secondary | ICD-10-CM | POA: Diagnosis not present

## 2024-03-09 DIAGNOSIS — I428 Other cardiomyopathies: Secondary | ICD-10-CM | POA: Diagnosis not present

## 2024-03-09 DIAGNOSIS — I10 Essential (primary) hypertension: Secondary | ICD-10-CM | POA: Diagnosis not present

## 2024-03-09 DIAGNOSIS — I130 Hypertensive heart and chronic kidney disease with heart failure and stage 1 through stage 4 chronic kidney disease, or unspecified chronic kidney disease: Secondary | ICD-10-CM | POA: Diagnosis not present

## 2024-03-09 DIAGNOSIS — Z59868 Other specified financial insecurity: Secondary | ICD-10-CM | POA: Diagnosis not present

## 2024-03-09 DIAGNOSIS — E8770 Fluid overload, unspecified: Secondary | ICD-10-CM | POA: Diagnosis not present

## 2024-03-09 DIAGNOSIS — Z91148 Patient's other noncompliance with medication regimen for other reason: Secondary | ICD-10-CM | POA: Diagnosis not present

## 2024-03-09 DIAGNOSIS — N1831 Chronic kidney disease, stage 3a: Secondary | ICD-10-CM | POA: Insufficient documentation

## 2024-03-09 DIAGNOSIS — I471 Supraventricular tachycardia, unspecified: Secondary | ICD-10-CM | POA: Diagnosis not present

## 2024-03-09 DIAGNOSIS — Z79899 Other long term (current) drug therapy: Secondary | ICD-10-CM | POA: Insufficient documentation

## 2024-03-09 DIAGNOSIS — R0683 Snoring: Secondary | ICD-10-CM | POA: Diagnosis not present

## 2024-03-09 DIAGNOSIS — I493 Ventricular premature depolarization: Secondary | ICD-10-CM | POA: Insufficient documentation

## 2024-03-09 DIAGNOSIS — I5020 Unspecified systolic (congestive) heart failure: Secondary | ICD-10-CM | POA: Insufficient documentation

## 2024-03-09 DIAGNOSIS — R5383 Other fatigue: Secondary | ICD-10-CM | POA: Insufficient documentation

## 2024-03-09 LAB — BASIC METABOLIC PANEL WITH GFR
Anion gap: 13 (ref 5–15)
BUN: 24 mg/dL — ABNORMAL HIGH (ref 6–20)
CO2: 27 mmol/L (ref 22–32)
Calcium: 9.5 mg/dL (ref 8.9–10.3)
Chloride: 101 mmol/L (ref 98–111)
Creatinine, Ser: 1.84 mg/dL — ABNORMAL HIGH (ref 0.61–1.24)
GFR, Estimated: 43 mL/min — ABNORMAL LOW (ref >60)
Glucose, Bld: 122 mg/dL — ABNORMAL HIGH (ref 70–99)
Potassium: 4.8 mmol/L (ref 3.5–5.1)
Sodium: 141 mmol/L (ref 135–145)

## 2024-03-09 LAB — BRAIN NATRIURETIC PEPTIDE: B Natriuretic Peptide: 61.5 pg/mL (ref 0.0–100.0)

## 2024-03-09 MED ORDER — METOPROLOL SUCCINATE ER 50 MG PO TB24: 50.0000 mg | ORAL_TABLET | Freq: Every day | ORAL | 3 refills | Status: AC

## 2024-03-09 NOTE — Patient Instructions (Addendum)
 Thank you for coming in today  If you had labs drawn today, any labs that are abnormal the clinic will call you No news is good news  Medications: Increase Toprol  XL 50 mg every night  Follow up appointments:  Your physician recommends that you schedule a follow-up appointment in:  3 months With Dr. Cherrie Please call our office to schedule the follow-up appointment in December 2025 for February 2026.    Do the following things EVERYDAY: Weigh yourself in the morning before breakfast. Write it down and keep it in a log. Take your medicines as prescribed Eat low salt foods--Limit salt (sodium) to 2000 mg per day.  Stay as active as you can everyday Limit all fluids for the day to less than 2 liters   At the [facility], you and your health needs are our priority. As part of our continuing mission to provide you with exceptional heart care, we have created designated Provider Care Teams. These Care Teams include your primary Cardiologist (physician) and Advanced Practice Providers (APPs- Physician Assistants and Nurse Practitioners) who all work together to provide you with the care you need, when you need it.   You may see any of the following providers on your designated Care Team at your next follow up: Dr Toribio Cherrie Dr Ezra Shuck Dr. Ria Gardenia Greig Lenetta, NP Caffie Shed, [place] [facility] [place], [place] Beckey Coe, NP Tinnie Redman, PharmD   Please be sure to bring in all your medications bottles to every appointment.    Thank you for choosing [facility]  If you have any questions or concerns before your next appointment please send us  a message through [place] or call our office at [phone].    TO LEAVE A MESSAGE FOR THE NURSE SELECT OPTION 2, PLEASE LEAVE A MESSAGE INCLUDING: YOUR NAME DATE OF BIRTH CALL BACK NUMBER REASON FOR CALL**this is important as we prioritize the call  backs  YOU WILL RECEIVE A CALL BACK THE SAME DAY AS LONG AS YOU CALL BEFORE 4:00 PM

## 2024-03-09 NOTE — Progress Notes (Addendum)
 Advanced Heart Failure Clinic Note    PCP: Deitra Morton Sebastian Nena, NP HF Cardiologist: Dr. Cherrie   HPI: Tony Meyer is a 53 y.o. AAM w/ h/o COVID 19 infection 03/2020, HTN, chronic renal insuffiency and chronic systolic HF.  Admitted 3/22 with COVID and new systolic HF. Echo showed severely reduced EF 20-25% and RV mildly reduced. Started on IV lasix . R/LHC showed normal coronaries, severe NICM EF 15% w/ volume overload and preserved CO. cMRI LVEF < 25% and moderately reduced RV. +inferior RV insertion site LGE (nonspecific), ECV % not significantly elevated and T2 signal not high. Findings c/w NICM.   At his follow up 4/22, he did not start his metoprolol  due to cost. AHF follow up 6/22 he had been out of meds for a week due to cost.  Echo 12/22 EF 40-45%.   Lost to follow up.  Admitted 1/25 with ADHF in setting of Flu. Echo showed EF 30-35%. Underwent RHC showing RA 4 PA 28/14 (21) PCWP 14 Fick 5.7/2.4 TD 4.5/1.9.  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  Echo 6/25 EF 30-35%, RV normal.   Today he returns for HF follow up. Overall feeling air. Had chest pain x 3 days after starting torsemide , symptoms resolved. He has SOB walking up steps or walking further distances on flat ground. Feels palpitations and swelling in abdomen. He is fatigued. Occasional positional dizziness, no falls or syncope. Denies abnormal bleeding, dizziness, or PND/Orthopnea. Appetite ok.  Not weighing at home. Taking all medications. No ETOH, tobacco or drug use.   Cardiac Studies  - Echo 6/25: EF 30-35%, G1DD, nl RV - RHC 1/25: RA 4, PA 28/14 (21), PCWP 14, Fick 5.7/2.4, TD 4.5/1.9 - Echo 1/25: EF 30-35% - Echo 12/22: EF 40-45% - Echo 4/22: EF 20-25% w/ global HK, mild LVH, RV moderately enlarged and systolic function mildly reduced. - R/LHC 4/22: RA 11, PA 42/17 (26), PCW 14, PVR 1.5 WU, SVR 791, CO/CI fick 7.9/3.4 1. Normal coronary arteries 2.  Severe NICM EF 15% 3. Volume overload with preserved CO - cMRI 4/22 1. Moderately dilated LV with mild LVH, EF 23%.  2. Moderately dilated RV with EF 21%.  3. Nonspecific inferior RV insertion site LGE suggestive of pressure/volume overload.  4. Extracellular volume percentage not significantly elevated and T2 signal not high.  SH:  Social History   Socioeconomic History   Marital status: Single    Spouse name: Not on file   Number of children: Not on file   Years of education: Not on file   Highest education level: Not on file  Occupational History   Not on file  Tobacco Use   Smoking status: Never   Smokeless tobacco: Never  Vaping Use   Vaping status: Never Used  Substance and Sexual Activity   Alcohol use: Never   Drug use: Never   Sexual activity: Not on file  Other Topics Concern   Not on file  Social History Narrative   Not on file   Social Drivers of Health   Financial Resource Strain: Medium Risk (08/18/2023)   Overall Financial Resource Strain (CARDIA)    Difficulty of Paying Living Expenses: Somewhat hard  Food Insecurity: No Food Insecurity (04/26/2023)   Hunger Vital Sign    Worried About Running Out of Food in the Last Year: Never true    Ran Out of Food in the Last Year: Never true  Transportation Needs: No  Transportation Needs (04/26/2023)   PRAPARE - Administrator, Civil Service (Medical): No    Lack of Transportation (Non-Medical): No  Physical Activity: Not on file  Stress: Not on file  Social Connections: Not on file  Intimate Partner Violence: Not At Risk (04/26/2023)   Humiliation, Afraid, Rape, and Kick questionnaire    Fear of Current or Ex-Partner: No    Emotionally Abused: No    Physically Abused: No    Sexually Abused: No   FH:  Family History  Problem Relation Age of Onset   Heart failure Neg Hx    Past Medical History:  Diagnosis Date   Acute systolic heart failure (HCC) 07/18/2020   AKI (acute kidney injury)  03/15/2020   Chronic kidney disease    Chronic systolic heart failure (HCC) 11/09/2020   Elevated hemoglobin A1c measurement 04/26/2023   6.7% in 2022     Essential hypertension 03/15/2020   Nonischemic cardiomyopathy (HCC) 04/26/2023   Pneumonia due to COVID-19 virus 03/14/2020   Current Outpatient Medications  Medication Sig Dispense Refill   atorvastatin  (LIPITOR) 40 MG tablet Take 1 tablet (40 mg total) by mouth daily. 90 tablet 3   dapagliflozin  propanediol (FARXIGA ) 10 MG TABS tablet Take 1 tablet (10 mg total) by mouth daily. 30 tablet 1   lidocaine  (LIDODERM ) 5 % Place 1 patch onto the skin daily. Remove & Discard patch within 12 hours or as directed by MD 30 patch 1   metoprolol  succinate (TOPROL -XL) 25 MG 24 hr tablet Take 1 tablet (25 mg total) by mouth at bedtime. 30 tablet 6   Omega-3 Fatty Acids (OMEGA 3 FISH OIL ) 1000 MG CAPS Take 1 capsule by mouth daily. 90 capsule 1   potassium chloride  SA (KLOR-CON  Tony) 20 MEQ tablet Take 1 tablet (20 mEq total) by mouth daily. 30 tablet 5   sacubitril -valsartan  (ENTRESTO ) 24-26 MG Take 1 tablet by mouth.     spironolactone  (ALDACTONE ) 25 MG tablet Take 1 tablet (25 mg total) by mouth daily. 30 tablet 6   torsemide  (DEMADEX ) 20 MG tablet Take 1 tablet (20 mg total) by mouth daily. 90 tablet 3   No current facility-administered medications for this encounter.   BP 131/67   Pulse (!) 41   Ht 6' 2 (1.88 Tony)   Wt 116.6 kg (257 lb)   SpO2 97%   BMI 33.00 kg/Tony   Wt Readings from Last 3 Encounters:  03/09/24 116.6 kg (257 lb)  02/24/24 118.5 kg (261 lb 3.2 oz)  02/18/24 115.8 kg (255 lb 3.2 oz)   PHYSICAL EXAM: General:  NAD. No resp difficulty, walked into clinic HEENT: Normal Neck: Supple. No JVD. Cor: Regular rate & rhythm. No rubs, gallops or murmurs. Lungs: Clear Abdomen: Soft, nontender, nondistended.  Extremities: No cyanosis, clubbing, rash, edema Neuro: Alert & oriented x 3, moves all 4 extremities w/o difficulty. Affect  pleasant.  ECG (personally reviewed): SR with frequent PVCs  ReDs reading: 36%, abnormal   ASSESSMENT & PLAN: 1. Chronic Systolic Heart Failure  - Echo 4/22: EF 20-25%, global HK, RV mildly reduced  - Cath 4/22: EF 15% normal cors. Preserved output. LVEDP 22 - cMRI 4/22: EF < 25%, RV moderately reduced. nonspecific inferior RV insertion site LGE. ECV and T2 values WNL.  - Echo 12/22: EF 40-45% -> lost to f/u. Off meds.  - Echo 1/25: EF 30-35%, RV mildly reduced  - RHC 1/25: RA 4, PAP 28/14 mmHg, PCWP 14, CO/CI 5.7/2.4 (Fick) - Echo  6/25: EF 30-35%, G1DD, nl RV - NYHA IIb-III. Volume improving, ReDs 36% - Increase Toprol  XL to 50 mg qhs - Continue torsemide  20 mg daily.  - Continue Entresto  49/51 mg bid - Continue Toprol  XL 25 mg daily.  - Continue Farxiga  10 mg daily. - Continue spiro 25 mg daily. - Labs today - Qualifies for ICD. He is interested in getting more info. Will refer to EP to discuss. QRS narrow.  2. PVCs - Zio showed 10% PVC burden - ? If contributing to CM - Increase Toprol  as above - Would like to avoid amio with young age. - May need PVC ablation, but has 2 morphologies. Refer to EP to discuss - Consider repeating echo after adequate PVC suppression, if EF remains < 35%, recommend ICD. We discussed this today as potential option. Will need EP help with management.   3. CKD IIIa-b  - suspect hypertensive nephropathy - Baseline SCr 1.4-1.8 - Continue SGLT2i  - Labs today.  4. Obesity  - Body mass index is 33 kg/Tony. - Consider GLP1RA in near future   5. HTN  - BP stable, has not had meds yet today - Meds as above  6. Snoring - Now insured, refer to Pulmonary for sleep study.  Follow up in 3 months with Dr. Bensimhon  Tony Meyer Tony Myria Steenbergen, FNP  03/09/24

## 2024-03-09 NOTE — Progress Notes (Signed)
 ReDS Vest / Clip - 03/09/24 0825       ReDS Vest / Clip   Station Marker D    Ruler Value 37    ReDS Value Range Moderate volume overload    ReDS Actual Value 36

## 2024-03-23 ENCOUNTER — Ambulatory Visit

## 2024-03-23 VITALS — BP 163/92 | HR 74 | Temp 98.3°F | Ht 74.0 in | Wt 264.0 lb

## 2024-03-23 DIAGNOSIS — I10 Essential (primary) hypertension: Secondary | ICD-10-CM | POA: Diagnosis not present

## 2024-03-23 DIAGNOSIS — E66811 Obesity, class 1: Secondary | ICD-10-CM | POA: Diagnosis not present

## 2024-03-23 DIAGNOSIS — G4719 Other hypersomnia: Secondary | ICD-10-CM

## 2024-03-23 DIAGNOSIS — I5020 Unspecified systolic (congestive) heart failure: Secondary | ICD-10-CM | POA: Diagnosis not present

## 2024-03-23 NOTE — Patient Instructions (Signed)
  VISIT SUMMARY: You came in today for an evaluation of sleep apnea, as referred by your cardiologist. You have been experiencing difficulty with sleep, including trouble falling asleep, waking up multiple times during the night, and daytime fatigue. We discussed the potential impact of sleep apnea on your cardiovascular health and blood pressure.  YOUR PLAN: -SLEEP APNEA EVALUATION: Sleep apnea is a condition where your breathing repeatedly stops and starts during sleep. This can lead to daytime fatigue and other health issues. We have ordered a sleep study to confirm if you have sleep apnea. If confirmed, we will discuss CPAP therapy, which is a treatment that uses mild air pressure to keep your airways open while you sleep. You have been provided with instructions on how to use the sleep study device.  INSTRUCTIONS: Please follow the instructions provided for the sleep study device and complete the study as soon as possible. We will review the results and discuss the next steps, including the possibility of CPAP therapy, if sleep apnea is confirmed.                      Contains text generated by Abridge.                                 Contains text generated by Abridge.

## 2024-03-23 NOTE — Progress Notes (Signed)
 Pulmonology Office Visit   Subjective:  Patient ID: Tony Meyer, male    DOB: 1970/11/26  MRN: [id number]  Referred by: Glena Harlene HERO, FNP  CC:  Chief Complaint  Patient presents with   Consult    No prior sleep study. C/o restless sleep    HPI Tony Meyer is a 53 y.o. male with Chronic systolic heart failure, hypertension, DM2, CKD 3, obesity class I, COVID-19 in 2021, PVCs presents for evaluation of OSA  Respective notes from provider reviewed as appropriate to gather relevant information for patient care.   Discussed the use of AI scribe software for clinical note transcription with the patient, who gave verbal consent to proceed.  History of Present Illness   Tony Meyer is a 53 year old male with congestive heart failure and PVCs who presents for evaluation of sleep apnea. He was referred by his cardiologist for evaluation of sleep apnea.  He experiences difficulty with sleep, typically going to bed between 11 PM and 2 AM and waking up between 9 and 10 AM. He has a prolonged time to fall asleep and wakes up multiple times during the night, which he attributes to taking diuretics. Daytime naps last one to three hours, depending on his activities and medication schedule.  He denies snoring, as he sleeps alone and has not been told by others that he snores. He experiences dry mouth in the morning, denies morning headaches and waking up gasping or choking, and reports sometimes waking up with his mouth open. He sleeps in both side and back positions.  His past medical history includes congestive heart failure and premature ventricular contractions (PVCs). 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 He denies alcohol and tobacco use and does not consume coffee.      PRIOR TESTS and IMAGING: Echo 09/29/2023: EF 30-35%, LV moderately dilated, grade 1 diastolic dysfunction, RV size is mildly  enlarged.    Chest x-ray 04/28/2023 reviewed by me opacity seen in right lower lobe and left hilar region likely pulmonary edema.     03/23/2024    3:00 PM  Results of the Epworth flowsheet  Sitting and reading 2  Watching TV 3  Sitting, inactive in a public place (e.g. a theatre or a meeting) 3  As a passenger in a car for an hour without a break 1  Lying down to rest in the afternoon when circumstances permit 3  Sitting and talking to someone 1  Sitting quietly after a lunch without alcohol 2  In a car, while stopped for a few minutes in traffic 0  Total score 15    Allergies: Patient has no known allergies.  Current Outpatient Medications:    atorvastatin  (LIPITOR) 40 MG tablet, Take 1 tablet (40 mg total) by mouth daily., Disp: 90 tablet, Rfl: 3   dapagliflozin  propanediol (FARXIGA ) 10 MG TABS tablet, Take 1 tablet (10 mg total) by mouth daily., Disp: 30 tablet, Rfl: 1   lidocaine  (LIDODERM ) 5 %, Place 1 patch onto the skin daily. Remove & Discard patch within 12 hours or as directed by MD, Disp: 30 patch, Rfl: 1   metoprolol  succinate (TOPROL -XL) 50 MG 24 hr tablet, Take 1 tablet (50 mg total) by mouth daily. Take with or immediately following a meal., Disp: 90 tablet, Rfl: 3   Omega-3 Fatty Acids (OMEGA 3 FISH OIL ) 1000 MG CAPS, Take 1 capsule by mouth daily.,  Disp: 90 capsule, Rfl: 1   potassium chloride  SA (KLOR-CON  M) 20 MEQ tablet, Take 1 tablet (20 mEq total) by mouth daily., Disp: 30 tablet, Rfl: 5   sacubitril -valsartan  (ENTRESTO ) 49-51 MG, Take 1 tablet by mouth 2 (two) times daily., Disp: , Rfl:    spironolactone  (ALDACTONE ) 25 MG tablet, Take 1 tablet (25 mg total) by mouth daily., Disp: 30 tablet, Rfl: 6   torsemide  (DEMADEX ) 20 MG tablet, Take 1 tablet (20 mg total) by mouth daily., Disp: 90 tablet, Rfl: 3 Past Medical History:  Diagnosis Date   Acute systolic heart failure (HCC) 07/18/2020   AKI (acute kidney injury) 03/15/2020   Chronic kidney disease    Chronic  systolic heart failure (HCC) 11/09/2020   Elevated hemoglobin A1c measurement 04/26/2023   6.7% in 2022     Essential hypertension 03/15/2020   Nonischemic cardiomyopathy (HCC) 04/26/2023   Pneumonia due to COVID-19 virus 03/14/2020   Past Surgical History:  Procedure Laterality Date   HERNIA REPAIR     RIGHT HEART CATH N/A 04/28/2023   Procedure: RIGHT HEART CATH;  Surgeon: Gardenia Led, DO;  Location: [facility];  Service: Cardiovascular;  Laterality: N/A;   RIGHT/LEFT HEART CATH AND CORONARY ANGIOGRAPHY N/A 07/19/2020   Procedure: RIGHT/LEFT HEART CATH AND CORONARY ANGIOGRAPHY;  Surgeon: Cherrie Toribio SAUNDERS, MD;  Location: [facility];  Service: Cardiovascular;  Laterality: N/A;   Family History  Problem Relation Age of Onset   Heart failure Neg Hx    Social History   Socioeconomic History   Marital status: Single    Spouse name: Not on file   Number of children: Not on file   Years of education: Not on file   Highest education level: Not on file  Occupational History   Not on file  Tobacco Use   Smoking status: Never   Smokeless tobacco: Never  Vaping Use   Vaping status: Never Used  Substance and Sexual Activity   Alcohol use: Never   Drug use: Never   Sexual activity: Not on file  Other Topics Concern   Not on file  Social History Narrative   Not on file   Social Drivers of Health   Financial Resource Strain: Medium Risk (08/18/2023)   Overall Financial Resource Strain (CARDIA)    Difficulty of Paying Living Expenses: Somewhat hard  Food Insecurity: No Food Insecurity (04/26/2023)   Hunger Vital Sign    Worried About Running Out of Food in the Last Year: Never true    Ran Out of Food in the Last Year: Never true  Transportation Needs: No Transportation Needs (04/26/2023)   PRAPARE - Administrator, Civil Service (Medical): No    Lack of Transportation (Non-Medical): No  Physical Activity: Not on file  Stress: Not on file  Social  Connections: Not on file  Intimate Partner Violence: Not At Risk (04/26/2023)   Humiliation, Afraid, Rape, and Kick questionnaire    Fear of Current or Ex-Partner: No    Emotionally Abused: No    Physically Abused: No    Sexually Abused: No       Objective:  BP (!) 163/92 (BP Location: Right Arm, Patient Position: Sitting, Cuff Size: Large) Comment: 147/93 recheck MD aware  Pulse 74   Temp 98.3 F (36.8 C) (Oral)   Ht 6' 2 (1.88 m)   Wt 264 lb (119.7 kg)   SpO2 97%   BMI 33.90 kg/m  BMI Readings from Last 3 Encounters:  03/23/24 33.90 kg/m  03/09/24 33.00 kg/m  02/24/24 32.65 kg/m    Physical Exam: Physical Exam   ENT: Normal mucosa. No hypertrophy of inferior turbinates. Tonsils are normal sized. Modified Mallampati score is 4. Oral cavity normal. PULMONARY: Lungs clear to auscultation bilaterally, no adventitious breath sounds. CARDIOVASCULAR: Regular rate and rhythm, S1 S2 normal, no murmurs. ABDOMEN: Abdomen soft, nontender. Bowel sounds are normal. EXTREMITIES: No peripheral edema noted.       Diagnostic Review:  Last metabolic panel Lab Results  Component Value Date   GLUCOSE 122 (H) 03/09/2024   NA 141 03/09/2024   K 4.8 03/09/2024   CL 101 03/09/2024   CO2 27 03/09/2024   BUN 24 (H) 03/09/2024   CREATININE 1.84 (H) 03/09/2024   GFRNONAA 43 (L) 03/09/2024   CALCIUM  9.5 03/09/2024   PHOS 3.7 03/17/2020   PROT 7.5 02/18/2024   ALBUMIN 4.5 02/18/2024   LABGLOB 3.0 02/18/2024   BILITOT 0.4 02/18/2024   ALKPHOS 70 02/18/2024   AST 13 02/18/2024   ALT 17 02/18/2024   ANIONGAP 13 03/09/2024         Assessment & Plan:   Assessment & Plan Excessive daytime sleepiness  Orders:   Home sleep test; Future  Systolic congestive heart failure, unspecified HF chronicity (HCC)     Class 1 obesity Lifestyle changes to be continued.     Primary hypertension I discussed with the patient the pathophysiology of obstructive sleep apnea, its association  with weight, and its negative effects on hypertension, diabetes, mental health, A-fib, stroke if left untreated.   -cont metop, entresto , spirinolactone and torsemide .         Assessment and Plan    Sleep apnea evaluation Evaluated for sleep apnea due to daytime fatigue, nocturnal awakenings, and uncontrolled hypertension. Discussed impact on cardiovascular health and blood pressure. Explained CPAP therapy as a treatment option if confirmed. - Ordered HST.  - Provided instructions for sleep study device use. - Will discuss CPAP therapy if confirmed.       Notes from NP [facility] audiology reviewed as to gather relevant information for patient care and formulating plan.  He was counselled about not driving while drowsy which is common side effect of sleep related disorders.   Return for 1 mnth after Sleep study.   I personally spent a total of 30 minutes in the care of the patient today including preparing to see the patient, getting/reviewing separately obtained history, performing a medically appropriate exam/evaluation, counseling and educating, placing orders, documenting clinical information in the EHR, independently interpreting results, and communicating results.   Nettie Cromwell, MD

## 2024-04-23 ENCOUNTER — Encounter: Payer: Self-pay | Admitting: *Deleted

## 2024-04-28 ENCOUNTER — Encounter

## 2024-05-26 ENCOUNTER — Encounter

## 2024-06-10 ENCOUNTER — Ambulatory Visit

## 2024-06-17 ENCOUNTER — Ambulatory Visit: Admitting: Family Medicine

## 2024-06-17 ENCOUNTER — Ambulatory Visit: Admitting: Nurse Practitioner
